# Patient Record
Sex: Female | Born: 1970
Health system: Southern US, Community
[De-identification: ages and names within clinical notes are randomized; demographics above are authoritative.]

## PROBLEM LIST (undated history)

## (undated) DIAGNOSIS — R112 Nausea with vomiting, unspecified: Secondary | ICD-10-CM

## (undated) DIAGNOSIS — N63 Unspecified lump in unspecified breast: Secondary | ICD-10-CM

## (undated) DIAGNOSIS — Z87442 Personal history of urinary calculi: Secondary | ICD-10-CM

## (undated) DIAGNOSIS — Z923 Personal history of irradiation: Secondary | ICD-10-CM

## (undated) DIAGNOSIS — Z9221 Personal history of antineoplastic chemotherapy: Secondary | ICD-10-CM

## (undated) DIAGNOSIS — C801 Malignant (primary) neoplasm, unspecified: Secondary | ICD-10-CM

## (undated) DIAGNOSIS — F32A Depression, unspecified: Secondary | ICD-10-CM

## (undated) DIAGNOSIS — M1990 Unspecified osteoarthritis, unspecified site: Secondary | ICD-10-CM

## (undated) DIAGNOSIS — Z9289 Personal history of other medical treatment: Secondary | ICD-10-CM

## (undated) DIAGNOSIS — Z9889 Other specified postprocedural states: Secondary | ICD-10-CM

## (undated) DIAGNOSIS — D649 Anemia, unspecified: Secondary | ICD-10-CM

## (undated) DIAGNOSIS — Z1379 Encounter for other screening for genetic and chromosomal anomalies: Secondary | ICD-10-CM

## (undated) HISTORY — PX: ABDOMINAL HYSTERECTOMY: SHX81

## (undated) HISTORY — PX: DILATION AND CURETTAGE OF UTERUS: SHX78

## (undated) HISTORY — DX: Depression, unspecified: F32.A

## (undated) HISTORY — DX: Encounter for other screening for genetic and chromosomal anomalies: Z13.79

## (undated) HISTORY — DX: Personal history of irradiation: Z92.3

## (undated) HISTORY — PX: PORT-A-CATH REMOVAL: SHX5289

## (undated) HISTORY — PX: SMALL INTESTINE SURGERY: SHX150

---

## 1994-02-04 HISTORY — PX: TUBAL LIGATION: SHX77

## 1997-08-17 ENCOUNTER — Encounter: Admission: RE | Admit: 1997-08-17 | Discharge: 1997-08-17 | Payer: Self-pay | Admitting: *Deleted

## 2003-12-01 ENCOUNTER — Emergency Department (HOSPITAL_COMMUNITY): Admission: EM | Admit: 2003-12-01 | Discharge: 2003-12-01 | Payer: Self-pay | Admitting: Emergency Medicine

## 2006-08-14 ENCOUNTER — Emergency Department (HOSPITAL_COMMUNITY): Admission: EM | Admit: 2006-08-14 | Discharge: 2006-08-14 | Payer: Self-pay | Admitting: Emergency Medicine

## 2006-08-16 ENCOUNTER — Emergency Department (HOSPITAL_COMMUNITY): Admission: EM | Admit: 2006-08-16 | Discharge: 2006-08-17 | Payer: Self-pay | Admitting: Emergency Medicine

## 2007-10-19 ENCOUNTER — Emergency Department (HOSPITAL_COMMUNITY): Admission: EM | Admit: 2007-10-19 | Discharge: 2007-10-19 | Payer: Self-pay | Admitting: Emergency Medicine

## 2009-04-17 ENCOUNTER — Encounter: Admission: RE | Admit: 2009-04-17 | Discharge: 2009-04-17 | Payer: Self-pay | Admitting: Family Medicine

## 2010-02-24 ENCOUNTER — Encounter: Payer: Self-pay | Admitting: Family Medicine

## 2011-03-19 ENCOUNTER — Other Ambulatory Visit (HOSPITAL_COMMUNITY)
Admission: RE | Admit: 2011-03-19 | Discharge: 2011-03-19 | Disposition: A | Discharge status: Home / Self Care | Payer: BC Managed Care – PPO | Source: Ambulatory Visit | Attending: Family Medicine | Admitting: Family Medicine

## 2011-03-19 DIAGNOSIS — Z1159 Encounter for screening for other viral diseases: Secondary | ICD-10-CM | POA: Insufficient documentation

## 2011-03-19 DIAGNOSIS — Z01419 Encounter for gynecological examination (general) (routine) without abnormal findings: Secondary | ICD-10-CM | POA: Insufficient documentation

## 2016-02-05 DIAGNOSIS — C50919 Malignant neoplasm of unspecified site of unspecified female breast: Secondary | ICD-10-CM

## 2016-02-05 HISTORY — PX: BREAST LUMPECTOMY: SHX2

## 2016-02-05 HISTORY — DX: Malignant neoplasm of unspecified site of unspecified female breast: C50.919

## 2016-03-05 ENCOUNTER — Encounter: Payer: Self-pay | Admitting: Nurse Practitioner

## 2016-03-05 ENCOUNTER — Ambulatory Visit (INDEPENDENT_AMBULATORY_CARE_PROVIDER_SITE_OTHER): Payer: PRIVATE HEALTH INSURANCE | Admitting: Nurse Practitioner

## 2016-03-05 ENCOUNTER — Other Ambulatory Visit: Payer: PRIVATE HEALTH INSURANCE

## 2016-03-05 ENCOUNTER — Other Ambulatory Visit (INDEPENDENT_AMBULATORY_CARE_PROVIDER_SITE_OTHER): Payer: PRIVATE HEALTH INSURANCE

## 2016-03-05 VITALS — BP 124/82 | HR 67 | Temp 98.3°F | Ht 66.5 in | Wt 206.0 lb

## 2016-03-05 DIAGNOSIS — D649 Anemia, unspecified: Secondary | ICD-10-CM

## 2016-03-05 DIAGNOSIS — N6320 Unspecified lump in the left breast, unspecified quadrant: Secondary | ICD-10-CM | POA: Diagnosis not present

## 2016-03-05 DIAGNOSIS — Z0001 Encounter for general adult medical examination with abnormal findings: Secondary | ICD-10-CM

## 2016-03-05 DIAGNOSIS — Z23 Encounter for immunization: Secondary | ICD-10-CM

## 2016-03-05 DIAGNOSIS — N920 Excessive and frequent menstruation with regular cycle: Secondary | ICD-10-CM

## 2016-03-05 DIAGNOSIS — R748 Abnormal levels of other serum enzymes: Secondary | ICD-10-CM | POA: Diagnosis not present

## 2016-03-05 LAB — CBC WITH DIFFERENTIAL/PLATELET
Basophils Absolute: 0 10*3/uL (ref 0.0–0.1)
Basophils Relative: 0.8 % (ref 0.0–3.0)
Eosinophils Absolute: 0.2 10*3/uL (ref 0.0–0.7)
Eosinophils Relative: 2.5 % (ref 0.0–5.0)
HCT: 24 % — ABNORMAL LOW (ref 36.0–46.0)
Hemoglobin: 6.4 g/dL — CL (ref 12.0–15.0)
Lymphocytes Relative: 26.4 % (ref 12.0–46.0)
Lymphs Abs: 1.6 10*3/uL (ref 0.7–4.0)
MCHC: 27.3 g/dL — ABNORMAL LOW (ref 30.0–36.0)
MCV: 50.3 fl — ABNORMAL LOW (ref 78.0–100.0)
Monocytes Absolute: 0.6 10*3/uL (ref 0.1–1.0)
Monocytes Relative: 9.7 % (ref 3.0–12.0)
Neutro Abs: 3.7 10*3/uL (ref 1.4–7.7)
Neutrophils Relative %: 60.6 % (ref 43.0–77.0)
Platelets: 245 10*3/uL (ref 150.0–400.0)
RBC: 4.68 Mil/uL (ref 3.87–5.11)
RDW: 24.6 % — ABNORMAL HIGH (ref 11.5–15.5)
WBC: 6.2 10*3/uL (ref 4.0–10.5)

## 2016-03-05 LAB — HEPATIC FUNCTION PANEL
ALK PHOS: 45 U/L (ref 39–117)
ALT: 95 U/L — ABNORMAL HIGH (ref 0–35)
AST: 82 U/L — ABNORMAL HIGH (ref 0–37)
Albumin: 3.7 g/dL (ref 3.5–5.2)
Bilirubin, Direct: 0.3 mg/dL (ref 0.0–0.3)
TOTAL PROTEIN: 7 g/dL (ref 6.0–8.3)
Total Bilirubin: 1.1 mg/dL (ref 0.2–1.2)

## 2016-03-05 LAB — ANEMIA PANEL
%SAT: 3 % — ABNORMAL LOW (ref 11–50)
ABS Retic: 81720 cells/uL — ABNORMAL HIGH (ref 20000–80000)
Iron: 12 ug/dL — ABNORMAL LOW (ref 40–190)
RBC.: 4.54 MIL/uL (ref 3.80–5.10)
Retic Ct Pct: 1.8 %
TIBC: 347 ug/dL (ref 250–450)
UIBC: 335 ug/dL (ref 125–400)

## 2016-03-05 LAB — BASIC METABOLIC PANEL
BUN: 11 mg/dL (ref 6–23)
CO2: 26 mEq/L (ref 19–32)
Calcium: 8.8 mg/dL (ref 8.4–10.5)
Chloride: 109 mEq/L (ref 96–112)
Creatinine, Ser: 0.62 mg/dL (ref 0.40–1.20)
GFR: 134.22 mL/min (ref >60.00)
Glucose, Bld: 96 mg/dL (ref 70–99)
Potassium: 3.8 mEq/L (ref 3.5–5.1)
Sodium: 140 mEq/L (ref 135–145)

## 2016-03-05 LAB — LIPID PANEL
Cholesterol: 101 mg/dL (ref 0–200)
HDL: 37.6 mg/dL — ABNORMAL LOW (ref >39.00)
LDL Cholesterol: 43 mg/dL (ref 0–99)
NONHDL: 63.29
Total CHOL/HDL Ratio: 3
Triglycerides: 101 mg/dL (ref 0.0–149.0)
VLDL: 20.2 mg/dL (ref 0.0–40.0)

## 2016-03-05 LAB — TSH: TSH: 4.39 u[IU]/mL (ref 0.35–4.50)

## 2016-03-05 MED ORDER — DOCUSATE SODIUM 100 MG PO CAPS
100.0000 mg | ORAL_CAPSULE | Freq: Two times a day (BID) | ORAL | 3 refills | Status: DC
Start: 2016-03-05 — End: 2016-04-03

## 2016-03-05 MED ORDER — FERROUS SULFATE 325 (65 FE) MG PO TBEC: 325.0000 mg | DELAYED_RELEASE_TABLET | Freq: Three times a day (TID) | ORAL | 3 refills | Status: DC

## 2016-03-05 MED ORDER — WOMENS MULTI VITAMIN & MINERAL PO TABS: 1.0000 | ORAL_TABLET | Freq: Every day | ORAL | 3 refills | Status: DC

## 2016-03-05 NOTE — Progress Notes (Signed)
Pre visit review using our clinic review tool, if applicable. No additional management support is needed unless otherwise documented below in the visit note. 

## 2016-03-05 NOTE — Progress Notes (Signed)
Subjective:    Patient ID: Susan Morrison, female    DOB: Jul 03, 1970, 46 y.o.   MRN: [id number]  Patient presents today for complete physical or establish care (new patient) and evaluation od left breast mass.  HPI  Left Breast Mass: She is concerned about growing left breast mass x 23months. When first noted, it was size of pea per patient, now its size of grape. FH of breast cancer (grandmother at 60).   Menorrhagia: Patient denies any symptoms related to anemia (SOB, chest pain, fatigue, palpitations, dizziness or syncope).  she states she has heavy menstrual cycles which requires use of tampon and heavy pad. She changes every 2hrs. She has had 3 D&C procedures in past. Last dosne 2years ago. Denies any hx of endometriosis or uterine fibroids.  Immunizations: (TDAP, Hep C screen, Pneumovax, Influenza, zoster)  Health Maintenance  Topic Date Due  . Pap Smear  09/29/1992  . HIV Screening  03/07/2017*  . Tetanus Vaccine  02/05/2019  . Flu Shot  Completed  *Topic was postponed. The date shown is not the original due date.   Diet:regular Weight:  Wt Readings from Last 3 Encounters:  03/05/16 206 lb (93.4 kg)    Exercise:none Fall Risk: Fall Risk  03/05/2016  Falls in the past year? No   Home Safety:home with husband and children Depression/Suicide: Depression screen [facility] 2/9 03/05/2016  Decreased Interest 0  Down, Depressed, Hopeless 0  PHQ - 2 Score 0   No flowsheet data found. Pap Smear (every 32yrs for >21-29 without HPV, every 35yrs for >30-65yrs with HPV):needed Vision:needed Dental:needed Sexual History (birth control, marital status, STD):married, sexually active, tubal [phone], 3 children  Medications and allergies reviewed with patient and updated if appropriate.  Patient Active Problem List   Diagnosis Date Noted  . Menorrhagia with regular cycle 03/05/2016  . Anemia 03/05/2016  . Left breast mass 03/05/2016    No current outpatient prescriptions on  file prior to visit.   No current facility-administered medications on file prior to visit.     History reviewed. No pertinent past medical history.  Past Surgical History:  Procedure Laterality Date  . DILATION AND CURETTAGE OF UTERUS     last done 2years ago  . TUBAL LIGATION  1996    Social History   Social History  . Marital status: Married    Spouse name: N/A  . Number of children: N/A  . Years of education: N/A   Social History Main Topics  . Smoking status: Never Smoker  . Smokeless tobacco: Never Used  . Alcohol use Yes     Comment: wine/social  . Drug use: No  . Sexual activity: Not Asked   Other Topics Concern  . None   Social History Narrative  . None    Family History  Problem Relation Age of Onset  . Hypertension Mother   . Diabetes Mother   . Cancer Paternal Grandmother 31    breast cancer        Review of Systems  Constitutional: Negative for fever, malaise/fatigue and weight loss.  HENT: Negative for congestion and sore throat.   Eyes:       Negative for visual changes  Respiratory: Negative for cough and shortness of breath.   Cardiovascular: Negative for chest pain, palpitations and leg swelling.  Gastrointestinal: Negative for blood in stool, constipation, diarrhea and heartburn.  Genitourinary: Negative for dysuria, frequency and urgency.  Musculoskeletal: Negative for falls, joint pain and myalgias.  Skin: Negative  for rash.  Neurological: Negative for dizziness, sensory change and headaches.  Endo/Heme/Allergies: Does not bruise/bleed easily.  Psychiatric/Behavioral: Negative for depression, substance abuse and suicidal ideas. The patient is not nervous/anxious.     Objective:   Vitals:   03/05/16 0922  BP: 124/82  Pulse: 67  Temp: 98.3 F (36.8 C)    Body mass index is 32.75 kg/m.   Physical Examination:  Physical Exam  Constitutional: She is oriented to person, place, and time and well-developed, well-nourished,  and in no distress. No distress.  HENT:  Right Ear: External ear normal.  Left Ear: External ear normal.  Nose: Nose normal.  Mouth/Throat: Oropharynx is clear and moist. No oropharyngeal exudate.  Eyes: Conjunctivae and EOM are normal. Pupils are equal, round, and reactive to light. No scleral icterus.  Neck: Normal range of motion. Neck supple. No thyromegaly present.  Cardiovascular: Normal rate, normal heart sounds and intact distal pulses.   Pulmonary/Chest: Effort normal and breath sounds normal. She exhibits no tenderness. Right breast exhibits mass. Right breast exhibits no inverted nipple, no nipple discharge, no skin change and no tenderness. Left breast exhibits mass. Left breast exhibits no inverted nipple, no nipple discharge, no skin change and no tenderness. Breasts are symmetrical.    Abdominal: Soft. Bowel sounds are normal. She exhibits no distension. There is no tenderness.  Musculoskeletal: Normal range of motion. She exhibits no edema or tenderness.  Lymphadenopathy:    She has no cervical adenopathy.  Neurological: She is alert and oriented to person, place, and time. Gait normal.  Skin: Skin is warm and dry.  Psychiatric: Affect and judgment normal.    ASSESSMENT and PLAN:  Susan Morrison was seen today for establish care.  Diagnoses and all orders for this visit:  Encounter for preventative adult health care exam with abnormal findings -     CBC w/Diff; Future -     Basic metabolic panel; Future -     Hepatic function panel; Future -     TSH; Future -     Lipid panel; Future  Left breast mass -     MM DIAG BREAST TOMO BILATERAL; Future -     Cancel: MM DIAG BREAST TOMO UNI LEFT; Future -     US BREAST LTD UNI LEFT INC AXILLA; Future  Encounter for immunization -     MM DIAG BREAST TOMO BILATERAL; Future -     US BREAST LTD UNI LEFT INC AXILLA; Future -     CBC w/Diff; Future -     Basic metabolic panel; Future -     Hepatic function panel; Future -      TSH; Future -     Lipid panel; Future -     Flu Vaccine QUAD 36+ mos IM -     Anemia panel; Future -     Ambulatory referral to Gynecology -     CBC w/Diff; Future -     ferrous sulfate 325 (65 FE) MG EC tablet; Take 1 tablet (325 mg total) by mouth 3 (three) times daily with meals. -     docusate sodium (COLACE) 100 MG capsule; Take 1 capsule (100 mg total) by mouth 2 (two) times daily. -     Multiple Vitamins-Minerals (WOMENS MULTI VITAMIN & MINERAL) TABS; Take 1 tablet by mouth daily after breakfast.  Anemia, unspecified type -     Anemia panel; Future -     Ambulatory referral to Gynecology -     CBC w/Diff;  Future -     ferrous sulfate 325 (65 FE) MG EC tablet; Take 1 tablet (325 mg total) by mouth 3 (three) times daily with meals. -     docusate sodium (COLACE) 100 MG capsule; Take 1 capsule (100 mg total) by mouth 2 (two) times daily. -     Multiple Vitamins-Minerals (WOMENS MULTI VITAMIN & MINERAL) TABS; Take 1 tablet by mouth daily after breakfast.  Menorrhagia with regular cycle -     Ambulatory referral to Gynecology  Elevated liver enzymes -     Hepatic function panel; Future   No problem-specific Assessment & Plan notes found for this encounter.    Recent Results (from the past 2160 hour(s))  CBC w/Diff     Status: Abnormal   Collection Time: 03/05/16  9:59 AM  Result Value Ref Range   WBC 6.2 4.0 - 10.5 K/uL   RBC 4.68 3.87 - 5.11 Mil/uL   Hemoglobin 6.4 Repeated and verified X2. (LL) 12.0 - 15.0 g/dL   HCT 24.0 (L) 36.0 - 46.0 %   MCV 50.3 (L) 78.0 - 100.0 fl   MCHC 27.3 (L) 30.0 - 36.0 g/dL   RDW 24.6 (H) 11.5 - 15.5 %   Platelets 245.0 150.0 - 400.0 K/uL   Neutrophils Relative % 60.6 43.0 - 77.0 %   Lymphocytes Relative 26.4 12.0 - 46.0 %   Monocytes Relative 9.7 3.0 - 12.0 %   Eosinophils Relative 2.5 0.0 - 5.0 %   Basophils Relative 0.8 0.0 - 3.0 %   Neutro Abs 3.7 1.4 - 7.7 K/uL   Lymphs Abs 1.6 0.7 - 4.0 K/uL   Monocytes Absolute 0.6 0.1 - 1.0 K/uL     Eosinophils Absolute 0.2 0.0 - 0.7 K/uL   Basophils Absolute 0.0 0.0 - 0.1 K/uL  Basic metabolic panel     Status: None   Collection Time: 03/05/16  9:59 AM  Result Value Ref Range   Sodium 140 135 - 145 mEq/L   Potassium 3.8 3.5 - 5.1 mEq/L   Chloride 109 96 - 112 mEq/L   CO2 26 19 - 32 mEq/L   Glucose, Bld 96 70 - 99 mg/dL   BUN 11 6 - 23 mg/dL   Creatinine, Ser 0.62 0.40 - 1.20 mg/dL   Calcium 8.8 8.4 - 10.5 mg/dL   GFR 134.22 >60.00 mL/min  Hepatic function panel     Status: Abnormal   Collection Time: 03/05/16  9:59 AM  Result Value Ref Range   Total Bilirubin 1.1 0.2 - 1.2 mg/dL   Bilirubin, Direct 0.3 0.0 - 0.3 mg/dL   Alkaline Phosphatase 45 39 - 117 U/L   AST 82 (H) 0 - 37 U/L   ALT 95 (H) 0 - 35 U/L   Total Protein 7.0 6.0 - 8.3 g/dL   Albumin 3.7 3.5 - 5.2 g/dL  TSH     Status: None   Collection Time: 03/05/16  9:59 AM  Result Value Ref Range   TSH 4.39 0.35 - 4.50 uIU/mL  Lipid panel     Status: Abnormal   Collection Time: 03/05/16  9:59 AM  Result Value Ref Range   Cholesterol 101 0 - 200 mg/dL    Comment: ATP III Classification       Desirable:  < 200 mg/dL               Borderline High:  200 - 239 mg/dL          High:  > =  240 mg/dL   Triglycerides 101.0 0.0 - 149.0 mg/dL    Comment: Normal:  <150 mg/dLBorderline High:  150 - 199 mg/dL   HDL 37.60 (L) >39.00 mg/dL   VLDL 20.2 0.0 - 40.0 mg/dL   LDL Cholesterol 43 0 - 99 mg/dL   Total CHOL/HDL Ratio 3     Comment:                Men          Women1/2 Average Risk     3.4          3.3Average Risk          5.0          4.42X Average Risk          9.6          7.13X Average Risk          15.0          11.0                       NonHDL 63.29     Comment: NOTE:  Non-HDL goal should be 30 mg/dL higher than patient's LDL goal (i.e. LDL goal of < 70 mg/dL, would have non-HDL goal of < 100 mg/dL)   Follow up: Return in about 3 months (around 06/03/2016) for left breast mass and anemia.  Wilfred Lacy, NP

## 2016-03-05 NOTE — Patient Instructions (Addendum)
You will be called with breast US and mammogram.  She also denies going for 1unit of PRBCs as this time. She will like to use ferrous sulfate supplement (prescription sent) and iron rich diet at this time. She will return to lab in 2weeks for repeat CBC. She is to return to office sooner if develops any symptoms. She verbalized understanding and agreed with above plan.  Anemia, Nonspecific Anemia is a condition in which the concentration of red blood cells or hemoglobin in the blood is below normal. Hemoglobin is a substance in red blood cells that carries oxygen to the tissues of the body. Anemia results in not enough oxygen reaching these tissues. What are the causes? Common causes of anemia include:  Excessive bleeding. Bleeding may be internal or external. This includes excessive bleeding from periods (in women) or from the intestine.  Poor nutrition.  Chronic kidney, thyroid, and liver disease.  Bone marrow disorders that decrease red blood cell production.  Cancer and treatments for cancer.  HIV, AIDS, and their treatments.  Spleen problems that increase red blood cell destruction.  Blood disorders.  Excess destruction of red blood cells due to infection, medicines, and autoimmune disorders. What are the signs or symptoms?  Minor weakness.  Dizziness.  Headache.  Palpitations.  Shortness of breath, especially with exercise.  Paleness.  Cold sensitivity.  Indigestion.  Nausea.  Difficulty sleeping.  Difficulty concentrating. Symptoms may occur suddenly or they may develop slowly. How is this diagnosed? Additional blood tests are often needed. These help your health care provider determine the best treatment. Your health care provider will check your stool for blood and look for other causes of blood loss. How is this treated? Treatment varies depending on the cause of the anemia. Treatment can include:  Supplements of iron, vitamin 123456, or folic  acid.  Hormone medicines.  A blood transfusion. This may be needed if blood loss is severe.  Hospitalization. This may be needed if there is significant continual blood loss.  Dietary changes.  Spleen removal. Follow these instructions at home: Keep all follow-up appointments. It often takes many weeks to correct anemia, and having your health care provider check on your condition and your response to treatment is very important. Get help right away if:  You develop extreme weakness, shortness of breath, or chest pain.  You become dizzy or have trouble concentrating.  You develop heavy vaginal bleeding.  You develop a rash.  You have bloody or black, tarry stools.  You faint.  You vomit up blood.  You vomit repeatedly.  You have abdominal pain.  You have a fever or persistent symptoms for more than 2-3 days.  You have a fever and your symptoms suddenly get worse.  You are dehydrated. This information is not intended to replace advice given to you by your health care provider. Make sure you discuss any questions you have with your health care provider. Document Released: 02/29/2004 Document Revised: 07/05/2015 Document Reviewed: 07/17/2012 Elsevier Interactive Patient Education  2017 [employer].

## 2016-03-08 ENCOUNTER — Other Ambulatory Visit: Payer: PRIVATE HEALTH INSURANCE

## 2016-03-11 ENCOUNTER — Other Ambulatory Visit (HOSPITAL_COMMUNITY): Payer: Self-pay | Admitting: *Deleted

## 2016-03-11 DIAGNOSIS — N6320 Unspecified lump in the left breast, unspecified quadrant: Secondary | ICD-10-CM

## 2016-03-21 ENCOUNTER — Other Ambulatory Visit (HOSPITAL_COMMUNITY): Payer: Self-pay | Admitting: Obstetrics and Gynecology

## 2016-03-21 ENCOUNTER — Ambulatory Visit
Admission: RE | Admit: 2016-03-21 | Discharge: 2016-03-21 | Disposition: A | Discharge status: Home / Self Care | Payer: No Typology Code available for payment source | Source: Ambulatory Visit | Attending: Obstetrics and Gynecology | Admitting: Obstetrics and Gynecology

## 2016-03-21 ENCOUNTER — Encounter (HOSPITAL_COMMUNITY): Payer: Self-pay | Admitting: *Deleted

## 2016-03-21 ENCOUNTER — Ambulatory Visit (HOSPITAL_COMMUNITY)
Admission: RE | Admit: 2016-03-21 | Discharge: 2016-03-21 | Disposition: A | Discharge status: Home / Self Care | Payer: Medicaid Other | Source: Ambulatory Visit | Attending: Obstetrics and Gynecology | Admitting: Obstetrics and Gynecology

## 2016-03-21 VITALS — BP 118/82 | Temp 99.1°F | Ht 66.5 in | Wt 206.6 lb

## 2016-03-21 DIAGNOSIS — N6320 Unspecified lump in the left breast, unspecified quadrant: Secondary | ICD-10-CM

## 2016-03-21 DIAGNOSIS — R599 Enlarged lymph nodes, unspecified: Secondary | ICD-10-CM

## 2016-03-21 DIAGNOSIS — N6323 Unspecified lump in the left breast, lower outer quadrant: Secondary | ICD-10-CM | POA: Insufficient documentation

## 2016-03-21 DIAGNOSIS — N63 Unspecified lump in unspecified breast: Secondary | ICD-10-CM

## 2016-03-21 DIAGNOSIS — Z1239 Encounter for other screening for malignant neoplasm of breast: Secondary | ICD-10-CM

## 2016-03-21 HISTORY — DX: Unspecified lump in unspecified breast: N63.0

## 2016-03-21 NOTE — Progress Notes (Signed)
Complaints of left breast lump since October that has increased in size.  Pap Smear:  Pap smear not completed today. Last Pap smear was in April 2016 at the [facility] and normal per patient. Per patient has no history of an abnormal Pap smear. No Pap smear results are in EPIC.  Physical exam: Breasts Breasts symmetrical. No skin abnormalities bilateral breasts. No nipple retraction bilateral breasts. No nipple discharge bilateral breasts. No lymphadenopathy. No lumps palpated bright breast. Palpated a lump within the left breast between 7 and 9 o'clock. No complaints of pain or tenderness on exam. Referred patient to the [facility] for diagnostic mammogram and possible left breast ultrasound. Appointment scheduled for Thursday, Februrary 15, 2018 at 1500.        Pelvic/Bimanual No Pap smear completed today since last Pap smear was in April 2016 per patient. Pap smear not indicated per BCCCP guidelines.   Smoking History: Patient has never smoked.  Patient Navigation: Patient education provided. Access to services provided for patient through [facility] program.

## 2016-03-21 NOTE — Patient Instructions (Signed)
Explained breast self awareness with [name]. Patient did not need a Pap smear today due to last Pap smear was in April 2016 per patient. Let her know BCCCP will cover Pap smears every 3 years unless has a history of abnormal Pap smears. Referred patient to the [facility] for diagnostic mammogram and possible left breast ultrasound. Appointment scheduled for Thursday, Februrary 15, 2018 at 1500. [name] verbalized understanding.  Roshelle Traub, Arvil Chaco, RN 3:11 PM

## 2016-03-22 ENCOUNTER — Ambulatory Visit
Admission: RE | Admit: 2016-03-22 | Discharge: 2016-03-22 | Disposition: A | Discharge status: Home / Self Care | Payer: No Typology Code available for payment source | Source: Ambulatory Visit | Attending: Obstetrics and Gynecology | Admitting: Obstetrics and Gynecology

## 2016-03-22 ENCOUNTER — Encounter (HOSPITAL_COMMUNITY): Payer: Self-pay | Admitting: *Deleted

## 2016-03-22 DIAGNOSIS — R599 Enlarged lymph nodes, unspecified: Secondary | ICD-10-CM

## 2016-03-22 DIAGNOSIS — N6320 Unspecified lump in the left breast, unspecified quadrant: Secondary | ICD-10-CM

## 2016-03-26 ENCOUNTER — Telehealth: Payer: Self-pay | Admitting: *Deleted

## 2016-03-26 DIAGNOSIS — Z171 Estrogen receptor negative status [ER-]: Secondary | ICD-10-CM | POA: Insufficient documentation

## 2016-03-26 DIAGNOSIS — C50312 Malignant neoplasm of lower-inner quadrant of left female breast: Secondary | ICD-10-CM | POA: Insufficient documentation

## 2016-03-26 NOTE — Telephone Encounter (Signed)
Confirmed [facility] for 04/03/16 at 815am .  Instructions and contact information given.

## 2016-03-27 ENCOUNTER — Telehealth: Payer: Self-pay | Admitting: *Deleted

## 2016-03-27 NOTE — Telephone Encounter (Signed)
Mailed [facility] packet to pt. 

## 2016-04-03 ENCOUNTER — Ambulatory Visit (HOSPITAL_BASED_OUTPATIENT_CLINIC_OR_DEPARTMENT_OTHER): Payer: Medicaid Other | Admitting: Hematology and Oncology

## 2016-04-03 ENCOUNTER — Encounter: Payer: Self-pay | Admitting: *Deleted

## 2016-04-03 ENCOUNTER — Ambulatory Visit
Admission: RE | Admit: 2016-04-03 | Discharge: 2016-04-03 | Disposition: A | Discharge status: Home / Self Care | Payer: Medicaid Other | Source: Ambulatory Visit | Attending: Radiation Oncology | Admitting: Radiation Oncology

## 2016-04-03 ENCOUNTER — Telehealth: Payer: Self-pay | Admitting: Hematology and Oncology

## 2016-04-03 ENCOUNTER — Other Ambulatory Visit: Payer: Self-pay | Admitting: Hematology and Oncology

## 2016-04-03 ENCOUNTER — Encounter: Payer: Self-pay | Admitting: Hematology and Oncology

## 2016-04-03 ENCOUNTER — Ambulatory Visit: Payer: Medicaid Other | Attending: Surgery | Admitting: Physical Therapy

## 2016-04-03 ENCOUNTER — Encounter: Payer: Self-pay | Admitting: Physical Therapy

## 2016-04-03 ENCOUNTER — Other Ambulatory Visit: Payer: Self-pay | Admitting: Surgery

## 2016-04-03 ENCOUNTER — Other Ambulatory Visit (HOSPITAL_BASED_OUTPATIENT_CLINIC_OR_DEPARTMENT_OTHER): Payer: Medicaid Other

## 2016-04-03 VITALS — BP 138/75 | HR 84 | Temp 98.2°F | Resp 18 | Ht 66.5 in | Wt 206.4 lb

## 2016-04-03 DIAGNOSIS — C773 Secondary and unspecified malignant neoplasm of axilla and upper limb lymph nodes: Secondary | ICD-10-CM

## 2016-04-03 DIAGNOSIS — C50312 Malignant neoplasm of lower-inner quadrant of left female breast: Secondary | ICD-10-CM

## 2016-04-03 DIAGNOSIS — Z171 Estrogen receptor negative status [ER-]: Secondary | ICD-10-CM

## 2016-04-03 DIAGNOSIS — Z803 Family history of malignant neoplasm of breast: Secondary | ICD-10-CM | POA: Diagnosis not present

## 2016-04-03 DIAGNOSIS — R293 Abnormal posture: Secondary | ICD-10-CM

## 2016-04-03 DIAGNOSIS — D500 Iron deficiency anemia secondary to blood loss (chronic): Secondary | ICD-10-CM | POA: Insufficient documentation

## 2016-04-03 LAB — COMPREHENSIVE METABOLIC PANEL
ALK PHOS: 54 U/L (ref 40–150)
ALT: 106 U/L — AB (ref 0–55)
ANION GAP: 9 meq/L (ref 3–11)
AST: 87 U/L — ABNORMAL HIGH (ref 5–34)
Albumin: 3.6 g/dL (ref 3.5–5.0)
BILIRUBIN TOTAL: 1.43 mg/dL — AB (ref 0.20–1.20)
BUN: 10.1 mg/dL (ref 7.0–26.0)
CALCIUM: 9.1 mg/dL (ref 8.4–10.4)
CO2: 24 mEq/L (ref 22–29)
CREATININE: 0.8 mg/dL (ref 0.6–1.1)
Chloride: 107 mEq/L (ref 98–109)
Glucose: 102 mg/dl (ref 70–140)
Potassium: 4.1 mEq/L (ref 3.5–5.1)
Sodium: 140 mEq/L (ref 136–145)
Total Protein: 7.3 g/dL (ref 6.4–8.3)

## 2016-04-03 LAB — CBC WITH DIFFERENTIAL/PLATELET
BASO%: 0.3 % (ref 0.0–2.0)
BASOS ABS: 0 10*3/uL (ref 0.0–0.1)
EOS ABS: 0.2 10*3/uL (ref 0.0–0.5)
EOS%: 2.9 % (ref 0.0–7.0)
HEMATOCRIT: 27 % — AB (ref 34.8–46.6)
HGB: 7.3 g/dL — ABNORMAL LOW (ref 11.6–15.9)
LYMPH%: 23.6 % (ref 14.0–49.7)
MCH: 14.9 pg — ABNORMAL LOW (ref 25.1–34.0)
MCHC: 27 g/dL — ABNORMAL LOW (ref 31.5–36.0)
MCV: 55 fL — AB (ref 79.5–101.0)
MONO#: 0.7 10*3/uL (ref 0.1–0.9)
MONO%: 11.3 % (ref 0.0–14.0)
NEUT%: 61.9 % (ref 38.4–76.8)
NEUTROS ABS: 3.6 10*3/uL (ref 1.5–6.5)
NRBC: 1 % — AB (ref 0–0)
PLATELETS: 431 10*3/uL — AB (ref 145–400)
RBC: 4.91 10*6/uL (ref 3.70–5.45)
RDW: 23.8 % — AB (ref 11.2–14.5)
WBC: 5.8 10*3/uL (ref 3.9–10.3)
lymph#: 1.4 10*3/uL (ref 0.9–3.3)

## 2016-04-03 LAB — TECHNOLOGIST REVIEW

## 2016-04-03 MED ORDER — PROCHLORPERAZINE MALEATE 10 MG PO TABS: 10.0000 mg | ORAL_TABLET | Freq: Four times a day (QID) | ORAL | 1 refills | Status: DC | PRN

## 2016-04-03 MED ORDER — LIDOCAINE-PRILOCAINE 2.5-2.5 % EX CREA: TOPICAL_CREAM | CUTANEOUS | 3 refills | Status: DC

## 2016-04-03 MED ORDER — DEXAMETHASONE 4 MG PO TABS: 4.0000 mg | ORAL_TABLET | Freq: Every day | ORAL | 1 refills | Status: AC

## 2016-04-03 MED ORDER — LORAZEPAM 0.5 MG PO TABS: 0.5000 mg | ORAL_TABLET | Freq: Four times a day (QID) | ORAL | 0 refills | Status: DC | PRN

## 2016-04-03 MED ORDER — ONDANSETRON HCL 8 MG PO TABS: 8.0000 mg | ORAL_TABLET | Freq: Two times a day (BID) | ORAL | 1 refills | Status: DC | PRN

## 2016-04-03 NOTE — Progress Notes (Signed)
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 female who self palpated a lump in the left breast since October 2017. She had a diagnostic bilateral mammogram on 03/21/16. This showed an irregular mass in the LIQ of the left breast posterior depth and a partially imaged mass in the left axilla. Ultrasound of the left breast revealed an irregular hypoechoic mass in the 7:30 position 4 cm from the nipple measuring 3.5 x 2.2 x 2.6 cm corresponding to the palpable lump and mammographic finding. Ultrasound of the left axilla showed an enlarged/abnormal lymph node measuring 2.8 x 2.4 cm corresponding to the mammographic finding. There was no malignancies noted in the right breast. Biopsies were obtained on 03/22/16. Biopsy of the left breast 8:00 position revealed high grade IDC with necrosis.(triple negative, Ki67 90%) Biopsy of a left axillary lymph node revealed IDC . The patient presents today in multidisciplinary breast clinic to discuss treatment options for the management of her disease.  Gynecologic History  Age at first menstrual period? 13  Are you still having periods? Yes Approximate date of last period? 03/28/16 - 04/02/26  If you are still having periods: Are your periods regular? No Obstetric History:  How many children have you carried to term? 3 Your age at first live birth? 85  Pregnant now or trying to get pregnant? No  Have you used birth control pills or hormone shots for contraception? No  Would you be interested in learning more about the options to preserve fertility? No Health  Maintenance:  Have you ever had a colonoscopy? No  Have you ever had a bone density? No  Date of your last PAP smear? 05/2014 Date of your FIRST mammogram? 03/21/16  PREVIOUS RADIATION THERAPY: No  PAST MEDICAL HISTORY:  has a past medical history of Breast mass (03/21/2016).    PAST SURGICAL HISTORY: Past Surgical History:  Procedure Laterality Date  . DILATION AND CURETTAGE OF UTERUS     last done 2years ago  . TUBAL LIGATION  1996    FAMILY HISTORY: family history includes Cancer (age of onset: 30) in her paternal grandmother; Diabetes in her mother; Hypertension in her mother.  SOCIAL HISTORY:  reports that she has never smoked. She has never used smokeless tobacco. She reports that she drinks alcohol. She reports that she does not use drugs.  ALLERGIES: Patient has no known allergies.  MEDICATIONS:  Current Outpatient Prescriptions  Medication Sig Dispense Refill  . Multiple Vitamins-Minerals (WOMENS MULTI VITAMIN & MINERAL) TABS Take 1 tablet by mouth daily after breakfast. 30 tablet 3   No current facility-administered medications for this encounter.     REVIEW OF SYSTEMS: On review of systems the patient reports night sweats, weight change, loss of sleep, left breast and back pain, poor circulation, sleeps on more than one pillow, poor appetite, a left breast lump, arthritis, and hot flashes. The patient denies issues with her eyes, ENT, bowels, urine, skin, nervous system, mental health, blood/lymph system, or immune system. Pertinent items noted in HPI and remainder of comprehensive ROS otherwise negative.   PHYSICAL EXAM:  Vitals with BMI 04/03/2016  Height  5' 6.5"  Weight 206 lbs 6 oz  BMI 17.4  Systolic 944  Diastolic 75  Pulse 84  Respirations 18  General: Alert and oriented, in no acute distress HEENT: Head is normocephalic. Extraocular movements are intact. Oropharynx is clear. Neck: Neck is supple, no palpable cervical or supraclavicular  lymphadenopathy. Heart: Regular in rate and rhythm with no murmurs, rubs, or gallops. Chest: Clear to auscultation bilaterally, with no rhonchi, wheezes, or rales. Extremities: No cyanosis or edema. Lymphatics: see Neck Exam. The patient has a 1.5 x 2.0 cm palpable lymph node in the left axilla. Skin: No concerning lesions. Musculoskeletal: symmetric strength and muscle tone throughout. Neurologic: Cranial nerves II through XII are grossly intact. No obvious focalities. Speech is fluent. Coordination is intact. Psychiatric: Judgment and insight are intact. Affect is appropriate. Breast: Right breast no palpable mass or nipple discharge. Left breast 3.5 x 3.5 cm mass in the approximately 8:00 position of the breast, best appreciated when the patient is recumbent.  ECOG = 1   LABORATORY DATA:  Lab Results  Component Value Date   WBC 5.8 04/03/2016   HGB 7.3 (L) 04/03/2016   HCT 27.0 (L) 04/03/2016   MCV 55.0 (L) 04/03/2016   PLT 431 (H) 04/03/2016   NEUTROABS 3.6 04/03/2016   Lab Results  Component Value Date   NA 140 04/03/2016   K 4.1 04/03/2016   CL 109 03/05/2016   CO2 24 04/03/2016   GLUCOSE 102 04/03/2016   CREATININE 0.8 04/03/2016   CALCIUM 9.1 04/03/2016      RADIOGRAPHY: US Breast Ltd Uni Left Inc Axilla  Result Date: 03/21/2016 CLINICAL DATA:  Left breast lump since October of 2017, enlarging per patient. This is patient's baseline mammogram. EXAM: 2D DIGITAL DIAGNOSTIC BILATERAL MAMMOGRAM WITH CAD AND ADJUNCT TOMO ULTRASOUND RIGHT BREAST COMPARISON:  None. ACR Breast Density Category c: The breast tissue is heterogeneously dense, which may obscure small masses. FINDINGS: Bilateral 2D CC and MLO projections were obtained today, with additional 3D tomosynthesis, and with additional spot compression view of the area of clinical concern with overlying skin marker in place. There is a partially imaged irregular mass within the lower inner quadrant of the left breast, at far  posterior depth. There are no additional masses, suspicious calcifications or secondary signs of malignancy within either breast. Also noted is a partially imaged mass within the left axilla. Mammographic images were processed with CAD. Targeted ultrasound is performed, showing an irregular hypoechoic mass within the left breast at the 7:30 o'clock axis, 4 cm from the nipple, measuring 3.5 x 2.2 x 2.6 cm, with internal vascularity, corresponding to the palpable lump and mammographic finding. Left axilla was evaluated with ultrasound showing an enlarged/morphologically abnormal lymph node measuring 2.8 x 2.4 cm, corresponding to the mammographic finding. Additional mildly prominent lymph nodes are noted within the left axilla, none of which appear pathologic by size or morphology criteria. IMPRESSION: 1. Irregular mass within the LEFT breast at the 7:30 o'clock axis, 4 cm from the nipple, at posterior depth, measuring 3.5 x 2.2 x 2.6 cm, corresponding to the palpable lump and mammographic finding. This is a highly suspicious finding for which ultrasound-guided biopsy is recommended. 2. Enlarged/morphologically abnormal lymph node in the LEFT axilla. This is also a highly suspicious finding for which ultrasound-guided biopsy is recommended. 3. No evidence of malignancy within the right breast. RECOMMENDATION: 1. Ultrasound-guided biopsy of the LEFT breast mass at the 7:30 o'clock axis. 2. Ultrasound-guided biopsy of the enlarged lymph node  in the LEFT axilla. Ultrasound-guided biopsies are scheduled for 03/22/2016 at 7:30 a.m. I have discussed the findings and recommendations with the patient. Results were also provided in writing at the conclusion of the visit. If applicable, a reminder letter will be sent to the patient regarding the next appointment. BI-RADS CATEGORY  5: Highly suggestive of malignancy. Electronically Signed   By: Franki Cabot M.D.   On: 03/21/2016 15:44   Mm Diag Breast Tomo Bilateral  Result  Date: 03/21/2016 CLINICAL DATA:  Left breast lump since October of 2017, enlarging per patient. This is patient's baseline mammogram. EXAM: 2D DIGITAL DIAGNOSTIC BILATERAL MAMMOGRAM WITH CAD AND ADJUNCT TOMO ULTRASOUND RIGHT BREAST COMPARISON:  None. ACR Breast Density Category c: The breast tissue is heterogeneously dense, which may obscure small masses. FINDINGS: Bilateral 2D CC and MLO projections were obtained today, with additional 3D tomosynthesis, and with additional spot compression view of the area of clinical concern with overlying skin marker in place. There is a partially imaged irregular mass within the lower inner quadrant of the left breast, at far posterior depth. There are no additional masses, suspicious calcifications or secondary signs of malignancy within either breast. Also noted is a partially imaged mass within the left axilla. Mammographic images were processed with CAD. Targeted ultrasound is performed, showing an irregular hypoechoic mass within the left breast at the 7:30 o'clock axis, 4 cm from the nipple, measuring 3.5 x 2.2 x 2.6 cm, with internal vascularity, corresponding to the palpable lump and mammographic finding. Left axilla was evaluated with ultrasound showing an enlarged/morphologically abnormal lymph node measuring 2.8 x 2.4 cm, corresponding to the mammographic finding. Additional mildly prominent lymph nodes are noted within the left axilla, none of which appear pathologic by size or morphology criteria. IMPRESSION: 1. Irregular mass within the LEFT breast at the 7:30 o'clock axis, 4 cm from the nipple, at posterior depth, measuring 3.5 x 2.2 x 2.6 cm, corresponding to the palpable lump and mammographic finding. This is a highly suspicious finding for which ultrasound-guided biopsy is recommended. 2. Enlarged/morphologically abnormal lymph node in the LEFT axilla. This is also a highly suspicious finding for which ultrasound-guided biopsy is recommended. 3. No evidence of  malignancy within the right breast. RECOMMENDATION: 1. Ultrasound-guided biopsy of the LEFT breast mass at the 7:30 o'clock axis. 2. Ultrasound-guided biopsy of the enlarged lymph node in the LEFT axilla. Ultrasound-guided biopsies are scheduled for 03/22/2016 at 7:30 a.m. I have discussed the findings and recommendations with the patient. Results were also provided in writing at the conclusion of the visit. If applicable, a reminder letter will be sent to the patient regarding the next appointment. BI-RADS CATEGORY  5: Highly suggestive of malignancy. Electronically Signed   By: Franki Cabot M.D.   On: 03/21/2016 15:44   Mm Clip Placement Left  Result Date: 03/22/2016 CLINICAL DATA:  Status post ultrasound-guided core biopsy of a mass in the 8 o'clock location of the left breast and enlarged left axillary lymph node. EXAM: DIAGNOSTIC LEFT MAMMOGRAM POST ULTRASOUND BIOPSY x2 COMPARISON:  Previous exam(s). FINDINGS: Mammographic images were obtained following ultrasound guided biopsy of mass in the 8 o'clock location of the left breast and placement of a ribbon shaped clip. A ribbon shaped clip is identified within the mass in the lower inner quadrant of the left breast. Following biopsy of enlarged left axillary lymph node, a spiral shaped clip was placed. Images demonstrate post biopsy changes in the left axilla but fail to demonstrate the clip given  the deep location of the enlarged node. IMPRESSION: 1. Tissue marker clip well seen within the lower inner quadrant of the left breast. 2. Axillary clip is not imaged mammographically. Final Assessment: Post Procedure Mammograms for Marker Placement Electronically Signed   By: Nolon Nations M.D.   On: 03/22/2016 08:39   [place] Lt Breast Bx W Loc Dev 1st Lesion Img Bx Spec US Guide  Addendum Date: 04/01/2016   ADDENDUM REPORT: 03/26/2016 08:38 ADDENDUM: Pathology revealed INVASIVE DUCTAL CARCINOMA, HIGH GRADE, WITH NECROSIS of the Left breast, 8:00 o'clock.  INVASIVE DUCTAL CARCINOMA METASTATIC TO LYMPH NODE of the Left axilla. This was found to be concordant by Dr. Nolon Nations. Pathology results were discussed with the patient by telephone. The patient reported doing well after the biopsies with tenderness at the sites. Post biopsy instructions and care were reviewed and questions were answered. The patient was encouraged to call [facility] for any additional concerns. The patient was referred to [facility] on April 03, 2016. Pathology results reported by Terie Purser, RN on 03/26/2016. Electronically Signed   By: Nolon Nations M.D.   On: 03/26/2016 08:38   Result Date: 04/01/2016 CLINICAL DATA:  Patient presents for ultrasound-guided core biopsy of a left breast and enlarged left axillary lymph node. EXAM: ULTRASOUND GUIDED LEFT BREAST CORE NEEDLE BIOPSY x2 COMPARISON:  Previous exam(s). FINDINGS: I met with the patient and we discussed the procedure of ultrasound-guided biopsy, including benefits and alternatives. We discussed the high likelihood of a successful procedure. We discussed the risks of the procedure, including infection, bleeding, tissue injury, clip migration, and inadequate sampling. Informed written consent was given. The usual time-out protocol was performed immediately prior to the procedure. Using sterile technique and 1% Lidocaine as local anesthetic, under direct ultrasound visualization, a 12 gauge spring-loaded device was used to perform biopsy of mass in the 8 o'clock location of the left breast using a lateral approach. At the conclusion of the procedure a ribbon shaped tissue marker clip was deployed into the biopsy cavity. Using the same technique, a 14 gauge spring-loaded device was used to perform biopsy of mass in the lower left axilla using a lateral approach. At the conclusion of procedure a HydroMARK spiral shaped  tissue marker clip was deployed into the biopsy cavity. Follow up 2 view mammogram was performed and dictated separately. IMPRESSION: Ultrasound guided biopsy of left breast mass and enlarged left axillary lymph node. No apparent complications. Electronically Signed: By: Nolon Nations M.D. On: 03/22/2016 08:33   [place] Lt Breast Bx W Loc Dev Ea Add Lesion Img Bx Spec US Guide  Addendum Date: 04/01/2016   ADDENDUM REPORT: 03/26/2016 08:38 ADDENDUM: Pathology revealed INVASIVE DUCTAL CARCINOMA, HIGH GRADE, WITH NECROSIS of the Left breast, 8:00 o'clock. INVASIVE DUCTAL CARCINOMA METASTATIC TO LYMPH NODE of the Left axilla. This was found to be concordant by Dr. Nolon Nations. Pathology results were discussed with the patient by telephone. The patient reported doing well after the biopsies with tenderness at the sites. Post biopsy instructions and care were reviewed and questions were answered. The patient was encouraged to call [facility] for any additional concerns. The patient was referred to [facility] at [facility] on April 03, 2016. Pathology results reported by Terie Purser, RN on 03/26/2016. Electronically Signed   By: Nolon Nations M.D.   On: 03/26/2016 08:38  Result Date: 04/01/2016 CLINICAL DATA:  Patient presents for ultrasound-guided core biopsy of a left breast and enlarged left axillary lymph node. EXAM: ULTRASOUND GUIDED LEFT BREAST CORE NEEDLE BIOPSY x2 COMPARISON:  Previous exam(s). FINDINGS: I met with the patient and we discussed the procedure of ultrasound-guided biopsy, including benefits and alternatives. We discussed the high likelihood of a successful procedure. We discussed the risks of the procedure, including infection, bleeding, tissue injury, clip migration, and inadequate sampling. Informed written consent was given. The usual time-out protocol was performed immediately prior to the  procedure. Using sterile technique and 1% Lidocaine as local anesthetic, under direct ultrasound visualization, a 12 gauge spring-loaded device was used to perform biopsy of mass in the 8 o'clock location of the left breast using a lateral approach. At the conclusion of the procedure a ribbon shaped tissue marker clip was deployed into the biopsy cavity. Using the same technique, a 14 gauge spring-loaded device was used to perform biopsy of mass in the lower left axilla using a lateral approach. At the conclusion of procedure a HydroMARK spiral shaped tissue marker clip was deployed into the biopsy cavity. Follow up 2 view mammogram was performed and dictated separately. IMPRESSION: Ultrasound guided biopsy of left breast mass and enlarged left axillary lymph node. No apparent complications. Electronically Signed: By: Nolon Nations M.D. 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 Radiation to the left breast and left axilla would be given post-surgically to reduce the risk of recurrence.  I spoke to the patient today regarding her diagnosis and options for treatment. We discussed the equivalence in terms of survival and local failure between mastectomy and breast conservation. We discussed the role of radiation in decreasing local failures in patients who undergo lumpectomy. We discussed the process of simulation and the placement tattoos. We discussed 4-6 weeks of treatment as an outpatient. We discussed the possibility of asymptomatic lung damage. We discussed the low likelihood of secondary malignancies. We discussed the possible side effects including but not limited to skin redness, fatigue, permanent skin darkening, and breast swelling.  We discussed the use of cardiac sparing with  deep inspiration breath hold if needed.  PLAN: She will undergo a bilateral breast MRI to observe the extent of disease and this is scheduled on 04/05/16. The patient will be referred to genetic counseling given her young age and having triple negative disease and this is scheduled on 05/15/16. She will have an echocardiogram, chemo class, and placement of a port. The patient will then undergo neoadjuvant chemotherapy to shrink the size of the tumor and lymphadenopathy. Surgery will be determined based on the results of neoadjuvant chemotherapy. The patient will see me after surgery to further discuss radiation to the left breast and left axilla.     ------------------------------------------------  Blair Promise, PhD, MD  This document serves as a record of services personally performed by Gery Pray, MD. It was created on his behalf by Darcus Austin, a trained medical scribe. The creation of this record is based on the scribe's personal observations and the provider's statements to them. This document has been checked and approved by the attending provider.

## 2016-04-03 NOTE — Patient Instructions (Signed)

## 2016-04-03 NOTE — Therapy (Signed)
[place], [place], [address] Phone: [phone]   Fax:  [phone]  Physical Therapy Evaluation  Patient Details  Name: Susan Morrison MRN: [id number] Date of Birth: April 07, 1970 Referring Provider: Dr. Alphonsa Overall  Encounter Date: 04/03/2016      PT End of Session - 04/03/16 1201    Visit Number 1   Number of Visits 1   PT Start Time 8329   PT Stop Time 1916  Also saw pt from [phone] for a total of 21 minutes   PT Time Calculation (min) 7 min   Activity Tolerance Patient tolerated treatment well   Behavior During Therapy [facility] for tasks assessed/performed      Past Medical History:  Diagnosis Date  . Breast mass 03/21/2016   left 7:00    Past Surgical History:  Procedure Laterality Date  . DILATION AND CURETTAGE OF UTERUS     last done 2years ago  . TUBAL LIGATION  1996    There were no vitals filed for this visit.       Subjective Assessment - 04/03/16 1126    Subjective Patient reports she is here today to be seen by her medical team for her newly diagnosed left breast cancer.   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 It measures 3.5 cm and is located in the lower inner quadrant. The Ki67 is 90% and she has a positive axillary lymph node.   Patient Stated Goals Reduce lymphedema risk and learn post op shoulder ROM HEP   Currently in Pain? No/denies            [facility] PT Assessment - 04/03/16 0001      Assessment   Medical Diagnosis Left breast cancer   Referring Provider Dr. Alphonsa Overall   Onset Date/Surgical Date 03/21/16   Hand Dominance Right   Prior Therapy none     Precautions   Precautions Other (comment)   Precaution Comments active cancer     Restrictions   Weight Bearing Restrictions No     Balance Screen   Has the patient fallen in the past 6 months No   Has  the patient had a decrease in activity level because of a fear of falling?  No   Is the patient reluctant to leave their home because of a fear of falling?  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   Leisure She does not exercise     Cognition   Overall Cognitive Status Within Functional Limits for tasks assessed     Posture/Postural Control   Posture/Postural Control Postural limitations   Postural Limitations Rounded Shoulders;Forward head     ROM / Strength   AROM / PROM / Strength AROM;Strength     AROM   AROM Assessment Site Shoulder;Cervical   Right/Left Shoulder Left;Right   Right Shoulder Extension 51 Degrees   Right Shoulder Flexion 148 Degrees   Right Shoulder ABduction 156 Degrees   Right Shoulder Internal Rotation 67 Degrees   Right Shoulder External Rotation 76 Degrees   Left Shoulder Extension 60 Degrees   Left Shoulder Flexion 150 Degrees   Left  Shoulder ABduction 149 Degrees   Left Shoulder Internal Rotation 72 Degrees   Left Shoulder External Rotation 77 Degrees   Cervical Flexion WNL   Cervical Extension WNL   Cervical - Right Side Bend WNL   Cervical - Left Side Bend WNL   Cervical - Right Rotation WNL   Cervical - Left Rotation WNL     Strength   Overall Strength Within functional limits for tasks performed           LYMPHEDEMA/ONCOLOGY QUESTIONNAIRE - 04/03/16 1155      Type   Cancer Type Left breast cancer     Lymphedema Assessments   Lymphedema Assessments Upper extremities     Right Upper Extremity Lymphedema   10 cm Proximal to Olecranon Process 34.9 cm   Olecranon Process 27.1 cm   10 cm Proximal to Ulnar Styloid Process 26 cm   Just Proximal to Ulnar Styloid Process 18.1 cm    Across Hand at Thumb Web Space 19.2 cm   At Base of 2nd Digit 6.4 cm     Left Upper Extremity Lymphedema   10 cm Proximal to Olecranon Process 35.5 cm   Olecranon Process 27.6 cm   10 cm Proximal to Ulnar Styloid Process 25 cm   Just Proximal to Ulnar Styloid Process 17.9 cm   Across Hand at Thumb Web Space 19.4 cm   At Base of 2nd Digit 6.2 cm      Patient was instructed today in a home exercise program today for post op shoulder range of motion. These included active assist shoulder flexion in sitting, scapular retraction, wall walking with shoulder abduction, and hands behind head external rotation.  She was encouraged to do these twice a day, holding 3 seconds and repeating 5 times when permitted by her physician.         PT Education - 04/03/16 1200    Education provided Yes   Education Details Lymphedema risk reduction and post op shoulder ROM HEP   Person(s) Educated Patient;Caregiver(s)  Mother, daughter, and boyfriend   Methods Explanation;Demonstration;Handout   Comprehension Returned demonstration;Verbalized understanding              Breast Clinic Goals - 04/03/16 1208      Patient will be able to verbalize understanding of pertinent lymphedema risk reduction practices relevant to her diagnosis specifically related to skin care.   Time 1   Period Days   Status Achieved     Patient will be able to return demonstrate and/or verbalize understanding of the post-op home exercise program related to regaining shoulder range of motion.   Time 1   Period Days   Status Achieved     Patient will be able to verbalize understanding of the importance of attending the postoperative After Breast Cancer Class for further lymphedema risk reduction education and therapeutic exercise.   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  It measures 3.5 cm and is located in the lower inner quadrant. The Ki67 is 90% and she has a positive axillary lymph node. Her multidisciplinary medical team met prior to her assessments to determine a recommended treatment plan. She is planning to have neoadjuvant chemotherapy followed by either a left lumpectomy or mastectomy and either a targeted axillary node dissection or a complete   axillary node dissection. She will then undergo radiation to her left breast and axillary region. She will benefit from post op PT to regain shoulder ROM and reduce lymphedema risk as she will be at significant risk. Due to her lack of comorbidities, her eval is of low complexity.   Rehab Potential Excellent   Clinical Impairments Affecting Rehab Potential none   PT Frequency One time visit   PT Treatment/Interventions Patient/family education;Therapeutic exercise   PT Next Visit Plan Will f/u after surgery to determine PT needs   PT Home Exercise Plan Post op shoulder ROM HEP   Consulted and Agree with Plan of Care Patient;Family member/caregiver   Family Member Consulted Mother, daughter, boyfriend      Patient will benefit from skilled therapeutic intervention in order to improve the following deficits and impairments:  Postural dysfunction, Decreased knowledge of precautions, Impaired UE functional use, Decreased range of motion  Visit Diagnosis: Carcinoma of lower-inner quadrant of left breast in female, estrogen receptor negative (HCC) - Plan: PT plan of care cert/re-cert  Abnormal posture - Plan: PT plan of care cert/re-cert   Patient will follow up at outpatient cancer rehab if needed following surgery.  If the patient requires physical therapy at that time, a specific plan will be dictated and sent to the referring physician for approval. The patient was educated today on appropriate basic range of motion exercises to begin post operatively and the importance of attending the After Breast Cancer class  following surgery.  Patient was educated today on lymphedema risk reduction practices as it pertains to recommendations that will benefit the patient immediately following surgery.  She verbalized good understanding.  No additional physical therapy is indicated at this time.     Problem List Patient Active Problem List   Diagnosis Date Noted  . Malignant neoplasm of lower-inner quadrant of left breast in female, estrogen receptor negative (HCC) 03/26/2016  . Menorrhagia with regular cycle 03/05/2016  . Anemia 03/05/2016  . Left breast mass 03/05/2016  . Elevated liver enzymes 03/05/2016    Marti Cooper Smith, PT 04/03/16 12:10 PM  [facility] [address] [place], [place], [address] Phone: [phone]   Fax:  [phone]  Name: Susan Morrison MRN: [id number] Date of Birth: 04/06/1970  

## 2016-04-03 NOTE — Progress Notes (Signed)
START ON PATHWAY REGIMEN - Breast   Doxorubicin + Cyclophosphamide (AC):   A cycle is every 21 days:     Doxorubicin        Dose Mod: None     Cyclophosphamide        Dose Mod: None  **Always confirm dose/schedule in your pharmacy ordering system**    Paclitaxel 80 mg/m2 Weekly:   Administer weekly:     Paclitaxel        Dose Mod: None  **Always confirm dose/schedule in your pharmacy ordering system**    Patient Characteristics: Neoadjuvant Chemotherapy, HER2/neu Negative/Unknown/Equivocal, ER Negative, Platinum Therapy Not Indicated AJCC Stage Grouping: IIB Current Disease Status: No Distant Mets or Local Recurrence AJCC M Stage: 0 ER Status: Negative (-) AJCC N Stage: 1 AJCC T Stage: 2 HER2/neu: Negative (-) PR Status: Negative (-) Type of Therapy: Platinum Therapy Not Indicated  Intent of Therapy: Curative Intent, Discussed with Patient

## 2016-04-03 NOTE — Progress Notes (Signed)
[facility] NOTE  Patient Care Team: Flossie Buffy, NP as PCP - General (Internal Medicine) Alphonsa Overall, MD as Consulting Physician (General Surgery) Nicholas Lose, MD as Consulting Physician (Hematology and Oncology) Eppie Gibson, MD as Attending Physician (Radiation Oncology)  CHIEF COMPLAINTS/PURPOSE OF CONSULTATION:  Newly diagnosed breast cancer  HISTORY OF PRESENTING ILLNESS:  Susan Morrison 46 y.o. female is here because of recent diagnosis of left breast cancer. Patient felt a palpable lump in the left breast late last year. She did not think it was much better and was growing rapidly and so she brought to the attention of her physician. She then underwent a mammogram and ultrasound and a biopsy. 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 It was triple negative disease. She was presented this morning at the multidisciplinary tumor board and she is here today to discuss a treatment plan accompanied by her family. 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  I reviewed her records extensively and collaborated the history with the patient.  SUMMARY OF ONCOLOGIC HISTORY:   Malignant neoplasm of lower-inner quadrant of left breast in female, estrogen receptor negative ([place])   03/22/2016 Initial Diagnosis    Left breast biopsy 8:00: IDC high-grade with necrosis, left axillary lymph node biopsy IDC, ER 0%, PR 0%, HER-2 negative ratio 1.42, Ki-67 90%; palpable lump 3.5 x 2.6 x 2.2 cm and 2.8 Center left axillary lymph node, T2 N1 stage IIB       MEDICAL HISTORY:  Past Medical History:  Diagnosis Date  . Breast mass 03/21/2016   left 7:00    SURGICAL HISTORY: Past Surgical History:  Procedure Laterality Date  . DILATION AND CURETTAGE OF UTERUS     last done 2years ago  . TUBAL LIGATION  1996    SOCIAL HISTORY: Social History   Social History  .  Marital status: Married    Spouse name: N/A  . Number of children: N/A  . Years of education: N/A   Occupational History  . Not on file.   Social History Main Topics  . Smoking status: Never Smoker  . Smokeless tobacco: Never Used  . Alcohol use Yes     Comment: wine/social  . Drug use: No  . Sexual activity: Yes    Birth control/ protection: Surgical   Other Topics Concern  . Not on file   Social History Narrative  . No narrative on file    FAMILY HISTORY: Family History  Problem Relation Age of Onset  . Hypertension Mother   . Diabetes Mother   . Cancer Paternal Grandmother 30    breast cancer    ALLERGIES:  has No Known Allergies.  MEDICATIONS:  Current Outpatient Prescriptions  Medication Sig Dispense Refill  . Multiple Vitamins-Minerals (WOMENS MULTI VITAMIN & MINERAL) TABS Take 1 tablet by mouth daily after breakfast. 30 tablet 3  . dexamethasone (DECADRON) 4 MG tablet Take 1 tablet (4 mg total) by mouth daily. Take 1 tablets by mouth once a day on the day after chemotherapy for 2 days. Take with food. 30 tablet 1  . lidocaine-prilocaine (EMLA) cream Apply to affected area once 30 g 3  . LORazepam (ATIVAN) 0.5 MG tablet Take 1 tablet (0.5 mg total) by mouth every 6 (six) hours as needed (Nausea or vomiting). 30 tablet 0  . ondansetron (ZOFRAN) 8 MG tablet Take 1 tablet (8 mg total) by mouth 2 (two)  times daily as needed. Start on the third day after chemotherapy. 30 tablet 1  . prochlorperazine (COMPAZINE) 10 MG tablet Take 1 tablet (10 mg total) by mouth every 6 (six) hours as needed (Nausea or vomiting). 30 tablet 1   No current facility-administered medications for this visit.     REVIEW OF SYSTEMS:   Constitutional: Denies fevers, chills or abnormal night sweats Eyes: Denies blurriness of vision, double vision or watery eyes Ears, nose, mouth, throat, and face: Denies mucositis or sore throat Respiratory: Denies cough, dyspnea or wheezes Cardiovascular:  Denies palpitation, chest discomfort or lower extremity swelling Gastrointestinal:  Denies nausea, heartburn or change in bowel habits Skin: Denies abnormal skin rashes Lymphatics: Denies new lymphadenopathy or easy bruising Neurological:Denies numbness, tingling or new weaknesses Behavioral/Psych: Mood is stable, no new changes  Breast: Palpable lump in the left breast All other systems were reviewed with the patient and are negative.  PHYSICAL EXAMINATION: ECOG PERFORMANCE STATUS: 1 - Symptomatic but completely ambulatory  Vitals:   04/03/16 0844  BP: 138/75  Pulse: 84  Resp: 18  Temp: 98.2 F (36.8 C)   Filed Weights   04/03/16 0844  Weight: 206 lb 6.4 oz (93.6 kg)    GENERAL:alert, no distress and comfortable SKIN: skin color, texture, turgor are normal, no rashes or significant lesions EYES: normal, conjunctiva are pink and non-injected, sclera clear OROPHARYNX:no exudate, no erythema and lips, buccal mucosa, and tongue normal  NECK: supple, thyroid normal size, non-tender, without nodularity LYMPH:  no palpable lymphadenopathy in the cervical, axillary or inguinal LUNGS: clear to auscultation and percussion with normal breathing effort HEART: regular rate & rhythm and no murmurs and no lower extremity edema ABDOMEN:abdomen soft, non-tender and normal bowel sounds Musculoskeletal:no cyanosis of digits and no clubbing  PSYCH: alert & oriented x 3 with fluent speech NEURO: no focal motor/sensory deficits BREAST:Left breast lump is palpable. No palpable axillary or supraclavicular lymphadenopathy (exam performed in the presence of a chaperone)   LABORATORY DATA:  I have reviewed the data as listed Lab Results  Component Value Date   WBC 5.8 04/03/2016   HGB 7.3 (L) 04/03/2016   HCT 27.0 (L) 04/03/2016   MCV 55.0 (L) 04/03/2016   PLT 431 (H) 04/03/2016   Lab Results  Component Value Date   NA 140 04/03/2016   K 4.1 04/03/2016   CL 109 03/05/2016   CO2 24  04/03/2016    RADIOGRAPHIC STUDIES: I have personally reviewed the radiological reports and agreed with the findings in the report.  ASSESSMENT AND PLAN:  Malignant neoplasm of lower-inner quadrant of left breast in female, estrogen receptor negative ([place]) 03/22/2016: Left breast biopsy 8:00: IDC high-grade with necrosis, left axillary lymph node biopsy IDC, ER 0%, PR 0%, HER-2 negative ratio 1.42, Ki-67 90%; palpable lump 3.5 x 2.6 x 2.2 cm and 2.8 Center left axillary lymph node, T2 N1 stage IIB  Pathology and radiology counseling: Discussed with the patient, the details of pathology including the type of breast cancer,the clinical staging, the significance of ER, PR and HER-2/neu receptors and the implications for treatment. After reviewing the pathology in detail, we proceeded to discuss the different treatment options between surgery, radiation, chemotherapy, antiestrogen therapies.  Recommendation based on multidisciplinary tumor board: 1. Neoadjuvant chemotherapy with Adriamycin and Cytoxan dose dense 4 followed by Taxol weekly 12 2. Followed by breast conserving surgery with sentinel lymph node study vs targeted axillary dissection 3. Followed by adjuvant radiation therapy  Chemotherapy Counseling: I discussed the  risks and benefits of chemotherapy including the risks of nausea/ vomiting, risk of infection from low WBC count, fatigue due to chemo or anemia, bruising or bleeding due to low platelets, mouth sores, loss/ change in taste and decreased appetite. Liver and kidney function will be monitored through out chemotherapy as abnormalities in liver and kidney function may be a side effect of treatment. Cardiac dysfunction due to Adriamycin was discussed in detail. Risk of permanent bone marrow dysfunction due to chemo were also discussed.  Plan: 1. Port placement 2. Echocardiogram 3. Chemotherapy class 4. Breast MRI 5. Genetic counseling will also be arranged  PREVENT trial:  [id number] [phone]  Newly diagnosed breast cancer Stages 1-3 scheduled to receive anthracycline randomized to atorvastatin 40 mg or placebo once daily for 24 months along with 3 cardiac MRIs or 2 years paid for by the trial. I discussed the risks and benefits of atorvastatin including the risk of myopathy and elevation of liver function tests. I explained the randomization process and patient is interested in the trial. I provided her with literature to read and provided her with the contact with the clinical trials nurse to ask any questions regarding the trial.  Return to clinic in 2 weeks to start chemotherapy.   All questions were answered. The patient knows to call the clinic with any problems, questions or concerns.    Rulon Eisenmenger, MD 04/03/16

## 2016-04-03 NOTE — Progress Notes (Signed)
Clinical Social Work [place] Psychosocial Distress Screening [place]  Patient completed distress screening protocol and scored a 2 on the Psychosocial Distress Thermometer which indicates mild distress. Clinical Social Worker met with patient and patients family in [facility] to assess for distress and other psychosocial needs. Patient stated she was feeling overwhelmed but felt "better" after meeting with the treatment team and getting more information on her treatment plan. CSW and patient discussed common feeling and emotions when being diagnosed with cancer, and the importance of support during treatment. CSW informed patient of the support team and support services at [facility], and patient was agreeable to an [employer] referral. CSW provided contact information and encouraged patient to call with any questions or concerns.  ONCBCN DISTRESS SCREENING 04/03/2016  Screening Type Initial Screening  Distress experienced in past week (1-10) 2  Information Concerns Type Lack of info about diagnosis     Johnnye Lana, MSW, LCSW, OSW-C Clinical Social Worker [place] [phone]

## 2016-04-03 NOTE — Telephone Encounter (Signed)
I called the patient to inform her that she is very severely iron deficient with a hemoglobin of 7.6 and MCV of 55. She will need intravenous iron therapy. We will administer IV iron treatment along with chemotherapy on cycle one cycle 2. Elevated LFTs: I would like to obtain a CT chest abdomen pelvis with contrast for further assessment.

## 2016-04-03 NOTE — Assessment & Plan Note (Signed)
03/22/2016: Left breast biopsy 8:00: IDC high-grade with necrosis, left axillary lymph node biopsy IDC, ER 0%, PR 0%, HER-2 negative ratio 1.42, Ki-67 90%; palpable lump 3.5 x 2.6 x 2.2 cm and 2.8 Center left axillary lymph node, T2 N1 stage IIB  Pathology and radiology counseling: Discussed with the patient, the details of pathology including the type of breast cancer,the clinical staging, the significance of ER, PR and HER-2/neu receptors and the implications for treatment. After reviewing the pathology in detail, we proceeded to discuss the different treatment options between surgery, radiation, chemotherapy, antiestrogen therapies.  Recommendation based on multidisciplinary tumor board: 1. Neoadjuvant chemotherapy with Adriamycin and Cytoxan dose dense 4 followed by Taxol weekly 12 2. Followed by breast conserving surgery with sentinel lymph node study vs targeted axillary dissection 3. Followed by adjuvant radiation therapy  Chemotherapy Counseling: I discussed the risks and benefits of chemotherapy including the risks of nausea/ vomiting, risk of infection from low WBC count, fatigue due to chemo or anemia, bruising or bleeding due to low platelets, mouth sores, loss/ change in taste and decreased appetite. Liver and kidney function will be monitored through out chemotherapy as abnormalities in liver and kidney function may be a side effect of treatment. Cardiac dysfunction due to Adriamycin was discussed in detail. Risk of permanent bone marrow dysfunction due to chemo were also discussed.  Plan: 1. Port placement 2. Echocardiogram 3. Chemotherapy class 4. Breast MRI 5. Genetic counseling will also be arranged  PREVENT trial: [id number] [phone]  Newly diagnosed breast cancer Stages 1-3 scheduled to receive anthracycline randomized to atorvastatin 40 mg or placebo once daily for 24 months along with 3 cardiac MRIs or 2 years paid for by the trial. I discussed the risks and benefits of  atorvastatin including the risk of myopathy and elevation of liver function tests. I explained the randomization process and patient is interested in the trial. I provided her with literature to read and provided her with the contact with the clinical trials nurse to ask any questions regarding the trial.  Return to clinic in 2 weeks to start chemotherapy.

## 2016-04-03 NOTE — Progress Notes (Signed)
Nutrition Assessment  Reason for Assessment:  Pt seen in Breast Clinic  ASSESSMENT:   46 year old female with left breast cancer.  Past medical history reviewed.  Patient reports normal appetite.  Medications:  MVI  Labs: reviewed  Anthropometrics:   Height: 65.5 inches Weight: 206 lb BMI: 32.9   NUTRITION DIAGNOSIS: Food and nutrition related knowledge deficit related to new diagnosis of breast cancer as evidenced by no prior need for nutrition related information.  INTERVENTION:   Discussed and provided packet of information regarding nutritional tips for breast cancer patients.  Questions answered.  Teachback method used.  Contact information provided and patient knows to contact me with questions/concerns.    MONITORING, EVALUATION, and GOAL: Pt will consume a healthy plant based diet to maintain lean body mass throughout treatment.   Amy Belloso B. Zenia Resides, [place], [place] Registered Dietitian [phone] (pager)

## 2016-04-04 ENCOUNTER — Telehealth: Payer: Self-pay | Admitting: Hematology and Oncology

## 2016-04-04 NOTE — Telephone Encounter (Signed)
sw pt to confirm infusion appt 3/14 at 0830 per LOS

## 2016-04-05 ENCOUNTER — Telehealth: Payer: Self-pay | Admitting: *Deleted

## 2016-04-05 ENCOUNTER — Encounter: Payer: Self-pay | Admitting: *Deleted

## 2016-04-05 ENCOUNTER — Ambulatory Visit (HOSPITAL_COMMUNITY): Admission: RE | Admit: 2016-04-05 | Payer: Self-pay | Source: Ambulatory Visit

## 2016-04-05 ENCOUNTER — Ambulatory Visit (HOSPITAL_COMMUNITY)
Admission: RE | Admit: 2016-04-05 | Discharge: 2016-04-05 | Disposition: A | Discharge status: Home / Self Care | Payer: Medicaid Other | Source: Ambulatory Visit | Attending: Hematology and Oncology | Admitting: Hematology and Oncology

## 2016-04-05 DIAGNOSIS — C50312 Malignant neoplasm of lower-inner quadrant of left female breast: Secondary | ICD-10-CM | POA: Diagnosis not present

## 2016-04-05 DIAGNOSIS — Z171 Estrogen receptor negative status [ER-]: Secondary | ICD-10-CM | POA: Insufficient documentation

## 2016-04-05 MED ORDER — GADOBENATE DIMEGLUMINE 529 MG/ML IV SOLN
20.0000 mL | Freq: Once | INTRAVENOUS | Status: AC | PRN
  Administered 2016-04-05: 19 mL via INTRAVENOUS

## 2016-04-05 NOTE — Telephone Encounter (Signed)
Spoke with patient and confirmed appointment for echo and chemo class for 3/6 at 11am and 12pm.

## 2016-04-08 ENCOUNTER — Other Ambulatory Visit: Payer: Self-pay | Admitting: *Deleted

## 2016-04-08 ENCOUNTER — Telehealth: Payer: Self-pay | Admitting: *Deleted

## 2016-04-08 DIAGNOSIS — C50312 Malignant neoplasm of lower-inner quadrant of left female breast: Secondary | ICD-10-CM

## 2016-04-08 DIAGNOSIS — Z171 Estrogen receptor negative status [ER-]: Principal | ICD-10-CM

## 2016-04-08 NOTE — Telephone Encounter (Signed)
Spoke with patient concerning her MRI.  She needs additional biopsies and per Dr. Lindi Adie I have scheduled her for these for 04/10/16 at 715am. She is aware.

## 2016-04-09 ENCOUNTER — Ambulatory Visit (HOSPITAL_COMMUNITY)
Admission: RE | Admit: 2016-04-09 | Discharge: 2016-04-09 | Disposition: A | Discharge status: Home / Self Care | Payer: Medicaid Other | Source: Ambulatory Visit | Attending: Hematology and Oncology | Admitting: Hematology and Oncology

## 2016-04-09 ENCOUNTER — Other Ambulatory Visit: Payer: Self-pay | Admitting: Hematology and Oncology

## 2016-04-09 ENCOUNTER — Encounter: Payer: Self-pay | Admitting: *Deleted

## 2016-04-09 ENCOUNTER — Other Ambulatory Visit: Payer: Self-pay | Admitting: Emergency Medicine

## 2016-04-09 ENCOUNTER — Other Ambulatory Visit: Payer: Self-pay

## 2016-04-09 DIAGNOSIS — C50312 Malignant neoplasm of lower-inner quadrant of left female breast: Secondary | ICD-10-CM | POA: Diagnosis present

## 2016-04-09 DIAGNOSIS — Z171 Estrogen receptor negative status [ER-]: Principal | ICD-10-CM

## 2016-04-09 MED ORDER — LORAZEPAM 0.5 MG PO TABS: 0.5000 mg | ORAL_TABLET | Freq: Four times a day (QID) | ORAL | 0 refills | Status: DC | PRN

## 2016-04-09 NOTE — Progress Notes (Signed)
  Echocardiogram 2D Echocardiogram has been performed.  Susan Morrison 04/09/2016, 11:46 AM

## 2016-04-10 ENCOUNTER — Other Ambulatory Visit: Payer: Self-pay | Admitting: Hematology and Oncology

## 2016-04-10 ENCOUNTER — Ambulatory Visit
Admission: RE | Admit: 2016-04-10 | Discharge: 2016-04-10 | Disposition: A | Discharge status: Home / Self Care | Payer: No Typology Code available for payment source | Source: Ambulatory Visit | Attending: Hematology and Oncology | Admitting: Hematology and Oncology

## 2016-04-10 ENCOUNTER — Encounter: Payer: Self-pay | Admitting: Hematology and Oncology

## 2016-04-10 ENCOUNTER — Encounter (HOSPITAL_COMMUNITY): Payer: Self-pay | Admitting: *Deleted

## 2016-04-10 ENCOUNTER — Ambulatory Visit: Admission: RE | Admit: 2016-04-10 | Payer: No Typology Code available for payment source | Source: Ambulatory Visit

## 2016-04-10 DIAGNOSIS — C50312 Malignant neoplasm of lower-inner quadrant of left female breast: Secondary | ICD-10-CM

## 2016-04-10 DIAGNOSIS — Z171 Estrogen receptor negative status [ER-]: Principal | ICD-10-CM

## 2016-04-10 NOTE — Progress Notes (Signed)
Met w/ pt regarding drug replacement but was told she has BCCCP and applied for Medicaid through [place].  Per Rebecka Apley her Medicaid will retro back to 03/07/16 so I will not be completing applications for drug replacement at this time.  I also informed pt of the [employer], went over what it covers and gave her an expense sheet.  She's not working so she will provide me with a letter of support from her mother.  Once received I will activate her account.

## 2016-04-11 ENCOUNTER — Other Ambulatory Visit: Payer: No Typology Code available for payment source

## 2016-04-12 ENCOUNTER — Ambulatory Visit (HOSPITAL_COMMUNITY)
Admission: RE | Admit: 2016-04-12 | Discharge: 2016-04-12 | Disposition: A | Discharge status: Home / Self Care | Payer: Medicaid Other | Source: Ambulatory Visit | Attending: Hematology and Oncology | Admitting: Hematology and Oncology

## 2016-04-12 DIAGNOSIS — K8020 Calculus of gallbladder without cholecystitis without obstruction: Secondary | ICD-10-CM | POA: Diagnosis not present

## 2016-04-12 DIAGNOSIS — C50312 Malignant neoplasm of lower-inner quadrant of left female breast: Secondary | ICD-10-CM | POA: Diagnosis not present

## 2016-04-12 DIAGNOSIS — D259 Leiomyoma of uterus, unspecified: Secondary | ICD-10-CM | POA: Insufficient documentation

## 2016-04-12 DIAGNOSIS — D1809 Hemangioma of other sites: Secondary | ICD-10-CM | POA: Insufficient documentation

## 2016-04-12 DIAGNOSIS — Z171 Estrogen receptor negative status [ER-]: Secondary | ICD-10-CM | POA: Diagnosis not present

## 2016-04-12 MED ORDER — IOPAMIDOL (ISOVUE-300) INJECTION 61%
INTRAVENOUS | Status: AC
  Administered 2016-04-12: 30 mL
  Filled 2016-04-12: qty 30

## 2016-04-12 MED ORDER — IOPAMIDOL (ISOVUE-300) INJECTION 61%: 15.0000 mL | Freq: Two times a day (BID) | INTRAVENOUS | Status: DC | PRN

## 2016-04-12 MED ORDER — IOPAMIDOL (ISOVUE-300) INJECTION 61%
100.0000 mL | Freq: Once | INTRAVENOUS | Status: AC | PRN
  Administered 2016-04-12: 100 mL via INTRAVENOUS

## 2016-04-12 MED ORDER — IOPAMIDOL (ISOVUE-300) INJECTION 61%
INTRAVENOUS | Status: AC
  Filled 2016-04-12: qty 100

## 2016-04-12 NOTE — Progress Notes (Signed)
CBC and CMP done 04/03/16 faxed via epic to Dr Alphonsa Overall.

## 2016-04-14 NOTE — H&P (Signed)
Susan Morrison  Location: [facility] Patient #: [id number] DOB: May 31, 1970 Undefined / Language: Cleophus Molt / Race: Black or African American Female  History of Present Illness   The patient is a 46 year old female who presents with a complaint of Left breast cancer.   The PCP is Susan Lacy, NP  She is at the Breast [facility] - Drs. Susan Morrison and Susan Morrison  Significant other - Susan Morrison, aunt - Susan Morrison (she used to work with Dr. Sondra Morrison), Susan Morrison, daughter Susan Morrison, in room.  She felt a small knot in her left breast. This prompted her first mammogram. A grandmother on ther father's side had breast cancer. Her LMP was 2/22.  She underwent mammograms at [facility] on 02/19/2016 which showed in the 7:30 o'clock location a 2.5 x 2.6 cm mass. She also had enlarged lymph node. 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 She is at the Breast multidisciplinary clinic, which includes medical oncology and radiation oncology. I discussed the surgical options of lumpectomy vs. mastectomy. If mastectomy, there is the possibility of reconstruction. I discussed the options of lymph node biopsy. The treatment plan depends on the pathologic staging of the tumor and the patient's personal wishes. The risks of surgery include, but are not limited to, bleeding, infection, the need for further surgery, and nerve injury. The patient has been given literature on the treatment of breast cancer.  Plan: 1) MRI, 2) power port placement, 3) Genetics, 4) Neoadjuvant chemothx, 5) She is a candidate for left breast lumpectomy, but we can see how she does.  I discussed the indications and potential complications of the power port placement. The primary complications of the power port, include, but  are not limited to, bleeding, infection, nerve injury, thrombosis, and pneumothorax.  Past Medical History: 1. Otherwise healthy  Social History: Has significant other. ?married? Significant other - Susan Morrison, aunt - Susan Morrison (she used to work with Dr. Sondra Morrison), [place], daughter Susan Morrison, in room. She has 3 children: [place] - 28 yo, Shanece - 66, Bryson - 21. 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 Tonsillectomy   Diagnostic Studies History Susan Pummel, RN; 04/03/2016 7:42 AM) Colonoscopy  never Pap Smear  1-5 years ago  Medication History Susan Pummel, RN; 04/03/2016 7:42 AM) Medications Reconciled  Social History Susan Pummel, RN; 04/03/2016 7:42 AM) Alcohol use  Occasional alcohol use. Caffeine use  Carbonated beverages, Coffee, Tea. No drug use   Family History Susan Pummel, RN; 04/03/2016 7:42 AM) Arthritis  Family Members In General. Breast Cancer  Family Members In General. Depression  Family Members In General, Mother, Sister. Diabetes Mellitus  Family Members In General, Mother. Hypertension  Family Members In General, Mother. Thyroid problems  Family Members In General.  Pregnancy / Birth History Susan Pummel, RN; 04/03/2016 7:42 AM) Age at menarche  32 years. Contraceptive History  Oral contraceptives. Gravida  3 Maternal age  69-20 Para  3 Regular periods   Other Problems Susan Pummel, RN; 04/03/2016 7:42 AM) Arthritis  Breast Cancer     Review of Systems Susan Pummel RN; 04/03/2016 7:42 AM) General Present- Appetite Loss, Night Sweats and Weight Loss. Not Present- Chills, Fatigue,  Fever and Weight Gain. Skin Not Present- Change in Wart/Mole, Dryness, Hives, Jaundice, New Lesions, Non-Healing Wounds, Rash and Ulcer. HEENT Not Present- Earache, Hearing Loss,  Hoarseness, Nose Bleed, Oral Ulcers, Ringing in the Ears, Seasonal Allergies, Sinus Pain, Sore Throat, Visual Disturbances, Wears glasses/contact lenses and Yellow Eyes. Respiratory Present- Snoring. Not Present- Bloody sputum, Chronic Cough, Difficulty Breathing and Wheezing. Breast Present- Breast Mass. Not Present- Breast Pain, Nipple Discharge and Skin Changes. Cardiovascular Present- Leg Cramps and Rapid Heart Rate. Not Present- Chest Pain, Difficulty Breathing Lying Down, Palpitations, Shortness of Breath and Swelling of Extremities. Female Genitourinary Present- Frequency and Nocturia. Not Present- Painful Urination, Pelvic Pain and Urgency. Neurological Not Present- Decreased Memory, Fainting, Headaches, Numbness, Seizures, Tingling, Tremor, Trouble walking and Weakness. Psychiatric Not Present- Anxiety, Bipolar, Change in Sleep Pattern, Depression, Fearful and Frequent crying. Endocrine Not Present- Cold Intolerance, Excessive Hunger, Hair Changes, Heat Intolerance, Hot flashes and New Diabetes. Hematology Present- Easy Bruising and Excessive bleeding. Not Present- Blood Thinners, Gland problems, HIV and Persistent Infections.   Physical Exam  General: WN AAalert and generally healthy appearing. Skin: Inspection and palpation of the skin unremarkable.  Eyes: Conjunctivae white, pupils equal. Face, ears, nose, mouth, and throat: Face - normal. Normal ears and nose. Lips and teeth normal.  Neck: Supple. No mass. Trachea midline. No thyroid mass.  Lymph Nodes: No supraclavicular or cervical adenopathy. Axilla - she has a 2+ cm node in her left axilla. The palpable node is hgh.  Lungs: Normal respiratory effort. Clear to auscultation and symmetric breath sounds. Cardiovascular: Regular rate and rythm. Normal auscultation of the heart. No murmur or rub. Normal carotid pulse.  Breasts: right - No mass or nodule Left - She has a distinct 3+ cm mass at the 7  o'clock position of the left breast.  Abdomen: Soft. No mass. Liver and spleen not palpable. No tenderness. No hernia. Normal bowel sounds. No abdominal scars. Rectal: Not done.  Musculoskeletal/extremities: Normal gait. Good strength and ROM in upper and lower extremities.  Digits and nails are unremarkable.  Neurologic: Grossly intact to motor and sensory function.   Psychiatric: Has normal mood and affect. Judgement and insight appear normal.   Assessment & Plan  1.  BREAST CANCER, STAGE 2, LEFT (C50.912)  Story: She underwent mammograms at [facility] on 02/19/2016 which showed in the 7:30 o'clock location a 2.5 x 2.6 cm mass. She also had enlarged lymph node.  Biopsy of the left breast and left axillay lymph node ([id number]) on 03/22/2016 showed IDC, High grade, triple negative, Ki67 - 90%   Treating oncologist - Gudena/Susan Morrison   Plan:   1) MRI (see below),   2) power port placement,   3) Genetics,   4) Neoadjuvant chemothx  2.  Other breast lesions  MRI of breast - 04/05/2016 - IMPRESSION:  1. The known left breast carcinoma lies in the inferior posterior left breast measuring 3.9 x 2.7 x 3.1 cm.  2. There are 3 adjacent small masses which shows similar enhancement characteristics to the index lesion. The largest of these measures 6 x 5 mm.  3. Also in the left breast, there is a focal area of non mass enhancement measuring 8 x 7 mm, which lies in the medial aspect of the breast, central depth.  4. In the right breast, there are 3 discrete areas of focal non mass enhancement, which show similar enhancement characteristics with moderate wash-in and plateau washout kinetics. Lesions are as follows: 6 mm lesion, image  144, 5 mm lesion, image 84 and 5 mm lesion, image 75.  5. Biopsy-proven enlarged abnormal left axillary metastatic lymph odes. Additional prominent axillary lymph nodes are also noted.  6. Abnormal  appearing right axillary lymph node with a cortical hickness of 8 mm.  RECOMMENDATION:  1. Targeted ultrasound of the right breast focused on the focal reas of non mass enhancement in the upper inner quadrant, posterior depth, and lateral breast as detailed above. Ultrasound of the right xilla also recommended to assess the abnormal appearing lymph node.  2. Consider targeted ultrasound of the focal area of enhancement in he medial left breast if this would alter management.  Additional biopsies - 04/10/2016 - ([id number]) - right 1:00 o'clock - complex sclerosing lesion, right 8:00 - complex sclerosing lesion, left 9:30 - sclerosed intraductal papilloma  3.  Chronic anemia  Hgb - 7.3 - 04/03/2016  She is on oral Fe    Alphonsa Overall, MD, [facility] Pager: [phone] Office phone:  [phone]

## 2016-04-15 ENCOUNTER — Ambulatory Visit (HOSPITAL_COMMUNITY): Payer: Medicaid Other | Admitting: Anesthesiology

## 2016-04-15 ENCOUNTER — Encounter (HOSPITAL_COMMUNITY): Payer: Self-pay

## 2016-04-15 ENCOUNTER — Ambulatory Visit (HOSPITAL_COMMUNITY): Payer: Medicaid Other

## 2016-04-15 ENCOUNTER — Encounter (HOSPITAL_COMMUNITY)
Admission: RE | Disposition: A | Discharge status: Home / Self Care | Payer: Self-pay | Source: Ambulatory Visit | Attending: Surgery

## 2016-04-15 ENCOUNTER — Ambulatory Visit (HOSPITAL_COMMUNITY)
Admission: RE | Admit: 2016-04-15 | Discharge: 2016-04-15 | Disposition: A | Discharge status: Home / Self Care | Payer: Medicaid Other | Source: Ambulatory Visit | Attending: Surgery | Admitting: Surgery

## 2016-04-15 DIAGNOSIS — E669 Obesity, unspecified: Secondary | ICD-10-CM | POA: Insufficient documentation

## 2016-04-15 DIAGNOSIS — Z803 Family history of malignant neoplasm of breast: Secondary | ICD-10-CM | POA: Insufficient documentation

## 2016-04-15 DIAGNOSIS — C50912 Malignant neoplasm of unspecified site of left female breast: Secondary | ICD-10-CM | POA: Insufficient documentation

## 2016-04-15 DIAGNOSIS — C50312 Malignant neoplasm of lower-inner quadrant of left female breast: Secondary | ICD-10-CM

## 2016-04-15 DIAGNOSIS — M1990 Unspecified osteoarthritis, unspecified site: Secondary | ICD-10-CM | POA: Diagnosis not present

## 2016-04-15 DIAGNOSIS — D649 Anemia, unspecified: Secondary | ICD-10-CM | POA: Diagnosis not present

## 2016-04-15 DIAGNOSIS — Z6832 Body mass index (BMI) 32.0-32.9, adult: Secondary | ICD-10-CM | POA: Diagnosis not present

## 2016-04-15 DIAGNOSIS — K219 Gastro-esophageal reflux disease without esophagitis: Secondary | ICD-10-CM | POA: Insufficient documentation

## 2016-04-15 DIAGNOSIS — N6489 Other specified disorders of breast: Secondary | ICD-10-CM | POA: Insufficient documentation

## 2016-04-15 DIAGNOSIS — Z171 Estrogen receptor negative status [ER-]: Secondary | ICD-10-CM

## 2016-04-15 HISTORY — DX: Anemia, unspecified: D64.9

## 2016-04-15 HISTORY — DX: Malignant (primary) neoplasm, unspecified: C80.1

## 2016-04-15 HISTORY — PX: PORTACATH PLACEMENT: SHX2246

## 2016-04-15 LAB — CBC
HCT: 28.8 % — ABNORMAL LOW (ref 36.0–46.0)
Hemoglobin: 8.2 g/dL — ABNORMAL LOW (ref 12.0–15.0)
MCH: 16.7 pg — ABNORMAL LOW (ref 26.0–34.0)
MCHC: 28.5 g/dL — ABNORMAL LOW (ref 30.0–36.0)
MCV: 58.5 fL — ABNORMAL LOW (ref 78.0–100.0)
Platelets: 257 10*3/uL (ref 150–400)
RBC: 4.92 MIL/uL (ref 3.87–5.11)
RDW: 26.4 % — ABNORMAL HIGH (ref 11.5–15.5)
WBC: 6.4 10*3/uL (ref 4.0–10.5)

## 2016-04-15 LAB — PREGNANCY, URINE: Preg Test, Ur: NEGATIVE

## 2016-04-15 SURGERY — INSERTION, TUNNELED CENTRAL VENOUS DEVICE, WITH PORT
Anesthesia: General

## 2016-04-15 MED ORDER — LIDOCAINE-EPINEPHRINE (PF) 1 %-1:200000 IJ SOLN
INTRAMUSCULAR | Status: AC
  Filled 2016-04-15: qty 30

## 2016-04-15 MED ORDER — ONDANSETRON HCL 4 MG/2ML IJ SOLN
INTRAMUSCULAR | Status: AC
  Filled 2016-04-15: qty 2

## 2016-04-15 MED ORDER — MIDAZOLAM HCL 5 MG/5ML IJ SOLN
INTRAMUSCULAR | Status: DC | PRN
  Administered 2016-04-15: 2 mg via INTRAVENOUS

## 2016-04-15 MED ORDER — DEXAMETHASONE SODIUM PHOSPHATE 10 MG/ML IJ SOLN
INTRAMUSCULAR | Status: DC | PRN
  Administered 2016-04-15: 10 mg via INTRAVENOUS

## 2016-04-15 MED ORDER — PROMETHAZINE HCL 25 MG/ML IJ SOLN
6.2500 mg | INTRAMUSCULAR | Status: DC | PRN
  Administered 2016-04-15: 6.25 mg via INTRAVENOUS

## 2016-04-15 MED ORDER — SODIUM CHLORIDE 0.9 % IV SOLN
Freq: Once | INTRAVENOUS | Status: AC
  Administered 2016-04-15: 10:00:00 500 mL
  Filled 2016-04-15: qty 1.2

## 2016-04-15 MED ORDER — SCOPOLAMINE 1 MG/3DAYS TD PT72
MEDICATED_PATCH | TRANSDERMAL | Status: AC
  Filled 2016-04-15: qty 1

## 2016-04-15 MED ORDER — PROMETHAZINE HCL 25 MG/ML IJ SOLN
INTRAMUSCULAR | Status: AC
  Filled 2016-04-15: qty 1

## 2016-04-15 MED ORDER — LACTATED RINGERS IV SOLN
INTRAVENOUS | Status: DC
Start: 2016-04-15 — End: 2016-04-15
  Administered 2016-04-15: 08:00:00 via INTRAVENOUS
  Administered 2016-04-15: 1000 mL via INTRAVENOUS

## 2016-04-15 MED ORDER — GABAPENTIN 300 MG PO CAPS
300.0000 mg | ORAL_CAPSULE | ORAL | Status: AC
  Administered 2016-04-15: 300 mg via ORAL
  Filled 2016-04-15: qty 1

## 2016-04-15 MED ORDER — MIDAZOLAM HCL 2 MG/2ML IJ SOLN
INTRAMUSCULAR | Status: AC
  Filled 2016-04-15: qty 2

## 2016-04-15 MED ORDER — FENTANYL CITRATE (PF) 100 MCG/2ML IJ SOLN
INTRAMUSCULAR | Status: AC
  Filled 2016-04-15: qty 2

## 2016-04-15 MED ORDER — ONDANSETRON HCL 4 MG/2ML IJ SOLN
INTRAMUSCULAR | Status: DC | PRN
  Administered 2016-04-15: 4 mg via INTRAVENOUS

## 2016-04-15 MED ORDER — ACETAMINOPHEN 500 MG PO TABS
1000.0000 mg | ORAL_TABLET | ORAL | Status: AC
  Administered 2016-04-15: 1000 mg via ORAL
  Filled 2016-04-15: qty 2

## 2016-04-15 MED ORDER — FENTANYL CITRATE (PF) 100 MCG/2ML IJ SOLN: 25.0000 ug | INTRAMUSCULAR | Status: DC | PRN

## 2016-04-15 MED ORDER — PROPOFOL 10 MG/ML IV BOLUS
INTRAVENOUS | Status: AC
  Filled 2016-04-15: qty 20

## 2016-04-15 MED ORDER — HEPARIN SOD (PORK) LOCK FLUSH 100 UNIT/ML IV SOLN
INTRAVENOUS | Status: AC
  Filled 2016-04-15: qty 5

## 2016-04-15 MED ORDER — 0.9 % SODIUM CHLORIDE (POUR BTL) OPTIME
TOPICAL | Status: DC | PRN
  Administered 2016-04-15: 1000 mL

## 2016-04-15 MED ORDER — HYDROCODONE-ACETAMINOPHEN 5-325 MG PO TABS: 1.0000 | ORAL_TABLET | Freq: Four times a day (QID) | ORAL | Status: DC | PRN

## 2016-04-15 MED ORDER — LIDOCAINE 2% (20 MG/ML) 5 ML SYRINGE
INTRAMUSCULAR | Status: AC
  Filled 2016-04-15: qty 5

## 2016-04-15 MED ORDER — CHLORHEXIDINE GLUCONATE CLOTH 2 % EX PADS: 6.0000 | MEDICATED_PAD | Freq: Once | CUTANEOUS | Status: DC

## 2016-04-15 MED ORDER — DEXAMETHASONE SODIUM PHOSPHATE 10 MG/ML IJ SOLN
INTRAMUSCULAR | Status: AC
  Filled 2016-04-15: qty 1

## 2016-04-15 MED ORDER — LIDOCAINE 2% (20 MG/ML) 5 ML SYRINGE
INTRAMUSCULAR | Status: DC | PRN
  Administered 2016-04-15: 100 mg via INTRAVENOUS

## 2016-04-15 MED ORDER — CEFAZOLIN SODIUM-DEXTROSE 2-4 GM/100ML-% IV SOLN
2.0000 g | INTRAVENOUS | Status: AC
  Administered 2016-04-15: 2 g via INTRAVENOUS
  Filled 2016-04-15: qty 100

## 2016-04-15 MED ORDER — LIDOCAINE HCL (PF) 1 % IJ SOLN
INTRAMUSCULAR | Status: DC | PRN
  Administered 2016-04-15: 15 mL

## 2016-04-15 MED ORDER — FENTANYL CITRATE (PF) 100 MCG/2ML IJ SOLN
INTRAMUSCULAR | Status: DC | PRN
  Administered 2016-04-15: 100 ug via INTRAVENOUS
  Administered 2016-04-15 (×2): 50 ug via INTRAVENOUS

## 2016-04-15 MED ORDER — PROPOFOL 10 MG/ML IV BOLUS
INTRAVENOUS | Status: DC | PRN
  Administered 2016-04-15: 200 mg via INTRAVENOUS

## 2016-04-15 MED ORDER — HYDROCODONE-ACETAMINOPHEN 5-325 MG PO TABS: 1.0000 | ORAL_TABLET | Freq: Four times a day (QID) | ORAL | 0 refills | Status: DC | PRN

## 2016-04-15 SURGICAL SUPPLY — 33 items
ADH SKN CLS APL DERMABOND .7 (GAUZE/BANDAGES/DRESSINGS) ×1
APL SKNCLS STERI-STRIP NONHPOA (GAUZE/BANDAGES/DRESSINGS)
BAG DECANTER FOR FLEXI CONT (MISCELLANEOUS) ×3 IMPLANT
BENZOIN TINCTURE PRP APPL 2/3 (GAUZE/BANDAGES/DRESSINGS) IMPLANT
BLADE SURG 15 STRL LF DISP TIS (BLADE) ×1 IMPLANT
BLADE SURG 15 STRL SS (BLADE) ×3
CHLORAPREP W/TINT 26ML (MISCELLANEOUS) ×3 IMPLANT
COVER PROBE U/S 5X48 (MISCELLANEOUS) IMPLANT
COVER SURGICAL LIGHT HANDLE (MISCELLANEOUS) ×3 IMPLANT
DECANTER SPIKE VIAL GLASS SM (MISCELLANEOUS) ×3 IMPLANT
DERMABOND ADVANCED (GAUZE/BANDAGES/DRESSINGS) ×2
DERMABOND ADVANCED .7 DNX12 (GAUZE/BANDAGES/DRESSINGS) IMPLANT
DRAPE C-ARM 42X120 X-RAY (DRAPES) ×3 IMPLANT
DRAPE LAPAROSCOPIC ABDOMINAL (DRAPES) ×3 IMPLANT
ELECT PENCIL ROCKER SW 15FT (MISCELLANEOUS) ×3 IMPLANT
ELECT REM PT RETURN 9FT ADLT (ELECTROSURGICAL) ×3
ELECTRODE REM PT RTRN 9FT ADLT (ELECTROSURGICAL) ×1 IMPLANT
GAUZE SPONGE 2X2 8PLY STRL LF (GAUZE/BANDAGES/DRESSINGS) IMPLANT
GAUZE SPONGE 4X4 16PLY XRAY LF (GAUZE/BANDAGES/DRESSINGS) ×3 IMPLANT
GLOVE SURG SIGNA 7.5 PF LTX (GLOVE) ×3 IMPLANT
GOWN STRL REUS W/TWL XL LVL3 (GOWN DISPOSABLE) ×6 IMPLANT
KIT BASIN OR (CUSTOM PROCEDURE TRAY) ×3 IMPLANT
KIT PORT POWER 8FR ISP CVUE (Catheter) ×2 IMPLANT
NDL HYPO 25X1 1.5 SAFETY (NEEDLE) ×1 IMPLANT
NEEDLE HYPO 25X1 1.5 SAFETY (NEEDLE) ×3 IMPLANT
PACK BASIC VI WITH GOWN DISP (CUSTOM PROCEDURE TRAY) ×3 IMPLANT
SPONGE GAUZE 2X2 STER 10/PKG (GAUZE/BANDAGES/DRESSINGS)
SUT MNCRL AB 4-0 PS2 18 (SUTURE) ×3 IMPLANT
SUT VIC AB 3-0 SH 18 (SUTURE) ×3 IMPLANT
SYR 10ML ECCENTRIC (SYRINGE) ×3 IMPLANT
SYR CONTROL 10ML LL (SYRINGE) ×3 IMPLANT
TOWEL OR 17X26 10 PK STRL BLUE (TOWEL DISPOSABLE) ×3 IMPLANT
TOWEL OR NON WOVEN STRL DISP B (DISPOSABLE) ×3 IMPLANT

## 2016-04-15 NOTE — Anesthesia Procedure Notes (Signed)
Procedure Name: LMA Insertion Date/Time: 04/15/2016 9:30 AM Performed by: Lind Covert Pre-anesthesia Checklist: Patient identified, Emergency Drugs available, Suction available, Patient being monitored and Timeout performed Patient Re-evaluated:Patient Re-evaluated prior to inductionOxygen Delivery Method: Circle system utilized Preoxygenation: Pre-oxygenation with 100% oxygen Intubation Type: IV induction LMA: LMA flexible inserted LMA Size: 4.0 Number of attempts: 1 Placement Confirmation: positive ETCO2 and breath sounds checked- equal and bilateral Tube secured with: Tape Dental Injury: Teeth and Oropharynx as per pre-operative assessment

## 2016-04-15 NOTE — Interval H&P Note (Signed)
History and Physical Interval Note:  04/15/2016 9:05 AM  Susan Morrison  has presented today for surgery, with the diagnosis of LEFT BREAST CANCER  The various methods of treatment have been discussed with the patient and family.  Boyfriend and mother here with patient.  After consideration of risks, benefits and other options for treatment, the patient has consented to  Procedure(s): INSERTION PORT-A-CATH WITH [place] (N/A) as a surgical intervention .  The patient's history has been reviewed, patient examined, no change in status, stable for surgery.  I have reviewed the patient's chart and labs.  Questions were answered to the patient's satisfaction.     Johnye Kist H

## 2016-04-15 NOTE — Anesthesia Preprocedure Evaluation (Addendum)
Anesthesia Evaluation  Patient identified by MRN, date of birth, ID band Patient awake    Reviewed: Allergy & Precautions, NPO status , Patient's Chart, lab work & pertinent test results  Airway Mallampati: III  TM Distance: >3 FB Neck ROM: Full    Dental  (+) Teeth Intact, Dental Advisory Given   Pulmonary neg pulmonary ROS,    Pulmonary exam normal breath sounds clear to auscultation       Cardiovascular negative cardio ROS Normal cardiovascular exam Rhythm:Regular Rate:Normal     Neuro/Psych negative neurological ROS  negative psych ROS   GI/Hepatic negative GI ROS, Neg liver ROS, GERD  Medicated,  Endo/Other  Obesity   Renal/GU negative Renal ROS     Musculoskeletal negative musculoskeletal ROS (+)   Abdominal   Peds  Hematology  (+) Blood dyscrasia, anemia ,   Anesthesia Other Findings Day of surgery medications reviewed with the patient.  Left breast cancer  Reproductive/Obstetrics                            Anesthesia Physical Anesthesia Plan  ASA: II  Anesthesia Plan: General   Post-op Pain Management:    Induction: Intravenous  Airway Management Planned: LMA  Additional Equipment:   Intra-op Plan:   Post-operative Plan: Extubation in OR  Informed Consent: I have reviewed the patients History and Physical, chart, labs and discussed the procedure including the risks, benefits and alternatives for the proposed anesthesia with the patient or authorized representative who has indicated his/her understanding and acceptance.   Dental advisory given  Plan Discussed with: CRNA  Anesthesia Plan Comments: (Risks/benefits of general anesthesia discussed with patient including risk of damage to teeth, lips, gum, and tongue, nausea/vomiting, allergic reactions to medications, and the possibility of heart attack, stroke and death.  All patient questions answered.  Patient wishes  to proceed.)        Anesthesia Quick Evaluation

## 2016-04-15 NOTE — Discharge Instructions (Signed)
General Anesthesia, Adult, Care After These instructions provide you with information about caring for yourself after your procedure. Your health care provider may also give you more specific instructions. Your treatment has been planned according to current medical practices, but problems sometimes occur. Call your health care provider if you have any problems or questions after your procedure. What can I expect after the procedure? After the procedure, it is common to have:  Vomiting.  A sore throat.  Mental slowness. It is common to feel:  Nauseous.  Cold or shivery.  Sleepy.  Tired.  Sore or achy, even in parts of your body where you did not have surgery. Follow these instructions at home: For at least 24 hours after the procedure:   Do not:  Participate in activities where you could fall or become injured.  Drive.  Use heavy machinery.  Drink alcohol.  Take sleeping pills or medicines that cause drowsiness.  Make important decisions or sign legal documents.  Take care of children on your own.  Rest. Eating and drinking   If you vomit, drink water, juice, or soup when you can drink without vomiting.  Drink enough fluid to keep your urine clear or pale yellow.  Make sure you have little or no nausea before eating solid foods.  Follow the diet recommended by your health care provider. General instructions   Have a responsible adult stay with you until you are awake and alert.  Return to your normal activities as told by your health care provider. Ask your health care provider what activities are safe for you.  Take over-the-counter and prescription medicines only as told by your health care provider.  If you smoke, do not smoke without supervision.  Keep all follow-up visits as told by your health care provider. This is important. Contact a health care provider if:  You continue to have nausea or vomiting at home, and medicines are not helpful.  You  cannot drink fluids or start eating again.  You cannot urinate after 8-12 hours.  You develop a skin rash.  You have fever.  You have increasing redness at the site of your procedure. Get help right away if:  You have difficulty breathing.  You have chest pain.  You have unexpected bleeding.  You feel that you are having a life-threatening or urgent problem. This information is not intended to replace advice given to you by your health care provider. Make sure you discuss any questions you have with your health care provider. Document Released: 04/29/2000 Document Revised: 06/26/2015 Document Reviewed: 01/05/2015 Elsevier Interactive Patient Education  2017 [place] TO PATIENT  Activity:  Driving - May drive tomorrow   Lifting - No lifting more than 15 pounds for 5 days, then no limit  Wound Care:   Leave incision dry for 2 days, then you may shower  Diet:  As tolerated  Follow up appointment:  Call Dr. Pollie Friar office [facility]) at [phone] for an appointment in 2 months.  Medications and dosages:  Resume your home medications.  You have a prescription for:  Vicodin  Call Dr. Lucia Gaskins or his office  [phone]) if you have:  Temperature greater than 100.4,  Persistent nausea and vomiting,  Redness, tenderness, or signs of infection (pain, swelling, redness, odor or green/yellow discharge around the site),  Difficulty breathing, headache or visual disturbances,  Any other questions or concerns you may have after discharge.  In an emergency, call 911 or  go to an Emergency Department at a nearby hospital.

## 2016-04-15 NOTE — Op Note (Signed)
04/15/2016  10:43 AM  PATIENT:  Susan Morrison, 46 y.o., female MRN: [id number] DOB: 01-11-1971  PREOP DIAGNOSIS:  LEFT BREAST CANCER, anticipate chemotherapy  POSTOP DIAGNOSIS:   Left breast cancer (7 o'clock), anticipate chemotherapy  PROCEDURE:   Procedure(s):  INSERTION PORT-A-CATH, right subclavian vein.  SURGEON:   Alphonsa Overall, M.D.  ANESTHESIA:   general  Anesthesiologist: Catalina Gravel, MD CRNA: Lind Covert, CRNA  General  EBL:  minimal  ml  COUNTS CORRECT:  YES  INDICATIONS FOR PROCEDURE:  Susan Morrison is a 46 y.o. (DOB: 1970-09-13) AA female whose primary care physician is Wilfred Lacy, NP and comes for power port placement for the treatment of left breast cancer.  Dr. Lindi Adie and Sondra Come are her treating oncologist.   The indications and risks of the surgery were explained to the patient.  The risks include, but are not limited to, infection, bleeding, pneumothorax, nerve injury, and thrombosis of the vein.  OPERATIVE NOTE:  The patient was taken to OR Room #4 at [facility].  Anesthesia was provided by Anesthesiologist: Catalina Gravel, MD CRNA: Lind Covert, CRNA.  At the beginning of the operation, the patient was given 2 gm Ancef, had a roll placed under her back, and had the upper chest/neck prepped with Chloroprep and draped.   A time out was held and the surgery checklist reviewed.   The patient was placed in Trendelenburg position.  The right subclavian vein was accessed with a 16 gauge needle and a guide wire threaded through the needle into the vein.  The position of the wire was checked with fluoroscopy.   I then developed a pocket in the upper inner aspect of the right chest for the port reservoir.  I used the [employer] for venous access.  The reservoir was sewn in place with a 3-0 Vicryl suture.  The reservoir had been flushed with dilute (10 units/cc) heparin.   I then passed the silastic tubing from the reservoir  incision to the subclavian stick site and used the 8 French introducer to pass it into the vein.  The tip of the silastic catheter was position at the junction of the SVC and the right atrium under fluoroscopy.  The silastic catheter was then attached to the port with the bayonet device.     The entire port and tubing were checked with fluoroscopy and then the port was flushed with 4 cc of concentrated heparin (100 units/cc).   The wounds were then closed with 3-0 vicryl subcutaneous sutures and the skin closed with a 4-0 Monocryl suture.  The skin was painted with DermaBond.   The patient was transferred to the recovery room in good condition.  The sponge and needle count were correct at the end of the case.  A CXR is ordered for port placement and pending at the time of this note.  Alphonsa Overall, MD, [facility] Pager: [phone] Office phone:  [phone]

## 2016-04-15 NOTE — Transfer of Care (Signed)
Immediate Anesthesia Transfer of Care Note  Patient: Susan Morrison  Procedure(s) Performed: Procedure(s): INSERTION PORT-A-CATH WITH [place] (N/A)  Patient Location: PACU  Anesthesia Type:General  Level of Consciousness: sedated  Airway & Oxygen Therapy: Patient Spontanous Breathing and Patient connected to face mask oxygen  Post-op Assessment: Report given to RN and Post -op Vital signs reviewed and stable  Post vital signs: Reviewed and stable  Last Vitals:  Vitals:   04/15/16 0720  BP: 124/78  Pulse: 77  Resp: 18  Temp: 37.3 C    Last Pain:  Vitals:   04/15/16 0720  TempSrc: Oral      Patients Stated Pain Goal: 4 (09/81/19 1478)  Complications: No apparent anesthesia complications

## 2016-04-15 NOTE — Anesthesia Postprocedure Evaluation (Signed)
Anesthesia Post Note  Patient: Susan Morrison  Procedure(s) Performed: Procedure(s) (LRB): INSERTION PORT-A-CATH WITH [place] (N/A)  Patient location during evaluation: PACU Anesthesia Type: General Level of consciousness: awake and alert Pain management: pain level controlled Vital Signs Assessment: post-procedure vital signs reviewed and stable Respiratory status: spontaneous breathing, nonlabored ventilation, respiratory function stable and patient connected to nasal cannula oxygen Cardiovascular status: blood pressure returned to baseline and stable Postop Assessment: no signs of nausea or vomiting Anesthetic complications: no       Last Vitals:  Vitals:   04/15/16 1200 04/15/16 1223  BP: 113/63 (P) 111/62  Pulse: 62 (!) (P) 59  Resp: 12 (P) 16  Temp: 36.8 C     Last Pain:  Vitals:   04/15/16 1223  TempSrc:   PainSc: (P) 0-No pain                 Catalina Gravel

## 2016-04-16 ENCOUNTER — Encounter (HOSPITAL_COMMUNITY): Payer: Self-pay | Admitting: Surgery

## 2016-04-17 ENCOUNTER — Other Ambulatory Visit (HOSPITAL_BASED_OUTPATIENT_CLINIC_OR_DEPARTMENT_OTHER): Payer: Medicaid Other

## 2016-04-17 ENCOUNTER — Encounter: Payer: Self-pay | Admitting: Hematology and Oncology

## 2016-04-17 ENCOUNTER — Encounter: Payer: Self-pay | Admitting: *Deleted

## 2016-04-17 ENCOUNTER — Ambulatory Visit (HOSPITAL_BASED_OUTPATIENT_CLINIC_OR_DEPARTMENT_OTHER): Payer: Medicaid Other | Admitting: Hematology and Oncology

## 2016-04-17 ENCOUNTER — Ambulatory Visit (HOSPITAL_BASED_OUTPATIENT_CLINIC_OR_DEPARTMENT_OTHER): Payer: Medicaid Other

## 2016-04-17 VITALS — BP 139/65 | HR 65 | Temp 97.9°F | Resp 18 | Ht 66.5 in | Wt 207.7 lb

## 2016-04-17 DIAGNOSIS — Z171 Estrogen receptor negative status [ER-]: Secondary | ICD-10-CM | POA: Diagnosis not present

## 2016-04-17 DIAGNOSIS — C50312 Malignant neoplasm of lower-inner quadrant of left female breast: Secondary | ICD-10-CM | POA: Diagnosis present

## 2016-04-17 DIAGNOSIS — Z803 Family history of malignant neoplasm of breast: Secondary | ICD-10-CM | POA: Diagnosis not present

## 2016-04-17 DIAGNOSIS — Z5111 Encounter for antineoplastic chemotherapy: Secondary | ICD-10-CM

## 2016-04-17 DIAGNOSIS — C773 Secondary and unspecified malignant neoplasm of axilla and upper limb lymph nodes: Secondary | ICD-10-CM | POA: Diagnosis not present

## 2016-04-17 DIAGNOSIS — N1330 Unspecified hydronephrosis: Secondary | ICD-10-CM | POA: Diagnosis not present

## 2016-04-17 DIAGNOSIS — D500 Iron deficiency anemia secondary to blood loss (chronic): Secondary | ICD-10-CM

## 2016-04-17 DIAGNOSIS — D509 Iron deficiency anemia, unspecified: Secondary | ICD-10-CM

## 2016-04-17 LAB — COMPREHENSIVE METABOLIC PANEL
ALT: 71 U/L — ABNORMAL HIGH (ref 0–55)
AST: 51 U/L — ABNORMAL HIGH (ref 5–34)
Albumin: 3.3 g/dL — ABNORMAL LOW (ref 3.5–5.0)
Alkaline Phosphatase: 50 U/L (ref 40–150)
Anion Gap: 10 mEq/L (ref 3–11)
BUN: 10.9 mg/dL (ref 7.0–26.0)
CO2: 20 meq/L — AB (ref 22–29)
Calcium: 8.9 mg/dL (ref 8.4–10.4)
Chloride: 110 mEq/L — ABNORMAL HIGH (ref 98–109)
Creatinine: 0.7 mg/dL (ref 0.6–1.1)
GLUCOSE: 110 mg/dL (ref 70–140)
POTASSIUM: 3.5 meq/L (ref 3.5–5.1)
SODIUM: 140 meq/L (ref 136–145)
Total Bilirubin: 0.99 mg/dL (ref 0.20–1.20)
Total Protein: 6.8 g/dL (ref 6.4–8.3)

## 2016-04-17 LAB — CBC WITH DIFFERENTIAL/PLATELET
BASO%: 0.3 % (ref 0.0–2.0)
BASOS ABS: 0 10*3/uL (ref 0.0–0.1)
EOS%: 1.3 % (ref 0.0–7.0)
Eosinophils Absolute: 0.1 10*3/uL (ref 0.0–0.5)
HCT: 31.3 % — ABNORMAL LOW (ref 34.8–46.6)
HGB: 8.9 g/dL — ABNORMAL LOW (ref 11.6–15.9)
LYMPH%: 29.7 % (ref 14.0–49.7)
MCH: 16.9 pg — AB (ref 25.1–34.0)
MCHC: 28.4 g/dL — AB (ref 31.5–36.0)
MCV: 59.5 fL — AB (ref 79.5–101.0)
MONO#: 0.7 10*3/uL (ref 0.1–0.9)
MONO%: 8.5 % (ref 0.0–14.0)
NEUT#: 4.6 10*3/uL (ref 1.5–6.5)
NEUT%: 60.2 % (ref 38.4–76.8)
Platelets: 230 10*3/uL (ref 145–400)
RBC: 5.26 10*6/uL (ref 3.70–5.45)
RDW: 26.9 % — AB (ref 11.2–14.5)
WBC: 7.6 10*3/uL (ref 3.9–10.3)
lymph#: 2.3 10*3/uL (ref 0.9–3.3)
nRBC: 0 % (ref 0–0)

## 2016-04-17 MED ORDER — SODIUM CHLORIDE 0.9 % IV SOLN
600.0000 mg/m2 | Freq: Once | INTRAVENOUS | Status: AC
  Administered 2016-04-17: 1260 mg via INTRAVENOUS
  Filled 2016-04-17: qty 63

## 2016-04-17 MED ORDER — PALONOSETRON HCL INJECTION 0.25 MG/5ML
INTRAVENOUS | Status: AC
  Filled 2016-04-17: qty 5

## 2016-04-17 MED ORDER — SODIUM CHLORIDE 0.9 % IV SOLN
Freq: Once | INTRAVENOUS | Status: AC
  Administered 2016-04-17: 10:00:00 via INTRAVENOUS

## 2016-04-17 MED ORDER — SODIUM CHLORIDE 0.9% FLUSH
10.0000 mL | INTRAVENOUS | Status: DC | PRN
  Administered 2016-04-17: 10 mL
  Filled 2016-04-17: qty 10

## 2016-04-17 MED ORDER — HEPARIN SOD (PORK) LOCK FLUSH 100 UNIT/ML IV SOLN
500.0000 [IU] | Freq: Once | INTRAVENOUS | Status: AC | PRN
  Administered 2016-04-17: 500 [IU]
  Filled 2016-04-17: qty 5

## 2016-04-17 MED ORDER — PALONOSETRON HCL INJECTION 0.25 MG/5ML
0.2500 mg | Freq: Once | INTRAVENOUS | Status: AC
  Administered 2016-04-17: 0.25 mg via INTRAVENOUS

## 2016-04-17 MED ORDER — PEGFILGRASTIM 6 MG/0.6ML ~~LOC~~ PSKT
6.0000 mg | PREFILLED_SYRINGE | Freq: Once | SUBCUTANEOUS | Status: AC
  Administered 2016-04-17: 6 mg via SUBCUTANEOUS
  Filled 2016-04-17: qty 0.6

## 2016-04-17 MED ORDER — FERUMOXYTOL INJECTION 510 MG/17 ML
510.0000 mg | Freq: Once | INTRAVENOUS | Status: AC
  Administered 2016-04-17: 510 mg via INTRAVENOUS
  Filled 2016-04-17: qty 17

## 2016-04-17 MED ORDER — DOXORUBICIN HCL CHEMO IV INJECTION 2 MG/ML
60.0000 mg/m2 | Freq: Once | INTRAVENOUS | Status: AC
  Administered 2016-04-17: 126 mg via INTRAVENOUS
  Filled 2016-04-17: qty 63

## 2016-04-17 MED ORDER — SODIUM CHLORIDE 0.9 % IV SOLN
Freq: Once | INTRAVENOUS | Status: AC
  Administered 2016-04-17: 09:00:00 via INTRAVENOUS

## 2016-04-17 MED ORDER — SODIUM CHLORIDE 0.9 % IV SOLN
Freq: Once | INTRAVENOUS | Status: AC
  Administered 2016-04-17: 10:00:00 via INTRAVENOUS
  Filled 2016-04-17: qty 5

## 2016-04-17 MED FILL — ONDANSETRON HCL 8 MG TABLET: 8 | 15 days supply | Qty: 30 | Fill #0

## 2016-04-17 MED FILL — LORazepam 0.5 MG TABS: 0.5 | 7 days supply | Qty: 30 | Fill #0

## 2016-04-17 MED FILL — LIDOCAINE-PRILOCAINE CREAM: 2.5-2.5 | 10 days supply | Qty: 30 | Fill #0

## 2016-04-17 MED FILL — PROCHLORPERAZINE 10 MG TAB: 10 | 7 days supply | Qty: 30 | Fill #0

## 2016-04-17 NOTE — Assessment & Plan Note (Addendum)
03/22/2016: Left breast biopsy 8:00: IDC high-grade with necrosis, left axillary lymph node biopsy IDC, ER 0%, PR 0%, HER-2 negative ratio 1.42, Ki-67 90%; palpable lump 3.5 x 2.6 x 2.2 cm and 2.8 Center left axillary lymph node, T2 N1 stage IIB  Treatment plan  based on multidisciplinary tumor board: 1. Neoadjuvant chemotherapy with Adriamycin and Cytoxan dose dense 4 followed by Taxol weekly 12 2. Followed by breast conserving surgery with sentinel lymph node study vs targeted axillary dissection 3. Followed by adjuvant radiation therapy --------------------------------------------------------------------------------------------------------------------------------------------- Current treatment: Cycle 1 day 1 dose dense Adriamycin Cytoxan Antiemetics were reviewed Chemotherapy consent obtained Chemotherapy education completed Echocardiogram 04/09/2016: EF 55-60% Closely monitoring for chemotherapy toxicities. Return to clinic in one week for toxicity check

## 2016-04-17 NOTE — Progress Notes (Signed)
Pt is approved for the $1000 [employer] grant.  

## 2016-04-17 NOTE — Progress Notes (Signed)
Patient Care Team: Flossie Buffy, NP as PCP - General (Internal Medicine) Alphonsa Overall, MD as Consulting Physician (General Surgery) Nicholas Lose, MD as Consulting Physician (Hematology and Oncology) Eppie Gibson, MD as Attending Physician (Radiation Oncology)  DIAGNOSIS:  Encounter Diagnosis  Name Primary?  . 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 Today is cycle 1 day 1. 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 She also had additional biopsies which came back as intraductal papilloma and complex sclerosing lesions.  REVIEW OF SYSTEMS:   Constitutional: Denies fevers, chills or abnormal weight loss Eyes: Denies blurriness of vision Ears, nose, mouth, throat, and face: Denies mucositis or sore throat Respiratory: Denies cough, dyspnea or  wheezes Cardiovascular: Denies palpitation, chest discomfort Gastrointestinal:  Denies nausea, heartburn or change in bowel habits Skin: Denies abnormal skin rashes Lymphatics: Denies new lymphadenopathy or easy bruising Neurological:Denies numbness, tingling or new weaknesses Behavioral/Psych: Mood is stable, no new changes  Extremities: No lower extremity edema Breast:  denies any pain or lumps or nodules in either breasts All other systems were reviewed with the patient and are negative.  I have reviewed the past medical history, past surgical history, social history and family history with the patient and they are unchanged from previous note.  ALLERGIES:  has No Known Allergies.  MEDICATIONS:  Current Outpatient Prescriptions  Medication Sig Dispense Refill  . HYDROcodone-acetaminophen (NORCO/VICODIN) 5-325 MG tablet Take 1-2 tablets by mouth every 6 (six) hours as needed. 20 tablet 0  . lidocaine-prilocaine (EMLA) cream Apply to affected area once 30 g 3  . LORazepam (ATIVAN) 0.5 MG tablet Take 1 tablet (0.5 mg total) by mouth every 6 (six) hours as needed (Nausea or vomiting). 30 tablet 0  . Multiple Vitamins-Minerals (WOMENS MULTI VITAMIN & MINERAL) TABS Take 1 tablet by mouth daily after breakfast. 30 tablet 3  . ondansetron (ZOFRAN) 8 MG tablet Take 1 tablet (8 mg total) by mouth 2 (two) times daily as needed. Start on the third day after chemotherapy. 30 tablet 1  . prochlorperazine (COMPAZINE) 10 MG tablet Take 1 tablet (10 mg total) by mouth every 6 (six) hours as needed (Nausea or vomiting). 30 tablet 1   No current facility-administered medications for this visit.    Facility-Administered Medications Ordered in Other Visits  Medication Dose Route Frequency Provider Last Rate Last Dose  .  cyclophosphamide (CYTOXAN) 1,260 mg in sodium chloride 0.9 % 250 mL chemo infusion  600 mg/m2 (Treatment Plan Recorded) Intravenous Once Nicholas Lose, MD      . DOXOrubicin (ADRIAMYCIN)  chemo injection 126 mg  60 mg/m2 (Treatment Plan Recorded) Intravenous Once Nicholas Lose, MD      . fosaprepitant (EMEND) 150 mg, dexamethasone (DECADRON) 12 mg in sodium chloride 0.9 % 145 mL IVPB   Intravenous Once Nicholas Lose, MD 454 mL/hr at 04/17/16 1029    . heparin lock flush 100 unit/mL  500 Units Intracatheter Once PRN Nicholas Lose, MD      . pegfilgrastim (NEULASTA ONPRO KIT) injection 6 mg  6 mg Subcutaneous Once Nicholas Lose, MD      . sodium chloride flush (NS) 0.9 % injection 10 mL  10 mL Intracatheter PRN Nicholas Lose, MD        PHYSICAL EXAMINATION: ECOG PERFORMANCE STATUS: 1 - Symptomatic but completely ambulatory  Vitals:   04/17/16 0827  BP: 139/65  Pulse: 65  Resp: 18  Temp: 97.9 F (36.6 C)   Filed Weights   04/17/16 0827  Weight: 207 lb 11.2 oz (94.2 kg)    GENERAL:alert, no distress and comfortable SKIN: skin color, texture, turgor are normal, no rashes or significant lesions EYES: normal, Conjunctiva are pink and non-injected, sclera clear OROPHARYNX:no exudate, no erythema and lips, buccal mucosa, and tongue normal  NECK: supple, thyroid normal size, non-tender, without nodularity LYMPH:  no palpable lymphadenopathy in the cervical, axillary or inguinal LUNGS: clear to auscultation and percussion with normal breathing effort HEART: regular rate & rhythm and no murmurs and no lower extremity edema ABDOMEN:abdomen soft, non-tender and normal bowel sounds MUSCULOSKELETAL:no cyanosis of digits and no clubbing  NEURO: alert & oriented x 3 with fluent speech, no focal motor/sensory deficits EXTREMITIES: No lower extremity edema  LABORATORY DATA:  I have reviewed the data as listed   Chemistry      Component Value Date/Time   NA 140 04/17/2016 0813   K 3.5 04/17/2016 0813   CL 109 03/05/2016 0959   CO2 20 (L) 04/17/2016 0813   BUN 10.9 04/17/2016 0813   CREATININE 0.7 04/17/2016 0813      Component Value Date/Time   CALCIUM 8.9 04/17/2016 0813    ALKPHOS 50 04/17/2016 0813   AST 51 (H) 04/17/2016 0813   ALT 71 (H) 04/17/2016 0813   BILITOT 0.99 04/17/2016 0813       Lab Results  Component Value Date   WBC 7.6 04/17/2016   HGB 8.9 (L) 04/17/2016   HCT 31.3 (L) 04/17/2016   MCV 59.5 (L) 04/17/2016   PLT 230 04/17/2016   NEUTROABS 4.6 04/17/2016    ASSESSMENT & PLAN:  Malignant neoplasm of lower-inner quadrant of left breast in female, estrogen receptor negative ([place]) 03/22/2016: Left breast biopsy 8:00: IDC high-grade with necrosis, left axillary lymph node biopsy IDC, ER 0%, PR 0%, HER-2 negative ratio 1.42, Ki-67 90%; palpable lump 3.5 x 2.6 x 2.2 cm and 2.8 Center left axillary lymph node, T2 N1 stage IIB  Treatment plan  based on multidisciplinary tumor board: 1. Neoadjuvant chemotherapy with Adriamycin and Cytoxan dose dense 4 followed by Taxol weekly 12 2. Followed by breast conserving surgery with sentinel lymph node study vs targeted axillary dissection 3. Followed by adjuvant radiation therapy --------------------------------------------------------------------------------------------------------------------------------------------- Current treatment: Cycle 1 day 1 dose dense Adriamycin Cytoxan Antiemetics were reviewed Chemotherapy consent obtained Chemotherapy education completed Echocardiogram 04/09/2016: EF 55-60% Closely monitoring for chemotherapy toxicities.  Renal issues with the left hydronephrosis and chronic ureteropelvic obstruction: I will refer her to urology.  Return to clinic in one week for toxicity check    I spent 25 minutes talking to the patient of which more than half was spent in counseling and coordination of care.  No orders of the defined types were placed in this encounter.  The patient has a good understanding of the overall plan. she agrees with it. she will call with any problems that may develop before the next visit here.   Rulon Eisenmenger, MD 04/17/16

## 2016-04-17 NOTE — Patient Instructions (Addendum)
[facility] Discharge Instructions for Patients Receiving Chemotherapy  Today you received the following chemotherapy agents: Cytoxan & Adriamycin.  To help prevent nausea and vomiting after your treatment, we encourage you to take your nausea medication (Compazine) as directed. May take Zofran medication for nausea in 72 hours.   If you develop nausea and vomiting that is not controlled by your nausea medication, call the clinic.   BELOW ARE SYMPTOMS THAT SHOULD BE REPORTED IMMEDIATELY:  *FEVER GREATER THAN 100.5 F  *CHILLS WITH OR WITHOUT FEVER  NAUSEA AND VOMITING THAT IS NOT CONTROLLED WITH YOUR NAUSEA MEDICATION  *UNUSUAL SHORTNESS OF BREATH  *UNUSUAL BRUISING OR BLEEDING  TENDERNESS IN MOUTH AND THROAT WITH OR WITHOUT PRESENCE OF ULCERS  *URINARY PROBLEMS  *BOWEL PROBLEMS  UNUSUAL RASH Items with * indicate a potential emergency and should be followed up as soon as possible.  Feel free to call the clinic you have any questions or concerns. The clinic phone number is [phone].  Please show the [place] at check-in to the Emergency Department and triage nurse.  Doxorubicin injection What is this medicine? DOXORUBICIN (dox oh ROO bi sin) is a chemotherapy drug. It is used to treat many kinds of cancer like leukemia, lymphoma, neuroblastoma, sarcoma, and Wilms' tumor. It is also used to treat bladder cancer, breast cancer, lung cancer, ovarian cancer, stomach cancer, and thyroid cancer. This medicine may be used for other purposes; ask your health care provider or pharmacist if you have questions. COMMON BRAND NAME(S): Adriamycin, Adriamycin PFS, Adriamycin RDF, Rubex What should I tell my health care provider before I take this medicine? They need to know if you have any of these conditions: -heart disease -history of low blood counts caused by a medicine -liver disease -recent or ongoing radiation therapy -an unusual or allergic reaction to  doxorubicin, other chemotherapy agents, other medicines, foods, dyes, or preservatives -pregnant or trying to get pregnant -breast-feeding How should I use this medicine? This drug is given as an infusion into a vein. It is administered in a hospital or clinic by a specially trained health care professional. If you have pain, swelling, burning or any unusual feeling around the site of your injection, tell your health care professional right away. Talk to your pediatrician regarding the use of this medicine in children. Special care may be needed. Overdosage: If you think you have taken too much of this medicine contact a poison control center or emergency room at once. NOTE: This medicine is only for you. Do not share this medicine with others. What if I miss a dose? It is important not to miss your dose. Call your doctor or health care professional if you are unable to keep an appointment. What may interact with this medicine? This medicine may interact with the following medications: -6-mercaptopurine -paclitaxel -phenytoin -St. John's Wort -trastuzumab -verapamil This list may not describe all possible interactions. Give your health care provider a list of all the medicines, herbs, non-prescription drugs, or dietary supplements you use. Also tell them if you smoke, drink alcohol, or use illegal drugs. Some items may interact with your medicine. What should I watch for while using this medicine? This drug may make you feel generally unwell. This is not uncommon, as chemotherapy can affect healthy cells as well as cancer cells. Report any side effects. Continue your course of treatment even though you feel ill unless your doctor tells you to stop. There is a maximum amount of this medicine you  should receive throughout your life. The amount depends on the medical condition being treated and your overall health. Your doctor will watch how much of this medicine you receive in your lifetime. Tell  your doctor if you have taken this medicine before. You may need blood work done while you are taking this medicine. Your urine may turn red for a few days after your dose. This is not blood. If your urine is dark or brown, call your doctor. In some cases, you may be given additional medicines to help with side effects. Follow all directions for their use. Call your doctor or health care professional for advice if you get a fever, chills or sore throat, or other symptoms of a cold or flu. Do not treat yourself. This drug decreases your body's ability to fight infections. Try to avoid being around people who are sick. This medicine may increase your risk to bruise or bleed. Call your doctor or health care professional if you notice any unusual bleeding. Talk to your doctor about your risk of cancer. You may be more at risk for certain types of cancers if you take this medicine. Do not become pregnant while taking this medicine or for 6 months after stopping it. Women should inform their doctor if they wish to become pregnant or think they might be pregnant. Men should not father a child while taking this medicine and for 6 months after stopping it. There is a potential for serious side effects to an unborn child. Talk to your health care professional or pharmacist for more information. Do not breast-feed an infant while taking this medicine. This medicine has caused ovarian failure in some women and reduced sperm counts in some men This medicine may interfere with the ability to have a child. Talk with your doctor or health care professional if you are concerned about your fertility. What side effects may I notice from receiving this medicine? Side effects that you should report to your doctor or health care professional as soon as possible: -allergic reactions like skin rash, itching or hives, swelling of the face, lips, or tongue -breathing problems -chest pain -fast or irregular heartbeat -low blood  counts - this medicine may decrease the number of white blood cells, red blood cells and platelets. You may be at increased risk for infections and bleeding. -pain, redness, or irritation at site where injected -signs of infection - fever or chills, cough, sore throat, pain or difficulty passing urine -signs of decreased platelets or bleeding - bruising, pinpoint red spots on the skin, black, tarry stools, blood in the urine -swelling of the ankles, feet, hands -tiredness -weakness Side effects that usually do not require medical attention (report to your doctor or health care professional if they continue or are bothersome): -diarrhea -hair loss -mouth sores -nail discoloration or damage -nausea -red colored urine -vomiting This list may not describe all possible side effects. Call your doctor for medical advice about side effects. You may report side effects to FDA at 1-800-FDA-1088. Where should I keep my medicine? This drug is given in a hospital or clinic and will not be stored at home. NOTE: This sheet is a summary. It may not cover all possible information. If you have questions about this medicine, talk to your doctor, pharmacist, or health care provider.  2018 Elsevier/Gold Standard (2015-03-20 11:28:51) Cyclophosphamide injection What is this medicine? CYCLOPHOSPHAMIDE (sye kloe FOSS fa mide) is a chemotherapy drug. It slows the growth of cancer cells. This medicine is used  to treat many types of cancer like lymphoma, myeloma, leukemia, breast cancer, and ovarian cancer, to name a few. This medicine may be used for other purposes; ask your health care provider or pharmacist if you have questions. COMMON BRAND NAME(S): Cytoxan, Neosar What should I tell my health care provider before I take this medicine? They need to know if you have any of these conditions: -blood disorders -history of other chemotherapy -infection -kidney disease -liver disease -recent or ongoing  radiation therapy -tumors in the bone marrow -an unusual or allergic reaction to cyclophosphamide, other chemotherapy, other medicines, foods, dyes, or preservatives -pregnant or trying to get pregnant -breast-feeding How should I use this medicine? This drug is usually given as an injection into a vein or muscle or by infusion into a vein. It is administered in a hospital or clinic by a specially trained health care professional. Talk to your pediatrician regarding the use of this medicine in children. Special care may be needed. Overdosage: If you think you have taken too much of this medicine contact a poison control center or emergency room at once. NOTE: This medicine is only for you. Do not share this medicine with others. What if I miss a dose? It is important not to miss your dose. Call your doctor or health care professional if you are unable to keep an appointment. What may interact with this medicine? This medicine may interact with the following medications: -amiodarone -amphotericin B -azathioprine -certain antiviral medicines for HIV or AIDS such as protease inhibitors (e.g., indinavir, ritonavir) and zidovudine -certain blood pressure medications such as benazepril, captopril, enalapril, fosinopril, lisinopril, moexipril, monopril, perindopril, quinapril, ramipril, trandolapril -certain cancer medications such as anthracyclines (e.g., daunorubicin, doxorubicin), busulfan, cytarabine, paclitaxel, pentostatin, tamoxifen, trastuzumab -certain diuretics such as chlorothiazide, chlorthalidone, hydrochlorothiazide, indapamide, metolazone -certain medicines that treat or prevent blood clots like warfarin -certain muscle relaxants such as succinylcholine -cyclosporine -etanercept -indomethacin -medicines to increase blood counts like filgrastim, pegfilgrastim, sargramostim -medicines used as general anesthesia -metronidazole -natalizumab This list may not describe all possible  interactions. Give your health care provider a list of all the medicines, herbs, non-prescription drugs, or dietary supplements you use. Also tell them if you smoke, drink alcohol, or use illegal drugs. Some items may interact with your medicine. What should I watch for while using this medicine? Visit your doctor for checks on your progress. This drug may make you feel generally unwell. This is not uncommon, as chemotherapy can affect healthy cells as well as cancer cells. Report any side effects. Continue your course of treatment even though you feel ill unless your doctor tells you to stop. Drink water or other fluids as directed. Urinate often, even at night. In some cases, you may be given additional medicines to help with side effects. Follow all directions for their use. Call your doctor or health care professional for advice if you get a fever, chills or sore throat, or other symptoms of a cold or flu. Do not treat yourself. This drug decreases your body's ability to fight infections. Try to avoid being around people who are sick. This medicine may increase your risk to bruise or bleed. Call your doctor or health care professional if you notice any unusual bleeding. Be careful brushing and flossing your teeth or using a toothpick because you may get an infection or bleed more easily. If you have any dental work done, tell your dentist you are receiving this medicine. You may get drowsy or dizzy. Do not drive, use  machinery, or do anything that needs mental alertness until you know how this medicine affects you. Do not become pregnant while taking this medicine or for 1 year after stopping it. Women should inform their doctor if they wish to become pregnant or think they might be pregnant. Men should not father a child while taking this medicine and for 4 months after stopping it. There is a potential for serious side effects to an unborn child. Talk to your health care professional or pharmacist for  more information. Do not breast-feed an infant while taking this medicine. This medicine may interfere with the ability to have a child. This medicine has caused ovarian failure in some women. This medicine has caused reduced sperm counts in some men. You should talk with your doctor or health care professional if you are concerned about your fertility. If you are going to have surgery, tell your doctor or health care professional that you have taken this medicine. What side effects may I notice from receiving this medicine? Side effects that you should report to your doctor or health care professional as soon as possible: -allergic reactions like skin rash, itching or hives, swelling of the face, lips, or tongue -low blood counts - this medicine may decrease the number of white blood cells, red blood cells and platelets. You may be at increased risk for infections and bleeding. -signs of infection - fever or chills, cough, sore throat, pain or difficulty passing urine -signs of decreased platelets or bleeding - bruising, pinpoint red spots on the skin, black, tarry stools, blood in the urine -signs of decreased red blood cells - unusually weak or tired, fainting spells, lightheadedness -breathing problems -dark urine -dizziness -palpitations -swelling of the ankles, feet, hands -trouble passing urine or change in the amount of urine -weight gain -yellowing of the eyes or skin Side effects that usually do not require medical attention (report to your doctor or health care professional if they continue or are bothersome): -changes in nail or skin color -hair loss -missed menstrual periods -mouth sores -nausea, vomiting This list may not describe all possible side effects. Call your doctor for medical advice about side effects. You may report side effects to FDA at 1-800-FDA-1088. Where should I keep my medicine? This drug is given in a hospital or clinic and will not be stored at  home. NOTE: This sheet is a summary. It may not cover all possible information. If you have questions about this medicine, talk to your doctor, pharmacist, or health care provider.  2018 Elsevier/Gold Standard (2011-12-06 16:22:58)  Ferumoxytol injection What is this medicine? FERUMOXYTOL is an iron complex. Iron is used to make healthy red blood cells, which carry oxygen and nutrients throughout the body. This medicine is used to treat iron deficiency anemia in people with chronic kidney disease. This medicine may be used for other purposes; ask your health care provider or pharmacist if you have questions. COMMON BRAND NAME(S): Feraheme What should I tell my health care provider before I take this medicine? They need to know if you have any of these conditions: -anemia not caused by low iron levels -high levels of iron in the blood -magnetic resonance imaging (MRI) test scheduled -an unusual or allergic reaction to iron, other medicines, foods, dyes, or preservatives -pregnant or trying to get pregnant -breast-feeding How should I use this medicine? This medicine is for injection into a vein. It is given by a health care professional in a hospital or clinic setting. Talk to  your pediatrician regarding the use of this medicine in children. Special care may be needed. Overdosage: If you think you have taken too much of this medicine contact a poison control center or emergency room at once. NOTE: This medicine is only for you. Do not share this medicine with others. What if I miss a dose? It is important not to miss your dose. Call your doctor or health care professional if you are unable to keep an appointment. What may interact with this medicine? This medicine may interact with the following medications: -other iron products This list may not describe all possible interactions. Give your health care provider a list of all the medicines, herbs, non-prescription drugs, or dietary  supplements you use. Also tell them if you smoke, drink alcohol, or use illegal drugs. Some items may interact with your medicine. What should I watch for while using this medicine? Visit your doctor or healthcare professional regularly. Tell your doctor or healthcare professional if your symptoms do not start to get better or if they get worse. You may need blood work done while you are taking this medicine. You may need to follow a special diet. Talk to your doctor. Foods that contain iron include: whole grains/cereals, dried fruits, beans, or peas, leafy green vegetables, and organ meats (liver, kidney). What side effects may I notice from receiving this medicine? Side effects that you should report to your doctor or health care professional as soon as possible: -allergic reactions like skin rash, itching or hives, swelling of the face, lips, or tongue -breathing problems -changes in blood pressure -feeling faint or lightheaded, falls -fever or chills -flushing, sweating, or hot feelings -swelling of the ankles or feet Side effects that usually do not require medical attention (report to your doctor or health care professional if they continue or are bothersome): -diarrhea -headache -nausea, vomiting -stomach pain This list may not describe all possible side effects. Call your doctor for medical advice about side effects. You may report side effects to FDA at 1-800-FDA-1088. Where should I keep my medicine? This drug is given in a hospital or clinic and will not be stored at home. NOTE: This sheet is a summary. It may not cover all possible information. If you have questions about this medicine, talk to your doctor, pharmacist, or health care provider.  2018 Elsevier/Gold Standard (2015-02-23 12:41:49) Pegfilgrastim injection What is this medicine? PEGFILGRASTIM (PEG fil gra stim) is a long-acting granulocyte colony-stimulating factor that stimulates the growth of neutrophils, a type of  white blood cell important in the body's fight against infection. It is used to reduce the incidence of fever and infection in patients with certain types of cancer who are receiving chemotherapy that affects the bone marrow, and to increase survival after being exposed to high doses of radiation. This medicine may be used for other purposes; ask your health care provider or pharmacist if you have questions. COMMON BRAND NAME(S): Neulasta What should I tell my health care provider before I take this medicine? They need to know if you have any of these conditions: -kidney disease -latex allergy -ongoing radiation therapy -sickle cell disease -skin reactions to acrylic adhesives (On-Body Injector only) -an unusual or allergic reaction to pegfilgrastim, filgrastim, other medicines, foods, dyes, or preservatives -pregnant or trying to get pregnant -breast-feeding How should I use this medicine? This medicine is for injection under the skin. If you get this medicine at home, you will be taught how to prepare and give the pre-filled syringe or  how to use the On-body Injector. Refer to the patient Instructions for Use for detailed instructions. Use exactly as directed. Tell your healthcare provider immediately if you suspect that the On-body Injector may not have performed as intended or if you suspect the use of the On-body Injector resulted in a missed or partial dose. It is important that you put your used needles and syringes in a special sharps container. Do not put them in a trash can. If you do not have a sharps container, call your pharmacist or healthcare provider to get one. Talk to your pediatrician regarding the use of this medicine in children. While this drug may be prescribed for selected conditions, precautions do apply. Overdosage: If you think you have taken too much of this medicine contact a poison control center or emergency room at once. NOTE: This medicine is only for you. Do not  share this medicine with others. What if I miss a dose? It is important not to miss your dose. Call your doctor or health care professional if you miss your dose. If you miss a dose due to an On-body Injector failure or leakage, a new dose should be administered as soon as possible using a single prefilled syringe for manual use. What may interact with this medicine? Interactions have not been studied. Give your health care provider a list of all the medicines, herbs, non-prescription drugs, or dietary supplements you use. Also tell them if you smoke, drink alcohol, or use illegal drugs. Some items may interact with your medicine. This list may not describe all possible interactions. Give your health care provider a list of all the medicines, herbs, non-prescription drugs, or dietary supplements you use. Also tell them if you smoke, drink alcohol, or use illegal drugs. Some items may interact with your medicine. What should I watch for while using this medicine? You may need blood work done while you are taking this medicine. If you are going to need a MRI, CT scan, or other procedure, tell your doctor that you are using this medicine (On-Body Injector only). What side effects may I notice from receiving this medicine? Side effects that you should report to your doctor or health care professional as soon as possible: -allergic reactions like skin rash, itching or hives, swelling of the face, lips, or tongue -dizziness -fever -pain, redness, or irritation at site where injected -pinpoint red spots on the skin -red or dark-brown urine -shortness of breath or breathing problems -stomach or side pain, or pain at the shoulder -swelling -tiredness -trouble passing urine or change in the amount of urine Side effects that usually do not require medical attention (report to your doctor or health care professional if they continue or are bothersome): -bone pain -muscle pain This list may not describe  all possible side effects. Call your doctor for medical advice about side effects. You may report side effects to FDA at 1-800-FDA-1088. Where should I keep my medicine? Keep out of the reach of children. Store pre-filled syringes in a refrigerator between 2 and 8 degrees C (36 and 46 degrees F). Do not freeze. Keep in carton to protect from light. Throw away this medicine if it is left out of the refrigerator for more than 48 hours. Throw away any unused medicine after the expiration date. NOTE: This sheet is a summary. It may not cover all possible information. If you have questions about this medicine, talk to your doctor, pharmacist, or health care provider.  2018 [employer]/Gold Standard (2016-01-18 12:58:03)

## 2016-04-18 ENCOUNTER — Telehealth: Payer: Self-pay | Admitting: Emergency Medicine

## 2016-04-18 NOTE — Telephone Encounter (Signed)
Called patient for chemo follow up call. Patient denies any problems at this time. Denies and NVD. States she is trying to drink water; did state she had a dry mouth this morning but drank water and this resolved. Patient states she picked up her prescriptions yesterday. Advised patient to call if she has any problems and to return as scheduled. Patient verbalized understanding.

## 2016-04-23 NOTE — Progress Notes (Signed)
Patient Care Team: Flossie Buffy, NP as PCP - General (Internal Medicine) Alphonsa Overall, MD as Consulting Physician (General Surgery) Nicholas Lose, MD as Consulting Physician (Hematology and Oncology) Eppie Gibson, MD as Attending Physician (Radiation Oncology)  DIAGNOSIS:  Encounter Diagnosis  Name Primary?  . Malignant neoplasm of lower-inner quadrant of left breast in female, estrogen receptor negative ([place]) Yes    SUMMARY OF ONCOLOGIC HISTORY:   Malignant neoplasm of lower-inner quadrant of left breast in female, estrogen receptor negative ([place])   03/22/2016 Initial Diagnosis    Left breast biopsy 8:00: IDC high-grade with necrosis, left axillary lymph node biopsy IDC, ER 0%, PR 0%, HER-2 negative ratio 1.42, Ki-67 90%; palpable lump 3.5 x 2.6 x 2.2 cm and 2.8 Center left axillary lymph node, T2 N1 stage IIB      04/10/2016 Initial Biopsy    Additional biopsies left breast 9:30: 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  She also received feraheme last week and is due to receive more today for iron deficiency anemia.  She tolerated chemotherapy relatively well.  She had one day of fatigue on Friday.  She denies nausea, vomiting, or mucositis.  She denies peripheral neuropathy.    REVIEW OF SYSTEMS:   Review of Systems  Constitutional: Negative for chills, fever and weight loss.  HENT: Negative for hearing loss and tinnitus.   Eyes: Negative for blurred vision and double vision.  Respiratory: Negative  for cough and shortness of breath.   Cardiovascular: Negative for chest pain, palpitations and leg swelling.  Gastrointestinal: Negative for abdominal pain, constipation, diarrhea, heartburn, nausea and vomiting.  Genitourinary: Negative for dysuria.  Neurological: Positive for headaches (with anti-emetics, taking tylenol if needed). Negative for dizziness and weakness.  Endo/Heme/Allergies: Negative for environmental allergies. Does not bruise/bleed easily.  Psychiatric/Behavioral: Negative for depression. The patient is not nervous/anxious.      I have reviewed the past medical history, past surgical history, social history and family history with the patient and they are unchanged from previous note.  ALLERGIES:  has No Known Allergies.  MEDICATIONS:  Current Outpatient Prescriptions  Medication Sig Dispense Refill  . lidocaine-prilocaine (EMLA) cream Apply to affected area once 30 g 3  . LORazepam (ATIVAN) 0.5 MG tablet Take 1 tablet (0.5 mg total) by mouth every 6 (six) hours as needed (Nausea or vomiting). 30 tablet 0  . Multiple Vitamins-Minerals (WOMENS MULTI VITAMIN & MINERAL) TABS Take 1 tablet by mouth daily after breakfast. 30 tablet 3  . ondansetron (ZOFRAN) 8 MG tablet Take 1 tablet (8 mg total) by mouth 2 (two) times daily as needed. Start on the third day after chemotherapy. 30 tablet 1  . prochlorperazine (COMPAZINE) 10 MG tablet Take 1 tablet (10 mg total) by mouth every 6 (six) hours as needed (Nausea or vomiting). 30 tablet 1  . HYDROcodone-acetaminophen (NORCO/VICODIN) 5-325 MG tablet Take 1-2 tablets by mouth every 6 (six) hours as needed. (Patient not taking: Reported on 04/24/2016) 20 tablet 0  No current facility-administered medications for this visit.     PHYSICAL EXAMINATION: ECOG PERFORMANCE STATUS: 1 - Symptomatic but completely ambulatory  Vitals:   04/24/16 0827  BP: 106/70  Pulse: 81  Resp: 18  Temp: 98.2 F (36.8 C)   Filed Weights   04/24/16  0827  Weight: 208 lb 3.2 oz (94.4 kg)    GENERAL: Patient is a well appearing female in no acute distress HEENT:  Sclerae anicteric.  PERRL  Oropharynx clear and moist. No ulcerations or evidence of oropharyngeal candidiasis. Neck is supple.  NODES:  No cervical, supraclavicular, or axillary lymphadenopathy palpated.  BREAST EXAM:  Deferred. LUNGS:  Clear to auscultation bilaterally.  No wheezes or rhonchi. HEART:  Regular rate and rhythm. No murmur appreciated. ABDOMEN:  Soft, nontender.  Positive, normoactive bowel sounds. No organomegaly palpated. MSK:  No focal spinal tenderness to palpation.  EXTREMITIES:  No peripheral edema.   SKIN:  Clear with no obvious rashes or skin changes. No nail dyscrasia. NEURO:  Nonfocal. Well oriented.  Appropriate affect.    LABORATORY DATA:  I have reviewed the data as listed   Chemistry      Component Value Date/Time   NA 137 04/24/2016 0759   K 4.1 04/24/2016 0759   CL 109 03/05/2016 0959   CO2 23 04/24/2016 0759   BUN 11.1 04/24/2016 0759   CREATININE 0.7 04/24/2016 0759      Component Value Date/Time   CALCIUM 9.0 04/24/2016 0759   ALKPHOS 92 04/24/2016 0759   AST 65 (H) 04/24/2016 0759   ALT 82 (H) 04/24/2016 0759   BILITOT 0.71 04/24/2016 0759       Lab Results  Component Value Date   WBC 1.5 (L) 04/24/2016   HGB 8.1 (L) 04/24/2016   HCT 27.6 (L) 04/24/2016   MCV 59.6 (L) 04/24/2016   PLT 103 (L) 04/24/2016   NEUTROABS 0.9 (L) 04/24/2016    ASSESSMENT & PLAN:  Malignant neoplasm of lower-inner quadrant of left breast in female, estrogen receptor negative ([place]) 03/22/2016: Left breast biopsy 8:00: IDC high-grade with necrosis, left axillary lymph node biopsy IDC, ER 0%, PR 0%, HER-2 negative ratio 1.42, Ki-67 90%; palpable lump 3.5 x 2.6 x 2.2 cm and 2.8 Center left axillary lymph node, T2 N1 stage IIB  Treatment plan  based on multidisciplinary tumor board: 1. Neoadjuvant chemotherapy with Adriamycin and Cytoxan dose  dense 4 followed by Taxol weekly 12 2. Followed by breast conserving surgery with sentinel lymph node study vs targeted axillary dissection 3. Followed by adjuvant radiation therapy --------------------------------------------------------------------------------------------------------------------------------------------- Current treatment: Cycle 1 day 8 dose dense Adriamycin Cytoxan Kelita tolerated her first cycle of treatment very well.  She did have some minor issues with fatigue and headaches, but seems to be managing that without difficulty.  Her WBC is slightly low, along with her ANC (900).  I reviewed this with her, and we reviewed neutropenic precautions.  I reviewed the rest of her CBC with her today as well (CMET is pending)  She continues to be anemic, and will proceed with her second week of feraheme today.  She tolerated last weeks dose without difficulty.    Renal issues with the left hydronephrosis and chronic ureteropelvic obstruction: I will follow up on the status of her urology referral.  Return to clinic in one week for labs, evaluation by Dr. Lindi Adie, and cycle 2 of [facility].      I spent 25 minutes talking to the patient of which more than half was  spent in counseling and coordination of care.   The patient has a good understanding of the overall plan. she agrees with it. she will call with any problems that may develop before the next visit here.   Scot Dock, NP 04/24/16

## 2016-04-24 ENCOUNTER — Telehealth: Payer: Self-pay

## 2016-04-24 ENCOUNTER — Other Ambulatory Visit (HOSPITAL_BASED_OUTPATIENT_CLINIC_OR_DEPARTMENT_OTHER): Payer: Medicaid Other

## 2016-04-24 ENCOUNTER — Ambulatory Visit (HOSPITAL_BASED_OUTPATIENT_CLINIC_OR_DEPARTMENT_OTHER): Payer: Medicaid Other | Admitting: Adult Health

## 2016-04-24 ENCOUNTER — Ambulatory Visit (HOSPITAL_BASED_OUTPATIENT_CLINIC_OR_DEPARTMENT_OTHER): Payer: Medicaid Other

## 2016-04-24 ENCOUNTER — Encounter: Payer: Self-pay | Admitting: Adult Health

## 2016-04-24 ENCOUNTER — Ambulatory Visit: Payer: Self-pay

## 2016-04-24 VITALS — BP 116/62 | HR 66 | Temp 98.5°F | Resp 18

## 2016-04-24 VITALS — BP 106/70 | HR 81 | Temp 98.2°F | Resp 18 | Ht 66.5 in | Wt 208.2 lb

## 2016-04-24 DIAGNOSIS — N920 Excessive and frequent menstruation with regular cycle: Secondary | ICD-10-CM

## 2016-04-24 DIAGNOSIS — Z171 Estrogen receptor negative status [ER-]: Principal | ICD-10-CM

## 2016-04-24 DIAGNOSIS — D500 Iron deficiency anemia secondary to blood loss (chronic): Secondary | ICD-10-CM

## 2016-04-24 DIAGNOSIS — C50312 Malignant neoplasm of lower-inner quadrant of left female breast: Secondary | ICD-10-CM

## 2016-04-24 DIAGNOSIS — N131 Hydronephrosis with ureteral stricture, not elsewhere classified: Secondary | ICD-10-CM

## 2016-04-24 LAB — CBC WITH DIFFERENTIAL/PLATELET
BASO%: 0.7 % (ref 0.0–2.0)
Basophils Absolute: 0 10*3/uL (ref 0.0–0.1)
EOS ABS: 0.1 10*3/uL (ref 0.0–0.5)
EOS%: 5.5 % (ref 0.0–7.0)
HEMATOCRIT: 27.6 % — AB (ref 34.8–46.6)
HEMOGLOBIN: 8.1 g/dL — AB (ref 11.6–15.9)
LYMPH#: 0.5 10*3/uL — AB (ref 0.9–3.3)
LYMPH%: 32.9 % (ref 14.0–49.7)
MCH: 17.5 pg — AB (ref 25.1–34.0)
MCHC: 29.3 g/dL — ABNORMAL LOW (ref 31.5–36.0)
MCV: 59.6 fL — ABNORMAL LOW (ref 79.5–101.0)
MONO#: 0 10*3/uL — AB (ref 0.1–0.9)
MONO%: 2.7 % (ref 0.0–14.0)
NEUT%: 58.2 % (ref 38.4–76.8)
NEUTROS ABS: 0.9 10*3/uL — AB (ref 1.5–6.5)
Platelets: 103 10*3/uL — ABNORMAL LOW (ref 145–400)
RBC: 4.63 10*6/uL (ref 3.70–5.45)
RDW: 25 % — ABNORMAL HIGH (ref 11.2–14.5)
WBC: 1.5 10*3/uL — ABNORMAL LOW (ref 3.9–10.3)
nRBC: 0 % (ref 0–0)

## 2016-04-24 LAB — COMPREHENSIVE METABOLIC PANEL
ALK PHOS: 92 U/L (ref 40–150)
ALT: 82 U/L — ABNORMAL HIGH (ref 0–55)
ANION GAP: 9 meq/L (ref 3–11)
AST: 65 U/L — ABNORMAL HIGH (ref 5–34)
Albumin: 3.3 g/dL — ABNORMAL LOW (ref 3.5–5.0)
BUN: 11.1 mg/dL (ref 7.0–26.0)
CO2: 23 mEq/L (ref 22–29)
Calcium: 9 mg/dL (ref 8.4–10.4)
Chloride: 105 mEq/L (ref 98–109)
Creatinine: 0.7 mg/dL (ref 0.6–1.1)
EGFR: >90 mL/min/{1.73_m2} (ref >90)
Glucose: 98 mg/dl (ref 70–140)
Potassium: 4.1 mEq/L (ref 3.5–5.1)
Sodium: 137 mEq/L (ref 136–145)
Total Bilirubin: 0.71 mg/dL (ref 0.20–1.20)
Total Protein: 6.5 g/dL (ref 6.4–8.3)

## 2016-04-24 MED ORDER — SODIUM CHLORIDE 0.9 % IV SOLN
Freq: Once | INTRAVENOUS | Status: AC
  Administered 2016-04-24: 10:00:00 via INTRAVENOUS

## 2016-04-24 MED ORDER — SODIUM CHLORIDE 0.9% FLUSH
10.0000 mL | INTRAVENOUS | Status: DC | PRN
  Administered 2016-04-24: 10 mL
  Filled 2016-04-24: qty 10

## 2016-04-24 MED ORDER — HEPARIN SOD (PORK) LOCK FLUSH 100 UNIT/ML IV SOLN
500.0000 [IU] | Freq: Once | INTRAVENOUS | Status: AC | PRN
  Administered 2016-04-24: 500 [IU]
  Filled 2016-04-24: qty 5

## 2016-04-24 MED ORDER — SODIUM CHLORIDE 0.9 % IV SOLN
510.0000 mg | Freq: Once | INTRAVENOUS | Status: AC
  Administered 2016-04-24: 510 mg via INTRAVENOUS
  Filled 2016-04-24: qty 17

## 2016-04-24 NOTE — Telephone Encounter (Signed)
s/w office and they do not see any referral papers for the pt. Faxed referral and OV note, labs, CT to [facility]

## 2016-04-24 NOTE — Patient Instructions (Signed)

## 2016-04-24 NOTE — Patient Instructions (Signed)
Implanted Port Home Guide An implanted port is a type of central line that is placed under the skin. Central lines are used to provide IV access when treatment or nutrition needs to be given through a person's veins. Implanted ports are used for long-term IV access. An implanted port may be placed because:  You need IV medicine that would be irritating to the small veins in your hands or arms.  You need long-term IV medicines, such as antibiotics.  You need IV nutrition for a long period.  You need frequent blood draws for lab tests.  You need dialysis.  Implanted ports are usually placed in the chest area, but they can also be placed in the upper arm, the abdomen, or the leg. An implanted port has two main parts:  Reservoir. The reservoir is round and will appear as a small, raised area under your skin. The reservoir is the part where a needle is inserted to give medicines or draw blood.  Catheter. The catheter is a thin, flexible tube that extends from the reservoir. The catheter is placed into a large vein. Medicine that is inserted into the reservoir goes into the catheter and then into the vein.  How will I care for my incision site? Do not get the incision site wet. Bathe or shower as directed by your health care provider. How is my port accessed? Special steps must be taken to access the port:  Before the port is accessed, a numbing cream can be placed on the skin. This helps numb the skin over the port site.  Your health care provider uses a sterile technique to access the port. ? Your health care provider must put on a mask and sterile gloves. ? The skin over your port is cleaned carefully with an antiseptic and allowed to dry. ? The port is gently pinched between sterile gloves, and a needle is inserted into the port.  Only "non-coring" port needles should be used to access the port. Once the port is accessed, a blood return should be checked. This helps ensure that the port  is in the vein and is not clogged.  If your port needs to remain accessed for a constant infusion, a clear (transparent) bandage will be placed over the needle site. The bandage and needle will need to be changed every week, or as directed by your health care provider.  Keep the bandage covering the needle clean and dry. Do not get it wet. Follow your health care provider's instructions on how to take a shower or bath while the port is accessed.  If your port does not need to stay accessed, no bandage is needed over the port.  What is flushing? Flushing helps keep the port from getting clogged. Follow your health care provider's instructions on how and when to flush the port. Ports are usually flushed with saline solution or a medicine called heparin. The need for flushing will depend on how the port is used.  If the port is used for intermittent medicines or blood draws, the port will need to be flushed: ? After medicines have been given. ? After blood has been drawn. ? As part of routine maintenance.  If a constant infusion is running, the port may not need to be flushed.  How long will my port stay implanted? The port can stay in for as long as your health care provider thinks it is needed. When it is time for the port to come out, surgery will be   done to remove it. The procedure is similar to the one performed when the port was put in. When should I seek immediate medical care? When you have an implanted port, you should seek immediate medical care if:  You notice a bad smell coming from the incision site.  You have swelling, redness, or drainage at the incision site.  You have more swelling or pain at the port site or the surrounding area.  You have a fever that is not controlled with medicine.  This information is not intended to replace advice given to you by your health care provider. Make sure you discuss any questions you have with your health care provider. Document  Released: 01/21/2005 Document Revised: 06/29/2015 Document Reviewed: 09/28/2012 Elsevier Interactive Patient Education  2017 [employer]vier Inc.  

## 2016-04-30 ENCOUNTER — Telehealth: Payer: Self-pay

## 2016-04-30 NOTE — Telephone Encounter (Signed)
Tara with [facility] called with pt appt 05/02/16 at 745 am. With Dr Irine Seal. Asked Baxter Flattery to call pt so she could confirm appt directly.

## 2016-05-01 ENCOUNTER — Encounter: Payer: Self-pay | Admitting: *Deleted

## 2016-05-01 ENCOUNTER — Other Ambulatory Visit: Payer: No Typology Code available for payment source

## 2016-05-01 ENCOUNTER — Encounter: Payer: Self-pay | Admitting: Hematology and Oncology

## 2016-05-01 ENCOUNTER — Ambulatory Visit: Payer: Medicaid Other

## 2016-05-01 ENCOUNTER — Other Ambulatory Visit (HOSPITAL_BASED_OUTPATIENT_CLINIC_OR_DEPARTMENT_OTHER): Payer: Medicaid Other

## 2016-05-01 ENCOUNTER — Ambulatory Visit (HOSPITAL_BASED_OUTPATIENT_CLINIC_OR_DEPARTMENT_OTHER): Payer: Medicaid Other

## 2016-05-01 ENCOUNTER — Ambulatory Visit (HOSPITAL_BASED_OUTPATIENT_CLINIC_OR_DEPARTMENT_OTHER): Payer: Medicaid Other | Admitting: Hematology and Oncology

## 2016-05-01 DIAGNOSIS — C50312 Malignant neoplasm of lower-inner quadrant of left female breast: Secondary | ICD-10-CM

## 2016-05-01 DIAGNOSIS — D509 Iron deficiency anemia, unspecified: Secondary | ICD-10-CM | POA: Diagnosis not present

## 2016-05-01 DIAGNOSIS — Z171 Estrogen receptor negative status [ER-]: Secondary | ICD-10-CM

## 2016-05-01 DIAGNOSIS — C773 Secondary and unspecified malignant neoplasm of axilla and upper limb lymph nodes: Secondary | ICD-10-CM

## 2016-05-01 DIAGNOSIS — Z5111 Encounter for antineoplastic chemotherapy: Secondary | ICD-10-CM | POA: Diagnosis not present

## 2016-05-01 DIAGNOSIS — Z95828 Presence of other vascular implants and grafts: Secondary | ICD-10-CM

## 2016-05-01 LAB — COMPREHENSIVE METABOLIC PANEL
ALK PHOS: 82 U/L (ref 40–150)
ALT: 54 U/L (ref 0–55)
ANION GAP: 9 meq/L (ref 3–11)
AST: 46 U/L — ABNORMAL HIGH (ref 5–34)
Albumin: 3.3 g/dL — ABNORMAL LOW (ref 3.5–5.0)
BILIRUBIN TOTAL: 0.64 mg/dL (ref 0.20–1.20)
BUN: 9.1 mg/dL (ref 7.0–26.0)
CO2: 24 meq/L (ref 22–29)
Calcium: 8.7 mg/dL (ref 8.4–10.4)
Chloride: 107 mEq/L (ref 98–109)
Creatinine: 0.7 mg/dL (ref 0.6–1.1)
Glucose: 89 mg/dl (ref 70–140)
Potassium: 3.8 mEq/L (ref 3.5–5.1)
Sodium: 140 mEq/L (ref 136–145)
TOTAL PROTEIN: 6.4 g/dL (ref 6.4–8.3)

## 2016-05-01 LAB — CBC WITH DIFFERENTIAL/PLATELET
BASO%: 0.5 % (ref 0.0–2.0)
Basophils Absolute: 0 10*3/uL (ref 0.0–0.1)
EOS%: 0.8 % (ref 0.0–7.0)
Eosinophils Absolute: 0.1 10*3/uL (ref 0.0–0.5)
HEMATOCRIT: 28.8 % — AB (ref 34.8–46.6)
HEMOGLOBIN: 8.6 g/dL — AB (ref 11.6–15.9)
LYMPH#: 1.2 10*3/uL (ref 0.9–3.3)
LYMPH%: 16.2 % (ref 14.0–49.7)
MCH: 19.5 pg — ABNORMAL LOW (ref 25.1–34.0)
MCHC: 29.9 g/dL — AB (ref 31.5–36.0)
MCV: 65.2 fL — ABNORMAL LOW (ref 79.5–101.0)
MONO#: 1 10*3/uL — AB (ref 0.1–0.9)
MONO%: 13 % (ref 0.0–14.0)
NEUT%: 69.5 % (ref 38.4–76.8)
NEUTROS ABS: 5.1 10*3/uL (ref 1.5–6.5)
NRBC: 1 % — AB (ref 0–0)
Platelets: 238 10*3/uL (ref 145–400)
RBC: 4.42 10*6/uL (ref 3.70–5.45)
RDW: 29.9 % — ABNORMAL HIGH (ref 11.2–14.5)
WBC: 7.4 10*3/uL (ref 3.9–10.3)

## 2016-05-01 MED ORDER — PALONOSETRON HCL INJECTION 0.25 MG/5ML
INTRAVENOUS | Status: AC
  Filled 2016-05-01: qty 5

## 2016-05-01 MED ORDER — SODIUM CHLORIDE 0.9 % IV SOLN
600.0000 mg/m2 | Freq: Once | INTRAVENOUS | Status: AC
  Administered 2016-05-01: 1260 mg via INTRAVENOUS
  Filled 2016-05-01: qty 63

## 2016-05-01 MED ORDER — SODIUM CHLORIDE 0.9 % IV SOLN
Freq: Once | INTRAVENOUS | Status: AC
  Administered 2016-05-01: 12:00:00 via INTRAVENOUS
  Filled 2016-05-01: qty 5

## 2016-05-01 MED ORDER — SODIUM CHLORIDE 0.9% FLUSH
10.0000 mL | INTRAVENOUS | Status: DC | PRN
  Administered 2016-05-01: 10 mL via INTRAVENOUS
  Filled 2016-05-01: qty 10

## 2016-05-01 MED ORDER — PEGFILGRASTIM 6 MG/0.6ML ~~LOC~~ PSKT
6.0000 mg | PREFILLED_SYRINGE | Freq: Once | SUBCUTANEOUS | Status: AC
  Administered 2016-05-01: 6 mg via SUBCUTANEOUS
  Filled 2016-05-01: qty 0.6

## 2016-05-01 MED ORDER — SODIUM CHLORIDE 0.9 % IV SOLN
Freq: Once | INTRAVENOUS | Status: AC
  Administered 2016-05-01: 12:00:00 via INTRAVENOUS

## 2016-05-01 MED ORDER — DOXORUBICIN HCL CHEMO IV INJECTION 2 MG/ML
60.0000 mg/m2 | Freq: Once | INTRAVENOUS | Status: AC
  Administered 2016-05-01: 126 mg via INTRAVENOUS
  Filled 2016-05-01: qty 63

## 2016-05-01 MED ORDER — SODIUM CHLORIDE 0.9% FLUSH
10.0000 mL | INTRAVENOUS | Status: DC | PRN
  Filled 2016-05-01: qty 10

## 2016-05-01 MED ORDER — PALONOSETRON HCL INJECTION 0.25 MG/5ML
0.2500 mg | Freq: Once | INTRAVENOUS | Status: AC
  Administered 2016-05-01: 0.25 mg via INTRAVENOUS

## 2016-05-01 MED ORDER — HEPARIN SOD (PORK) LOCK FLUSH 100 UNIT/ML IV SOLN
500.0000 [IU] | Freq: Once | INTRAVENOUS | Status: DC | PRN
  Filled 2016-05-01: qty 5

## 2016-05-01 NOTE — Patient Instructions (Signed)
Implanted Port Home Guide An implanted port is a type of central line that is placed under the skin. Central lines are used to provide IV access when treatment or nutrition needs to be given through a person's veins. Implanted ports are used for long-term IV access. An implanted port may be placed because:  You need IV medicine that would be irritating to the small veins in your hands or arms.  You need long-term IV medicines, such as antibiotics.  You need IV nutrition for a long period.  You need frequent blood draws for lab tests.  You need dialysis.  Implanted ports are usually placed in the chest area, but they can also be placed in the upper arm, the abdomen, or the leg. An implanted port has two main parts:  Reservoir. The reservoir is round and will appear as a small, raised area under your skin. The reservoir is the part where a needle is inserted to give medicines or draw blood.  Catheter. The catheter is a thin, flexible tube that extends from the reservoir. The catheter is placed into a large vein. Medicine that is inserted into the reservoir goes into the catheter and then into the vein.  How will I care for my incision site? Do not get the incision site wet. Bathe or shower as directed by your health care provider. How is my port accessed? Special steps must be taken to access the port:  Before the port is accessed, a numbing cream can be placed on the skin. This helps numb the skin over the port site.  Your health care provider uses a sterile technique to access the port. ? Your health care provider must put on a mask and sterile gloves. ? The skin over your port is cleaned carefully with an antiseptic and allowed to dry. ? The port is gently pinched between sterile gloves, and a needle is inserted into the port.  Only "non-coring" port needles should be used to access the port. Once the port is accessed, a blood return should be checked. This helps ensure that the port  is in the vein and is not clogged.  If your port needs to remain accessed for a constant infusion, a clear (transparent) bandage will be placed over the needle site. The bandage and needle will need to be changed every week, or as directed by your health care provider.  Keep the bandage covering the needle clean and dry. Do not get it wet. Follow your health care provider's instructions on how to take a shower or bath while the port is accessed.  If your port does not need to stay accessed, no bandage is needed over the port.  What is flushing? Flushing helps keep the port from getting clogged. Follow your health care provider's instructions on how and when to flush the port. Ports are usually flushed with saline solution or a medicine called heparin. The need for flushing will depend on how the port is used.  If the port is used for intermittent medicines or blood draws, the port will need to be flushed: ? After medicines have been given. ? After blood has been drawn. ? As part of routine maintenance.  If a constant infusion is running, the port may not need to be flushed.  How long will my port stay implanted? The port can stay in for as long as your health care provider thinks it is needed. When it is time for the port to come out, surgery will be   done to remove it. The procedure is similar to the one performed when the port was put in. When should I seek immediate medical care? When you have an implanted port, you should seek immediate medical care if:  You notice a bad smell coming from the incision site.  You have swelling, redness, or drainage at the incision site.  You have more swelling or pain at the port site or the surrounding area.  You have a fever that is not controlled with medicine.  This information is not intended to replace advice given to you by your health care provider. Make sure you discuss any questions you have with your health care provider. Document  Released: 01/21/2005 Document Revised: 06/29/2015 Document Reviewed: 09/28/2012 Elsevier Interactive Patient Education  2017 [employer]vier Inc.  

## 2016-05-01 NOTE — Assessment & Plan Note (Signed)
03/22/2016: Left breast biopsy 8:00: IDC high-grade with necrosis, left axillary lymph node biopsy IDC, ER 0%, PR 0%, HER-2 negative ratio 1.42, Ki-67 90%; palpable lump 3.5 x 2.6 x 2.2 cm and 2.8 Center left axillary lymph node, T2 N1 stage IIB  Treatment plan based on multidisciplinary tumor board: 1. Neoadjuvant chemotherapy with Adriamycin and Cytoxan dose dense 4 followed by Taxolweekly 12 2. Followed by breast conserving surgery with sentinel lymph node study vs targeted axillary dissection 3. Followed by adjuvant radiation therapy --------------------------------------------------------------------------------------------------------------------------------------------- Current treatment: Cycle 2 day 1 dose dense Adriamycin Cytoxan Echocardiogram 04/09/2016: EF 55-60% Closely monitoring for chemotherapy toxicities.  Chemotherapy toxicities:  Renal issues with the left hydronephrosis and chronic ureteropelvic obstruction: Referred to urology.  Return to clinic in 2 weeks for cycle 3

## 2016-05-01 NOTE — Patient Instructions (Signed)
[facility] Discharge Instructions for Patients Receiving Chemotherapy  Today you received the following chemotherapy agents: Cytoxan, Adriamycin  To help prevent nausea and vomiting after your treatment, we encourage you to take your nausea medication as prescribed.   If you develop nausea and vomiting that is not controlled by your nausea medication, call the clinic.   BELOW ARE SYMPTOMS THAT SHOULD BE REPORTED IMMEDIATELY:  *FEVER GREATER THAN 100.5 F  *CHILLS WITH OR WITHOUT FEVER  NAUSEA AND VOMITING THAT IS NOT CONTROLLED WITH YOUR NAUSEA MEDICATION  *UNUSUAL SHORTNESS OF BREATH  *UNUSUAL BRUISING OR BLEEDING  TENDERNESS IN MOUTH AND THROAT WITH OR WITHOUT PRESENCE OF ULCERS  *URINARY PROBLEMS  *BOWEL PROBLEMS  UNUSUAL RASH Items with * indicate a potential emergency and should be followed up as soon as possible.  Feel free to call the clinic you have any questions or concerns. The clinic phone number is [phone].  Please show the [place] at check-in to the Emergency Department and triage nurse.

## 2016-05-01 NOTE — Progress Notes (Signed)
Patient Care Team: Flossie Buffy, NP as PCP - General (Internal Medicine) Alphonsa Overall, MD as Consulting Physician (General Surgery) Nicholas Lose, MD as Consulting Physician (Hematology and Oncology) Eppie Gibson, MD as Attending Physician (Radiation Oncology)  DIAGNOSIS:  Encounter Diagnosis  Name Primary?  . 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 She is tolerating the chemotherapy well. After the chemotherapy she felt tired for day or 2 but otherwise had been doing quite well. She had menstrual cycle in the midst of all this and had some cramping and discomfort related to that.  REVIEW OF SYSTEMS:   Constitutional: Denies fevers, chills or abnormal weight loss Eyes: Denies blurriness of vision Ears, nose, mouth, throat, and face: Denies mucositis or sore throat Respiratory: Denies cough, dyspnea or  wheezes Cardiovascular: Denies palpitation, chest discomfort Gastrointestinal:  Denies nausea, heartburn or change in bowel habits Skin: Denies abnormal skin rashes Lymphatics: Denies new lymphadenopathy or easy bruising Neurological:Denies numbness, tingling or new weaknesses Behavioral/Psych: Mood is stable, no new changes  Extremities: No lower extremity edema Breast:  denies any pain or lumps or nodules in either breasts All other systems were reviewed with the patient and are negative.  I have reviewed the past medical history, past surgical history, social history and family history with the patient and they are unchanged from previous note.  ALLERGIES:  has No Known Allergies.  MEDICATIONS:  Current Outpatient Prescriptions  Medication Sig Dispense Refill  . HYDROcodone-acetaminophen (NORCO/VICODIN) 5-325 MG tablet Take 1-2 tablets by mouth every 6 (six) hours as needed. (Patient not taking: Reported on 04/24/2016) 20 tablet 0  . lidocaine-prilocaine (EMLA) cream Apply to affected area once 30 g 3  . LORazepam (ATIVAN) 0.5 MG tablet Take 1 tablet (0.5 mg total) by mouth every 6 (six) hours as needed (Nausea or vomiting). 30 tablet 0  . Multiple Vitamins-Minerals (WOMENS MULTI VITAMIN & MINERAL) TABS Take 1 tablet by mouth daily after breakfast. 30 tablet 3  . ondansetron (ZOFRAN) 8 MG tablet Take 1 tablet (8 mg total) by mouth 2 (two) times daily as needed. Start on the third day after chemotherapy. 30 tablet 1  . prochlorperazine (COMPAZINE) 10 MG tablet Take 1 tablet (10 mg total) by mouth every 6 (six) hours as needed (Nausea or vomiting). 30 tablet 1   No current facility-administered medications for this visit.     PHYSICAL EXAMINATION: ECOG PERFORMANCE  STATUS: 1 - Symptomatic but completely ambulatory  Vitals:   05/01/16 1029  BP: 131/61  Pulse: 74  Resp: 18  Temp: 97.9 F (36.6 C)   Filed Weights   05/01/16 1029  Weight: 205 lb 8 oz (93.2 kg)    GENERAL:alert,  no distress and comfortable SKIN: skin color, texture, turgor are normal, no rashes or significant lesions EYES: normal, Conjunctiva are pink and non-injected, sclera clear OROPHARYNX:no exudate, no erythema and lips, buccal mucosa, and tongue normal  NECK: supple, thyroid normal size, non-tender, without nodularity LYMPH:  no palpable lymphadenopathy in the cervical, axillary or inguinal LUNGS: clear to auscultation and percussion with normal breathing effort HEART: regular rate & rhythm and no murmurs and no lower extremity edema ABDOMEN:abdomen soft, non-tender and normal bowel sounds MUSCULOSKELETAL:no cyanosis of digits and no clubbing  NEURO: alert & oriented x 3 with fluent speech, no focal motor/sensory deficits EXTREMITIES: No lower extremity edema  LABORATORY DATA:  I have reviewed the data as listed   Chemistry      Component Value Date/Time   NA 140 05/01/2016 0953   K 3.8 05/01/2016 0953   CL 109 03/05/2016 0959   CO2 24 05/01/2016 0953   BUN 9.1 05/01/2016 0953   CREATININE 0.7 05/01/2016 0953      Component Value Date/Time   CALCIUM 8.7 05/01/2016 0953   ALKPHOS 82 05/01/2016 0953   AST 46 (H) 05/01/2016 0953   ALT 54 05/01/2016 0953   BILITOT 0.64 05/01/2016 0953       Lab Results  Component Value Date   WBC 7.4 05/01/2016   HGB 8.6 (L) 05/01/2016   HCT 28.8 (L) 05/01/2016   MCV 65.2 (L) 05/01/2016   PLT 238 05/01/2016   NEUTROABS 5.1 05/01/2016    ASSESSMENT & PLAN:  Malignant neoplasm of lower-inner quadrant of left breast in female, estrogen receptor negative ([place]) 03/22/2016: Left breast biopsy 8:00: IDC high-grade with necrosis, left axillary lymph node biopsy IDC, ER 0%, PR 0%, HER-2 negative ratio 1.42, Ki-67 90%; palpable lump 3.5 x 2.6 x 2.2 cm and 2.8 Center left axillary lymph node, T2 N1 stage IIB  Treatment plan based on multidisciplinary tumor board: 1. Neoadjuvant chemotherapy with Adriamycin and Cytoxan dose dense 4 followed by  Taxolweekly 12 2. Followed by breast conserving surgery with sentinel lymph node study vs targeted axillary dissection 3. Followed by adjuvant radiation therapy --------------------------------------------------------------------------------------------------------------------------------------------- Current treatment: Cycle 2 day 1 dose dense Adriamycin Cytoxan Echocardiogram 04/09/2016: EF 55-60% Closely monitoring for chemotherapy toxicities.  Chemotherapy toxicities: Denies any major side effects other than fatigue  Microcytic anemia: Status post 2 units of iron infusion We will recheck her eye and studies in 2 weeks and if she is low she will get 2 more doses of Feraheme.  Renal issues with the left hydronephrosis and chronic ureteropelvic obstruction: Referred to urology.  Return to clinic in 2 weeks for cycle 3 and will see Mendel Ryder our nurse practitioner.    I spent 25 minutes talking to the patient of which more than half was spent in counseling and coordination of care.  Orders Placed This Encounter  Procedures  . Iron and TIBC    Standing Status:   Future    Standing Expiration Date:   05/01/2017  . Ferritin    Standing Status:   Future    Standing Expiration Date:   05/01/2017   The patient has a good understanding of the overall plan. she agrees with it. she will call with  any problems that may develop before the next visit here.   Rulon Eisenmenger, MD 05/01/16

## 2016-05-02 ENCOUNTER — Encounter: Payer: Self-pay | Admitting: *Deleted

## 2016-05-02 ENCOUNTER — Other Ambulatory Visit: Payer: Self-pay | Admitting: Urology

## 2016-05-02 DIAGNOSIS — Q6211 Congenital occlusion of ureteropelvic junction: Principal | ICD-10-CM

## 2016-05-02 DIAGNOSIS — Q6239 Other obstructive defects of renal pelvis and ureter: Secondary | ICD-10-CM

## 2016-05-02 NOTE — Progress Notes (Signed)
Faxed work note to [employer] at [phone]

## 2016-05-15 ENCOUNTER — Other Ambulatory Visit (HOSPITAL_BASED_OUTPATIENT_CLINIC_OR_DEPARTMENT_OTHER): Payer: Medicaid Other

## 2016-05-15 ENCOUNTER — Other Ambulatory Visit (HOSPITAL_COMMUNITY)
Admission: RE | Admit: 2016-05-15 | Discharge: 2016-05-15 | Disposition: A | Discharge status: Home / Self Care | Payer: Medicaid Other | Source: Ambulatory Visit | Attending: Hematology and Oncology | Admitting: Hematology and Oncology

## 2016-05-15 ENCOUNTER — Ambulatory Visit (HOSPITAL_BASED_OUTPATIENT_CLINIC_OR_DEPARTMENT_OTHER): Payer: Medicaid Other

## 2016-05-15 ENCOUNTER — Ambulatory Visit (HOSPITAL_BASED_OUTPATIENT_CLINIC_OR_DEPARTMENT_OTHER): Payer: Medicaid Other | Admitting: Genetics

## 2016-05-15 ENCOUNTER — Encounter: Payer: Self-pay | Admitting: Genetics

## 2016-05-15 ENCOUNTER — Ambulatory Visit: Payer: Medicaid Other

## 2016-05-15 ENCOUNTER — Ambulatory Visit (HOSPITAL_BASED_OUTPATIENT_CLINIC_OR_DEPARTMENT_OTHER): Payer: Medicaid Other | Admitting: Adult Health

## 2016-05-15 ENCOUNTER — Encounter: Payer: Self-pay | Admitting: Adult Health

## 2016-05-15 VITALS — BP 126/72 | HR 65 | Temp 97.9°F | Resp 18 | Ht 66.5 in | Wt 205.8 lb

## 2016-05-15 DIAGNOSIS — Z171 Estrogen receptor negative status [ER-]: Secondary | ICD-10-CM

## 2016-05-15 DIAGNOSIS — Z803 Family history of malignant neoplasm of breast: Secondary | ICD-10-CM

## 2016-05-15 DIAGNOSIS — Z5111 Encounter for antineoplastic chemotherapy: Secondary | ICD-10-CM | POA: Diagnosis not present

## 2016-05-15 DIAGNOSIS — C50312 Malignant neoplasm of lower-inner quadrant of left female breast: Secondary | ICD-10-CM

## 2016-05-15 DIAGNOSIS — N130 Hydronephrosis with ureteropelvic junction obstruction: Secondary | ICD-10-CM

## 2016-05-15 DIAGNOSIS — D649 Anemia, unspecified: Secondary | ICD-10-CM

## 2016-05-15 DIAGNOSIS — C773 Secondary and unspecified malignant neoplasm of axilla and upper limb lymph nodes: Secondary | ICD-10-CM

## 2016-05-15 DIAGNOSIS — C50912 Malignant neoplasm of unspecified site of left female breast: Secondary | ICD-10-CM

## 2016-05-15 LAB — CBC WITH DIFFERENTIAL/PLATELET
BASO%: 0.4 % (ref 0.0–2.0)
Basophils Absolute: 0 10*3/uL (ref 0.0–0.1)
EOS ABS: 0 10*3/uL (ref 0.0–0.5)
EOS%: 0.3 % (ref 0.0–7.0)
HCT: 32.9 % — ABNORMAL LOW (ref 34.8–46.6)
HGB: 10.2 g/dL — ABNORMAL LOW (ref 11.6–15.9)
LYMPH%: 15.3 % (ref 14.0–49.7)
MCH: 21.5 pg — ABNORMAL LOW (ref 25.1–34.0)
MCHC: 31 g/dL — AB (ref 31.5–36.0)
MCV: 69.4 fL — AB (ref 79.5–101.0)
MONO#: 1 10*3/uL — ABNORMAL HIGH (ref 0.1–0.9)
MONO%: 10.6 % (ref 0.0–14.0)
NEUT%: 73.4 % (ref 38.4–76.8)
NEUTROS ABS: 6.6 10*3/uL — AB (ref 1.5–6.5)
NRBC: 0 % (ref 0–0)
PLATELETS: 151 10*3/uL (ref 145–400)
RBC: 4.74 10*6/uL (ref 3.70–5.45)
RDW: 31.9 % — ABNORMAL HIGH (ref 11.2–14.5)
WBC: 8.9 10*3/uL (ref 3.9–10.3)
lymph#: 1.4 10*3/uL (ref 0.9–3.3)

## 2016-05-15 LAB — FERRITIN: Ferritin: 289 ng/ml — ABNORMAL HIGH (ref 9–269)

## 2016-05-15 LAB — COMPREHENSIVE METABOLIC PANEL
ALK PHOS: 75 U/L (ref 38–126)
ALT: 29 U/L (ref 14–54)
ANION GAP: 6 (ref 5–15)
AST: 30 U/L (ref 15–41)
Albumin: 3.5 g/dL (ref 3.5–5.0)
BILIRUBIN TOTAL: 0.4 mg/dL (ref 0.3–1.2)
BUN: 11 mg/dL (ref 6–20)
CALCIUM: 8.8 mg/dL — AB (ref 8.9–10.3)
CO2: 23 mmol/L (ref 22–32)
Chloride: 108 mmol/L (ref 101–111)
Creatinine, Ser: 0.65 mg/dL (ref 0.44–1.00)
GFR calc Af Amer: >60 mL/min (ref >60)
GLUCOSE: 82 mg/dL (ref 65–99)
POTASSIUM: 3.9 mmol/L (ref 3.5–5.1)
Sodium: 137 mmol/L (ref 135–145)
TOTAL PROTEIN: 6.8 g/dL (ref 6.5–8.1)

## 2016-05-15 LAB — IRON AND TIBC
%SAT: 43 % (ref 21–57)
IRON: 85 ug/dL (ref 41–142)
TIBC: 196 ug/dL — ABNORMAL LOW (ref 236–444)
UIBC: 112 ug/dL — AB (ref 120–384)

## 2016-05-15 MED ORDER — PALONOSETRON HCL INJECTION 0.25 MG/5ML
INTRAVENOUS | Status: AC
  Filled 2016-05-15: qty 5

## 2016-05-15 MED ORDER — HEPARIN SOD (PORK) LOCK FLUSH 100 UNIT/ML IV SOLN
500.0000 [IU] | Freq: Once | INTRAVENOUS | Status: AC | PRN
  Administered 2016-05-15: 500 [IU]
  Filled 2016-05-15: qty 5

## 2016-05-15 MED ORDER — DOXORUBICIN HCL CHEMO IV INJECTION 2 MG/ML
60.0000 mg/m2 | Freq: Once | INTRAVENOUS | Status: AC
  Administered 2016-05-15: 126 mg via INTRAVENOUS
  Filled 2016-05-15: qty 63

## 2016-05-15 MED ORDER — PEGFILGRASTIM 6 MG/0.6ML ~~LOC~~ PSKT
6.0000 mg | PREFILLED_SYRINGE | Freq: Once | SUBCUTANEOUS | Status: AC
  Administered 2016-05-15: 6 mg via SUBCUTANEOUS
  Filled 2016-05-15: qty 0.6

## 2016-05-15 MED ORDER — PALONOSETRON HCL INJECTION 0.25 MG/5ML
0.2500 mg | Freq: Once | INTRAVENOUS | Status: AC
  Administered 2016-05-15: 0.25 mg via INTRAVENOUS

## 2016-05-15 MED ORDER — SODIUM CHLORIDE 0.9 % IV SOLN
600.0000 mg/m2 | Freq: Once | INTRAVENOUS | Status: AC
  Administered 2016-05-15: 1260 mg via INTRAVENOUS
  Filled 2016-05-15: qty 63

## 2016-05-15 MED ORDER — SODIUM CHLORIDE 0.9% FLUSH
10.0000 mL | INTRAVENOUS | Status: DC | PRN
  Administered 2016-05-15: 10 mL
  Filled 2016-05-15: qty 10

## 2016-05-15 MED ORDER — SODIUM CHLORIDE 0.9 % IV SOLN
Freq: Once | INTRAVENOUS | Status: AC
  Administered 2016-05-15: 13:00:00 via INTRAVENOUS

## 2016-05-15 MED ORDER — SODIUM CHLORIDE 0.9 % IV SOLN
Freq: Once | INTRAVENOUS | Status: AC
  Administered 2016-05-15: 13:00:00 via INTRAVENOUS
  Filled 2016-05-15: qty 5

## 2016-05-15 NOTE — Patient Instructions (Signed)
[facility] Discharge Instructions for Patients Receiving Chemotherapy  Today you received the following chemotherapy agents: Cytoxan, Adriamycin  To help prevent nausea and vomiting after your treatment, we encourage you to take your nausea medication as prescribed.   If you develop nausea and vomiting that is not controlled by your nausea medication, call the clinic.   BELOW ARE SYMPTOMS THAT SHOULD BE REPORTED IMMEDIATELY:  *FEVER GREATER THAN 100.5 F  *CHILLS WITH OR WITHOUT FEVER  NAUSEA AND VOMITING THAT IS NOT CONTROLLED WITH YOUR NAUSEA MEDICATION  *UNUSUAL SHORTNESS OF BREATH  *UNUSUAL BRUISING OR BLEEDING  TENDERNESS IN MOUTH AND THROAT WITH OR WITHOUT PRESENCE OF ULCERS  *URINARY PROBLEMS  *BOWEL PROBLEMS  UNUSUAL RASH Items with * indicate a potential emergency and should be followed up as soon as possible.  Feel free to call the clinic you have any questions or concerns. The clinic phone number is [phone].  Please show the [place] at check-in to the Emergency Department and triage nurse.

## 2016-05-15 NOTE — Patient Instructions (Signed)
Implanted Port Home Guide An implanted port is a type of central line that is placed under the skin. Central lines are used to provide IV access when treatment or nutrition needs to be given through a person's veins. Implanted ports are used for long-term IV access. An implanted port may be placed because:  You need IV medicine that would be irritating to the small veins in your hands or arms.  You need long-term IV medicines, such as antibiotics.  You need IV nutrition for a long period.  You need frequent blood draws for lab tests.  You need dialysis.  Implanted ports are usually placed in the chest area, but they can also be placed in the upper arm, the abdomen, or the leg. An implanted port has two main parts:  Reservoir. The reservoir is round and will appear as a small, raised area under your skin. The reservoir is the part where a needle is inserted to give medicines or draw blood.  Catheter. The catheter is a thin, flexible tube that extends from the reservoir. The catheter is placed into a large vein. Medicine that is inserted into the reservoir goes into the catheter and then into the vein.  How will I care for my incision site? Do not get the incision site wet. Bathe or shower as directed by your health care provider. How is my port accessed? Special steps must be taken to access the port:  Before the port is accessed, a numbing cream can be placed on the skin. This helps numb the skin over the port site.  Your health care provider uses a sterile technique to access the port. ? Your health care provider must put on a mask and sterile gloves. ? The skin over your port is cleaned carefully with an antiseptic and allowed to dry. ? The port is gently pinched between sterile gloves, and a needle is inserted into the port.  Only "non-coring" port needles should be used to access the port. Once the port is accessed, a blood return should be checked. This helps ensure that the port  is in the vein and is not clogged.  If your port needs to remain accessed for a constant infusion, a clear (transparent) bandage will be placed over the needle site. The bandage and needle will need to be changed every week, or as directed by your health care provider.  Keep the bandage covering the needle clean and dry. Do not get it wet. Follow your health care provider's instructions on how to take a shower or bath while the port is accessed.  If your port does not need to stay accessed, no bandage is needed over the port.  What is flushing? Flushing helps keep the port from getting clogged. Follow your health care provider's instructions on how and when to flush the port. Ports are usually flushed with saline solution or a medicine called heparin. The need for flushing will depend on how the port is used.  If the port is used for intermittent medicines or blood draws, the port will need to be flushed: ? After medicines have been given. ? After blood has been drawn. ? As part of routine maintenance.  If a constant infusion is running, the port may not need to be flushed.  How long will my port stay implanted? The port can stay in for as long as your health care provider thinks it is needed. When it is time for the port to come out, surgery will be   done to remove it. The procedure is similar to the one performed when the port was put in. When should I seek immediate medical care? When you have an implanted port, you should seek immediate medical care if:  You notice a bad smell coming from the incision site.  You have swelling, redness, or drainage at the incision site.  You have more swelling or pain at the port site or the surrounding area.  You have a fever that is not controlled with medicine.  This information is not intended to replace advice given to you by your health care provider. Make sure you discuss any questions you have with your health care provider. Document  Released: 01/21/2005 Document Revised: 06/29/2015 Document Reviewed: 09/28/2012 Elsevier Interactive Patient Education  2017 [employer]vier Inc.  

## 2016-05-15 NOTE — Progress Notes (Signed)
REFERRING PROVIDER: Nicholas Lose, MD [place], [place] [address]  PRIMARY PROVIDER:  Wilfred Lacy, NP  PRIMARY REASON FOR VISIT:  1. Malignant neoplasm of left breast in female, estrogen receptor negative, unspecified site of breast ([place])     HISTORY OF PRESENT ILLNESS:   Susan Morrison, a 46 y.o. female, was seen for a [place] cancer genetics consultation at the request of Dr. Lindi Adie due to a personal and family history of cancer.  Susan Morrison presents to clinic today to discuss the possibility of a hereditary predisposition to cancer, genetic testing, and to further clarify her future cancer risks, as well as potential cancer risks for family members. She is accompanied to her appointment by her husband.  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 She is currently undergoing neoadjuvant chemotherapy treatments.  CANCER HISTORY:    Malignant neoplasm of lower-inner quadrant of left breast in female, estrogen receptor negative ([place])   03/22/2016 Initial Diagnosis    Left breast biopsy 8:00: IDC high-grade with necrosis, left axillary lymph node biopsy IDC, ER 0%, PR 0%, HER-2 negative ratio 1.42, Ki-67 90%; palpable lump 3.5 x 2.6 x 2.2 cm and 2.8 Center left axillary lymph node, T2 N1 stage IIB      04/10/2016 Initial Biopsy    Additional biopsies left breast 9:30: Sclerosed intraductal papilloma with every 3 hours, right breast 1:00: Complex sclerosing lesion with UDH, right breast 8:00: Complex sclerosing lesion with [facility]      04/17/2016 -  Neo-Adjuvant Chemotherapy    Dose dense Adriamycin and Cytoxan 4 followed by Taxol weekly 12        HORMONAL RISK FACTORS:  Menarche was at age 3.  First live birth at age 17.  OCP use for approximately 0 years.  Ovaries intact: yes.  Hysterectomy: no.  Menopausal status: premenopausal.  HRT use: 0 years. Colonoscopy: no; not  examined. Mammogram within the last year: yes. Number of breast biopsies: 6. Up to date with pelvic exams:  yes. Any excessive radiation exposure in the past:  no  Past Medical History:  Diagnosis Date  . Anemia   . Breast mass 03/21/2016   left 7:00  . Cancer [facility])    left brest cancer     Past Surgical History:  Procedure Laterality Date  . DILATION AND CURETTAGE OF UTERUS     last done 2years ago  . PORTACATH PLACEMENT N/A 04/15/2016   Procedure: INSERTION PORT-A-CATH WITH [place];  Surgeon: Alphonsa Overall, MD;  Location: [facility] ORS;  Service: General;  Laterality: N/A;  . [place] History   Social History  . Marital status: Single    Spouse name: N/A  . Number of children: N/A  . Years of education: N/A   Social History Main Topics  . Smoking status: Never Smoker  . Smokeless tobacco: Never Used  . Alcohol use Yes     Comment: wine/social  . Drug use: No  . Sexual activity: Yes    Birth control/ protection: Surgical   Other Topics Concern  . Not on file   Social History Narrative  . No narrative on file     FAMILY HISTORY:  We obtained a detailed, 4-generation family history.  Significant diagnoses are listed below: Family History  Problem Relation Age of Onset  . Hypertension Mother   . Diabetes Mother   . Breast cancer  Paternal Grandmother 72    d.62 bilateral breast cancer   Susan Morrison has two daughters and a son (ages 63, 58, and 29) who are all without cancers. She has no full-siblings. She has two maternal half-brothers (ages 34 and 62) and a maternal half-sister (age 50). Her siblings are all without cancers.  Susan Morrison father died around the age of 21 from a gunshot wound. Her father has two sisters (ages 31 and 68) without cancers. Susan Morrison paternal grandmother died at age 86 from bilateral breast cancer first diagnosed at age 82. This grandmother had a sister who died of lung cancer. Susan Morrison paternal grandfather died at 18  without cancers.  Susan Morrison mother is 52 without cancers. Her mother has one full-brother and a maternal half-brother and maternal half-sister. There are no known cancers in Susan Morrison's maternal relatives. Her maternal grandparents both died in their 64s without cancers.  Susan Morrison is unaware of previous family history of genetic testing for hereditary cancer risks. Patient's maternal and paternal ancestors are of African American descent. There is no reported Ashkenazi Jewish ancestry. There is no known consanguinity.  GENETIC COUNSELING ASSESSMENT: Susan Morrison is a 46 y.o. female with a personal and family history which is somewhat suggestive of a hereditary cancer syndromes and predisposition to cancer. We, therefore, discussed and recommended the following at today's visit.   DISCUSSION: We reviewed the characteristics, features and inheritance patterns of hereditary cancer syndromes. We also discussed genetic testing, including the appropriate family members to test, the process of testing, insurance coverage and turn-around-time for results. We discussed the implications of a negative, positive and/or variant of uncertain significant result. We recommended Susan Morrison pursue genetic testing for the 46-gene Common Hereditary Cancer Panel offered by [facility].   Based on Susan Morrison personal and family history of cancer, she meets medical criteria for genetic testing. I do not anticipate out-of-pocket expense for Susan Morrison for her genetic testing. However, if her out of pocket cost for testing is over $100, the laboratory will call and confirm whether she wants to proceed with testing.  If the out of pocket cost of testing is less than $100 she will be billed by the genetic testing laboratory.   PLAN: After considering the risks, benefits, and limitations, Susan Morrison  provided informed consent to pursue genetic testing and the blood sample was sent to [facility] for analysis of the  46-gene Common Hereditary Cancers Panel. Results should be available within approximately 3 weeks' time, at which point they will be disclosed by telephone to Susan Morrison, as will any additional recommendations warranted by these results. Susan Morrison will receive a summary of her genetic counseling visit and a copy of her results once available. This information will also be available in Epic.   Lastly, we encouraged Susan Morrison to remain in contact with cancer genetics annually so that we can continuously update the family history and inform her of any changes in cancer genetics and testing that may be of benefit for this family.   Susan Morrison.  Morrison questions were answered to her satisfaction today. Our contact information was provided should additional questions or concerns arise. Thank you for the referral and allowing [place] to share in the care of your patient.   Mal Misty, Susan Morrison, [facility] Certified Naval [email or web address] phone: [phone]  The patient was seen for a total of 30 minutes in face-to-face genetic counseling.  This patient was discussed with Drs. Magrinat, Lindi Adie and/or Burr Medico who  agrees with the above.  _______________________________________________________________________ For Office Staff:  Number of people involved in session: 2 Was an Intern/ student involved with case: no

## 2016-05-15 NOTE — Progress Notes (Signed)
Patient Care Team: Flossie Buffy, NP as PCP - General (Internal Medicine) Alphonsa Overall, MD as Consulting Physician (General Surgery) Nicholas Lose, MD as Consulting Physician (Hematology and Oncology) Eppie Gibson, MD as Attending Physician (Radiation Oncology)  DIAGNOSIS:  Encounter Diagnosis  Name Primary?  . Malignant neoplasm of lower-inner quadrant of left breast in female, estrogen receptor negative ([place]) Yes    SUMMARY OF ONCOLOGIC HISTORY:   Malignant neoplasm of lower-inner quadrant of left breast in female, estrogen receptor negative ([place])   03/22/2016 Initial Diagnosis    Left breast biopsy 8:00: IDC high-grade with necrosis, left axillary lymph node biopsy IDC, ER 0%, PR 0%, HER-2 negative ratio 1.42, Ki-67 90%; palpable lump 3.5 x 2.6 x 2.2 cm and 2.8 Center left axillary lymph node, T2 N1 stage IIB      04/10/2016 Initial Biopsy    Additional biopsies left breast 9:30: Sclerosed intraductal papilloma with every 3 hours, right breast 1:00: Complex sclerosing lesion with UDH, right breast 8:00: Complex sclerosing lesion with [facility]      04/17/2016 -  Neo-Adjuvant Chemotherapy    Dose dense Adriamycin and Cytoxan 4 followed by Taxol weekly 12       CHIEF COMPLIANT: Cycle 3 dose dense Adriamycin Cytoxan  INTERVAL HISTORY: Susan Morrison is a 46 year old with above-mentioned history left breast cancer who is undergoing neoadjuvant chemotherapy and today is cycle 3 of 4 of AC.  She has tolerated the first two cycles well.  She denies any difficulties.  She does have occasional intermittent tingling in her left upper arm.  She denies peripheral neuropathy.  Her hemoglobin is improved tremendously.  It is 10.4 today.  Her husband has questions on Marinol and whether or not it could help her.    REVIEW OF SYSTEMS:   Review of Systems  Constitutional: Positive for malaise/fatigue. Negative for chills, fever and weight loss.  HENT: Negative for hearing loss and tinnitus.     Eyes: Negative for blurred vision and double vision.  Respiratory: Negative for cough and shortness of breath.   Cardiovascular: Negative for chest pain, palpitations and leg swelling.  Gastrointestinal: Negative for abdominal pain, blood in stool, constipation, diarrhea, heartburn, melena, nausea and vomiting.  Genitourinary: Negative for dysuria.  Neurological: Negative for dizziness, tingling, sensory change, focal weakness, weakness and headaches.  Endo/Heme/Allergies: Negative for environmental allergies. Does not bruise/bleed easily.     I have reviewed the past medical history, past surgical history, social history and family history with the patient and they are unchanged from previous note.  ALLERGIES:  has No Known Allergies.  MEDICATIONS:  Current Outpatient Prescriptions  Medication Sig Dispense Refill  . HYDROcodone-acetaminophen (NORCO/VICODIN) 5-325 MG tablet Take 1-2 tablets by mouth every 6 (six) hours as needed. (Patient not taking: Reported on 04/24/2016) 20 tablet 0  . lidocaine-prilocaine (EMLA) cream Apply to affected area once 30 g 3  . LORazepam (ATIVAN) 0.5 MG tablet Take 1 tablet (0.5 mg total) by mouth every 6 (six) hours as needed (Nausea or vomiting). 30 tablet 0  . Multiple Vitamins-Minerals (WOMENS MULTI VITAMIN & MINERAL) TABS Take 1 tablet by mouth daily after breakfast. 30 tablet 3  . ondansetron (ZOFRAN) 8 MG tablet Take 1 tablet (8 mg total) by mouth 2 (two) times daily as needed. Start on the third day after chemotherapy. 30 tablet 1  . prochlorperazine (COMPAZINE) 10 MG tablet Take 1 tablet (10 mg total) by mouth every 6 (six) hours as needed (Nausea or vomiting). 30 tablet  1   No current facility-administered medications for this visit.     PHYSICAL EXAMINATION: ECOG PERFORMANCE STATUS: 1 - Symptomatic but completely ambulatory  Vitals:   05/15/16 1025  BP: 126/72  Pulse: 65  Resp: 18  Temp: 97.9 F (36.6 C)   Filed Weights   05/15/16  1025  Weight: 205 lb 12.8 oz (93.4 kg)  GENERAL: Patient is a well appearing female in no acute distress HEENT:  Sclerae anicteric.  PERRL.  Oropharynx clear and moist. No ulcerations or evidence of oropharyngeal candidiasis. Neck is supple.  NODES:  No cervical, supraclavicular, infraclavicular, or axillary lymphadenopathy palpated.  BREAST EXAM:  Deferred. LUNGS:  Clear to auscultation bilaterally.  No wheezes or rhonchi. HEART:  Regular rate and rhythm. No murmur appreciated. ABDOMEN:  Soft, nontender.  Positive, normoactive bowel sounds. No organomegaly palpated. MSK:  No focal spinal tenderness to palpation. Full range of motion bilaterally in the upper extremities. EXTREMITIES:  No peripheral edema.   SKIN:  Clear with no obvious rashes or skin changes. No nail dyscrasia. NEURO:  Nonfocal. Well oriented.  Appropriate affect.    LABORATORY DATA:  I have reviewed the data as listed   Chemistry      Component Value Date/Time   NA 140 05/01/2016 0953   K 3.8 05/01/2016 0953   CL 109 03/05/2016 0959   CO2 24 05/01/2016 0953   BUN 9.1 05/01/2016 0953   CREATININE 0.7 05/01/2016 0953      Component Value Date/Time   CALCIUM 8.7 05/01/2016 0953   ALKPHOS 82 05/01/2016 0953   AST 46 (H) 05/01/2016 0953   ALT 54 05/01/2016 0953   BILITOT 0.64 05/01/2016 0953       Lab Results  Component Value Date   WBC 8.9 05/15/2016   HGB 10.2 (L) 05/15/2016   HCT 32.9 (L) 05/15/2016   MCV 69.4 (L) 05/15/2016   PLT 151 05/15/2016   NEUTROABS 6.6 (H) 05/15/2016    ASSESSMENT & PLAN:  Malignant neoplasm of lower-inner quadrant of left breast in female, estrogen receptor negative ([place]) 03/22/2016: Left breast biopsy 8:00: IDC high-grade with necrosis, left axillary lymph node biopsy IDC, ER 0%, PR 0%, HER-2 negative ratio 1.42, Ki-67 90%; palpable lump 3.5 x 2.6 x 2.2 cm and 2.8 Center left axillary lymph node, T2 N1 stage IIB  Treatment plan based on multidisciplinary tumor  board: 1. Neoadjuvant chemotherapy with Adriamycin and Cytoxan dose dense 4 followed by Taxolweekly 12 2. Followed by breast conserving surgery with sentinel lymph node study vs targeted axillary dissection 3. Followed by adjuvant radiation therapy --------------------------------------------------------------------------------------------------------------------------------------------- Current treatment: Cycle 3 day 1 dose dense Adriamycin Cytoxan Echocardiogram 04/09/2016: EF 55-60% Closely monitoring for chemotherapy toxicities.  Chemotherapy toxicities: Denies any major side effects other than fatigue  Microcytic anemia: Status post 2 units of iron infusion Hemoglobin currently 10.4  Renal issues with the left hydronephrosis and chronic ureteropelvic obstruction: Referred to urology.  Susan Morrison is doing well today.  She will proceed with treatment.  I reviewed her CBC with her today which is stable, with the exception of the marked improvement in her hemoglobin.  I reviewed indication for marinol with patient and her husband and that she didn't currently meet criteria for marinol.    Susan Morrison will return in 2 weeks for labs, evaluation, and cycle 4 of AC.  I spent 25 minutes talking to the patient of which more than half was spent in counseling and coordination of care.   The patient has a good  understanding of the overall plan. she agrees with it. she will call with any problems that may develop before the next visit here.   Scot Dock, NP 05/15/16

## 2016-05-16 ENCOUNTER — Encounter (HOSPITAL_COMMUNITY)
Admission: RE | Admit: 2016-05-16 | Discharge: 2016-05-16 | Disposition: A | Discharge status: Home / Self Care | Payer: Medicaid Other | Source: Ambulatory Visit | Attending: Urology | Admitting: Urology

## 2016-05-16 DIAGNOSIS — Q6211 Congenital occlusion of ureteropelvic junction: Secondary | ICD-10-CM | POA: Diagnosis present

## 2016-05-16 DIAGNOSIS — Q6239 Other obstructive defects of renal pelvis and ureter: Secondary | ICD-10-CM

## 2016-05-16 MED ORDER — FUROSEMIDE 10 MG/ML IJ SOLN
INTRAMUSCULAR | Status: AC
  Filled 2016-05-16: qty 8

## 2016-05-16 MED ORDER — FUROSEMIDE 10 MG/ML IJ SOLN
46.5000 mg | Freq: Once | INTRAMUSCULAR | Status: AC
  Administered 2016-05-16: 47 mg via INTRAVENOUS

## 2016-05-16 MED ORDER — TECHNETIUM TC 99M MERTIATIDE
5.4800 | Freq: Once | INTRAVENOUS | Status: AC | PRN
  Administered 2016-05-16: 5.48 via INTRAVENOUS

## 2016-05-16 MED FILL — ONDANSETRON HCL 8 MG TABLET: 8 | 15 days supply | Qty: 30 | Fill #1

## 2016-05-16 MED FILL — PROCHLORPERAZINE 10 MG TAB: 10 | 7 days supply | Qty: 30 | Fill #1

## 2016-05-22 ENCOUNTER — Encounter: Payer: Self-pay | Admitting: Nurse Practitioner

## 2016-05-22 DIAGNOSIS — Q6239 Other obstructive defects of renal pelvis and ureter: Secondary | ICD-10-CM | POA: Insufficient documentation

## 2016-05-22 DIAGNOSIS — Q6211 Congenital occlusion of ureteropelvic junction: Secondary | ICD-10-CM

## 2016-05-23 ENCOUNTER — Telehealth: Payer: Self-pay | Admitting: Emergency Medicine

## 2016-05-23 NOTE — Telephone Encounter (Signed)
Received voicemail from patient complaining of vaginal bleeding; spoke with Wilber Bihari NP in regards to this complaint; according to her this complaint was handed by Iris Pert RN navigator; no note found in chart; will comfirm with [facility] on 4/20.

## 2016-05-29 ENCOUNTER — Other Ambulatory Visit (HOSPITAL_BASED_OUTPATIENT_CLINIC_OR_DEPARTMENT_OTHER): Payer: Medicaid Other

## 2016-05-29 ENCOUNTER — Ambulatory Visit: Payer: Medicaid Other

## 2016-05-29 ENCOUNTER — Encounter: Payer: Self-pay | Admitting: Adult Health

## 2016-05-29 ENCOUNTER — Ambulatory Visit (HOSPITAL_BASED_OUTPATIENT_CLINIC_OR_DEPARTMENT_OTHER): Payer: Medicaid Other

## 2016-05-29 ENCOUNTER — Encounter: Payer: Medicaid Other | Admitting: Genetic Counselor

## 2016-05-29 ENCOUNTER — Ambulatory Visit (HOSPITAL_BASED_OUTPATIENT_CLINIC_OR_DEPARTMENT_OTHER): Payer: Medicaid Other | Admitting: Adult Health

## 2016-05-29 ENCOUNTER — Telehealth: Payer: Self-pay | Admitting: Hematology and Oncology

## 2016-05-29 VITALS — BP 136/75 | HR 72 | Temp 97.9°F | Resp 20 | Ht 66.5 in | Wt 206.0 lb

## 2016-05-29 DIAGNOSIS — Z171 Estrogen receptor negative status [ER-]: Secondary | ICD-10-CM

## 2016-05-29 DIAGNOSIS — Z5111 Encounter for antineoplastic chemotherapy: Secondary | ICD-10-CM

## 2016-05-29 DIAGNOSIS — D509 Iron deficiency anemia, unspecified: Secondary | ICD-10-CM

## 2016-05-29 DIAGNOSIS — Z5189 Encounter for other specified aftercare: Secondary | ICD-10-CM

## 2016-05-29 DIAGNOSIS — C773 Secondary and unspecified malignant neoplasm of axilla and upper limb lymph nodes: Secondary | ICD-10-CM

## 2016-05-29 DIAGNOSIS — C50312 Malignant neoplasm of lower-inner quadrant of left female breast: Secondary | ICD-10-CM

## 2016-05-29 DIAGNOSIS — N1330 Unspecified hydronephrosis: Secondary | ICD-10-CM | POA: Diagnosis not present

## 2016-05-29 DIAGNOSIS — D500 Iron deficiency anemia secondary to blood loss (chronic): Secondary | ICD-10-CM

## 2016-05-29 LAB — IRON AND TIBC
%SAT: 16 % — ABNORMAL LOW (ref 21–57)
Iron: 32 ug/dL — ABNORMAL LOW (ref 41–142)
TIBC: 201 ug/dL — ABNORMAL LOW (ref 236–444)
UIBC: 169 ug/dL (ref 120–384)

## 2016-05-29 LAB — COMPREHENSIVE METABOLIC PANEL
ALT: 27 U/L (ref 0–55)
AST: 24 U/L (ref 5–34)
Albumin: 3.3 g/dL — ABNORMAL LOW (ref 3.5–5.0)
Alkaline Phosphatase: 89 U/L (ref 40–150)
Anion Gap: 8 mEq/L (ref 3–11)
BILIRUBIN TOTAL: 0.52 mg/dL (ref 0.20–1.20)
BUN: 8.2 mg/dL (ref 7.0–26.0)
CO2: 22 meq/L (ref 22–29)
CREATININE: 0.7 mg/dL (ref 0.6–1.1)
Calcium: 8.6 mg/dL (ref 8.4–10.4)
Chloride: 108 mEq/L (ref 98–109)
EGFR: >90 mL/min/{1.73_m2} (ref >90)
GLUCOSE: 88 mg/dL (ref 70–140)
Potassium: 3.6 mEq/L (ref 3.5–5.1)
Sodium: 139 mEq/L (ref 136–145)
TOTAL PROTEIN: 6.3 g/dL — AB (ref 6.4–8.3)

## 2016-05-29 LAB — CBC WITH DIFFERENTIAL/PLATELET
BASO%: 0.5 % (ref 0.0–2.0)
Basophils Absolute: 0.1 10*3/uL (ref 0.0–0.1)
EOS ABS: 0.1 10*3/uL (ref 0.0–0.5)
EOS%: 0.7 % (ref 0.0–7.0)
HCT: 27.2 % — ABNORMAL LOW (ref 34.8–46.6)
HGB: 8.6 g/dL — ABNORMAL LOW (ref 11.6–15.9)
LYMPH%: 8.9 % — ABNORMAL LOW (ref 14.0–49.7)
MCH: 23.4 pg — ABNORMAL LOW (ref 25.1–34.0)
MCHC: 31.6 g/dL (ref 31.5–36.0)
MCV: 73.9 fL — ABNORMAL LOW (ref 79.5–101.0)
MONO#: 1.3 10*3/uL — ABNORMAL HIGH (ref 0.1–0.9)
MONO%: 12.1 % (ref 0.0–14.0)
NEUT%: 77.8 % — ABNORMAL HIGH (ref 38.4–76.8)
NEUTROS ABS: 8.2 10*3/uL — AB (ref 1.5–6.5)
NRBC: 3 % — AB (ref 0–0)
Platelets: 185 10*3/uL (ref 145–400)
RBC: 3.68 10*6/uL — AB (ref 3.70–5.45)
RDW: 31.7 % — AB (ref 11.2–14.5)
WBC: 10.6 10*3/uL — AB (ref 3.9–10.3)
lymph#: 0.9 10*3/uL (ref 0.9–3.3)

## 2016-05-29 LAB — FERRITIN: Ferritin: 236 ng/ml (ref 9–269)

## 2016-05-29 MED ORDER — HEPARIN SOD (PORK) LOCK FLUSH 100 UNIT/ML IV SOLN
250.0000 [IU] | Freq: Once | INTRAVENOUS | Status: DC | PRN
  Filled 2016-05-29: qty 5

## 2016-05-29 MED ORDER — SODIUM CHLORIDE 0.9 % IV SOLN
Freq: Once | INTRAVENOUS | Status: AC
  Administered 2016-05-29: 13:00:00 via INTRAVENOUS

## 2016-05-29 MED ORDER — PALONOSETRON HCL INJECTION 0.25 MG/5ML
0.2500 mg | Freq: Once | INTRAVENOUS | Status: AC
  Administered 2016-05-29: 0.25 mg via INTRAVENOUS

## 2016-05-29 MED ORDER — GOSERELIN ACETATE 3.6 MG ~~LOC~~ IMPL
3.6000 mg | DRUG_IMPLANT | Freq: Once | SUBCUTANEOUS | Status: AC
  Administered 2016-05-29: 3.6 mg via SUBCUTANEOUS
  Filled 2016-05-29: qty 3.6

## 2016-05-29 MED ORDER — PEGFILGRASTIM 6 MG/0.6ML ~~LOC~~ PSKT
6.0000 mg | PREFILLED_SYRINGE | Freq: Once | SUBCUTANEOUS | Status: AC
  Administered 2016-05-29: 6 mg via SUBCUTANEOUS
  Filled 2016-05-29: qty 0.6

## 2016-05-29 MED ORDER — SODIUM CHLORIDE 0.9% FLUSH
10.0000 mL | INTRAVENOUS | Status: DC | PRN
  Filled 2016-05-29: qty 10

## 2016-05-29 MED ORDER — SODIUM CHLORIDE 0.9 % IV SOLN
600.0000 mg/m2 | Freq: Once | INTRAVENOUS | Status: AC
  Administered 2016-05-29: 1260 mg via INTRAVENOUS
  Filled 2016-05-29: qty 63

## 2016-05-29 MED ORDER — DOXORUBICIN HCL CHEMO IV INJECTION 2 MG/ML
60.0000 mg/m2 | Freq: Once | INTRAVENOUS | Status: AC
  Administered 2016-05-29: 126 mg via INTRAVENOUS
  Filled 2016-05-29: qty 63

## 2016-05-29 MED ORDER — HEPARIN SOD (PORK) LOCK FLUSH 100 UNIT/ML IV SOLN
500.0000 [IU] | Freq: Once | INTRAVENOUS | Status: DC | PRN
  Filled 2016-05-29: qty 5

## 2016-05-29 MED ORDER — SODIUM CHLORIDE 0.9 % IV SOLN
Freq: Once | INTRAVENOUS | Status: AC
  Administered 2016-05-29: 13:00:00 via INTRAVENOUS
  Filled 2016-05-29: qty 5

## 2016-05-29 MED ORDER — PALONOSETRON HCL INJECTION 0.25 MG/5ML
INTRAVENOUS | Status: AC
  Filled 2016-05-29: qty 5

## 2016-05-29 NOTE — Patient Instructions (Signed)
Implanted Port Home Guide An implanted port is a type of central line that is placed under the skin. Central lines are used to provide IV access when treatment or nutrition needs to be given through a person's veins. Implanted ports are used for long-term IV access. An implanted port may be placed because:  You need IV medicine that would be irritating to the small veins in your hands or arms.  You need long-term IV medicines, such as antibiotics.  You need IV nutrition for a long period.  You need frequent blood draws for lab tests.  You need dialysis.  Implanted ports are usually placed in the chest area, but they can also be placed in the upper arm, the abdomen, or the leg. An implanted port has two main parts:  Reservoir. The reservoir is round and will appear as a small, raised area under your skin. The reservoir is the part where a needle is inserted to give medicines or draw blood.  Catheter. The catheter is a thin, flexible tube that extends from the reservoir. The catheter is placed into a large vein. Medicine that is inserted into the reservoir goes into the catheter and then into the vein.  How will I care for my incision site? Do not get the incision site wet. Bathe or shower as directed by your health care provider. How is my port accessed? Special steps must be taken to access the port:  Before the port is accessed, a numbing cream can be placed on the skin. This helps numb the skin over the port site.  Your health care provider uses a sterile technique to access the port. ? Your health care provider must put on a mask and sterile gloves. ? The skin over your port is cleaned carefully with an antiseptic and allowed to dry. ? The port is gently pinched between sterile gloves, and a needle is inserted into the port.  Only "non-coring" port needles should be used to access the port. Once the port is accessed, a blood return should be checked. This helps ensure that the port  is in the vein and is not clogged.  If your port needs to remain accessed for a constant infusion, a clear (transparent) bandage will be placed over the needle site. The bandage and needle will need to be changed every week, or as directed by your health care provider.  Keep the bandage covering the needle clean and dry. Do not get it wet. Follow your health care provider's instructions on how to take a shower or bath while the port is accessed.  If your port does not need to stay accessed, no bandage is needed over the port.  What is flushing? Flushing helps keep the port from getting clogged. Follow your health care provider's instructions on how and when to flush the port. Ports are usually flushed with saline solution or a medicine called heparin. The need for flushing will depend on how the port is used.  If the port is used for intermittent medicines or blood draws, the port will need to be flushed: ? After medicines have been given. ? After blood has been drawn. ? As part of routine maintenance.  If a constant infusion is running, the port may not need to be flushed.  How long will my port stay implanted? The port can stay in for as long as your health care provider thinks it is needed. When it is time for the port to come out, surgery will be   done to remove it. The procedure is similar to the one performed when the port was put in. When should I seek immediate medical care? When you have an implanted port, you should seek immediate medical care if:  You notice a bad smell coming from the incision site.  You have swelling, redness, or drainage at the incision site.  You have more swelling or pain at the port site or the surrounding area.  You have a fever that is not controlled with medicine.  This information is not intended to replace advice given to you by your health care provider. Make sure you discuss any questions you have with your health care provider. Document  Released: 01/21/2005 Document Revised: 06/29/2015 Document Reviewed: 09/28/2012 Elsevier Interactive Patient Education  2017 [employer]vier Inc.  

## 2016-05-29 NOTE — Patient Instructions (Signed)
Goserelin injection What is this medicine? GOSERELIN (GOE se rel in) is similar to a hormone found in the body. It lowers the amount of sex hormones that the body makes. Men will have lower testosterone levels and women will have lower estrogen levels while taking this medicine. In men, this medicine is used to treat prostate cancer; the injection is either given once per month or once every 12 weeks. A once per month injection (only) is used to treat women with endometriosis, dysfunctional uterine bleeding, or advanced breast cancer. This medicine may be used for other purposes; ask your health care provider or pharmacist if you have questions. COMMON BRAND NAME(S): Zoladex What should I tell my health care provider before I take this medicine? They need to know if you have any of these conditions (some only apply to women): -diabetes -heart disease or previous heart attack -high blood pressure -high cholesterol -kidney disease -osteoporosis or low bone density -problems passing urine -spinal cord injury -stroke -tobacco smoker -an unusual or allergic reaction to goserelin, hormone therapy, other medicines, foods, dyes, or preservatives -pregnant or trying to get pregnant -breast-feeding How should I use this medicine? This medicine is for injection under the skin. It is given by a health care professional in a hospital or clinic setting. Men receive this injection once every 4 weeks or once every 12 weeks. Women will only receive the once every 4 weeks injection. Talk to your pediatrician regarding the use of this medicine in children. Special care may be needed. Overdosage: If you think you have taken too much of this medicine contact a poison control center or emergency room at once. NOTE: This medicine is only for you. Do not share this medicine with others. What if I miss a dose? It is important not to miss your dose. Call your doctor or health care professional if you are unable to  keep an appointment. What may interact with this medicine? -female hormones like estrogen -herbal or dietary supplements like black cohosh, chasteberry, or DHEA -female hormones like testosterone -prasterone This list may not describe all possible interactions. Give your health care provider a list of all the medicines, herbs, non-prescription drugs, or dietary supplements you use. Also tell them if you smoke, drink alcohol, or use illegal drugs. Some items may interact with your medicine. What should I watch for while using this medicine? Visit your doctor or health care professional for regular checks on your progress. Your symptoms may appear to get worse during the first weeks of this therapy. Tell your doctor or healthcare professional if your symptoms do not start to get better or if they get worse after this time. Your bones may get weaker if you take this medicine for a long time. If you smoke or frequently drink alcohol you may increase your risk of bone loss. A family history of osteoporosis, chronic use of drugs for seizures (convulsions), or corticosteroids can also increase your risk of bone loss. Talk to your doctor about how to keep your bones strong. This medicine should stop regular monthly menstration in women. Tell your doctor if you continue to menstrate. Women should not become pregnant while taking this medicine or for 12 weeks after stopping this medicine. Women should inform their doctor if they wish to become pregnant or think they might be pregnant. There is a potential for serious side effects to an unborn child. Talk to your health care professional or pharmacist for more information. Do not breast-feed an infant while taking   this medicine. Men should inform their doctors if they wish to father a child. This medicine may lower sperm counts. Talk to your health care professional or pharmacist for more information. What side effects may I notice from receiving this  medicine? Side effects that you should report to your doctor or health care professional as soon as possible: -allergic reactions like skin rash, itching or hives, swelling of the face, lips, or tongue -bone pain -breathing problems -changes in vision -chest pain -feeling faint or lightheaded, falls -fever, chills -pain, swelling, warmth in the leg -pain, tingling, numbness in the hands or feet -signs and symptoms of low blood pressure like dizziness; feeling faint or lightheaded, falls; unusually weak or tired -stomach pain -swelling of the ankles, feet, hands -trouble passing urine or change in the amount of urine -unusually high or low blood pressure -unusually weak or tired Side effects that usually do not require medical attention (report to your doctor or health care professional if they continue or are bothersome): -change in sex drive or performance -changes in breast size in both males and females -changes in emotions or moods -headache -hot flashes -irritation at site where injected -loss of appetite -skin problems like acne, dry skin -vaginal dryness This list may not describe all possible side effects. Call your doctor for medical advice about side effects. You may report side effects to FDA at 1-800-FDA-1088. Where should I keep my medicine? This drug is given in a hospital or clinic and will not be stored at home. NOTE: This sheet is a summary. It may not cover all possible information. If you have questions about this medicine, talk to your doctor, pharmacist, or health care provider.  2018 Elsevier/Gold Standard (2013-03-30 11:10:35)  

## 2016-05-29 NOTE — Progress Notes (Signed)
Patient Care Team: Flossie Buffy, NP as PCP - General (Internal Medicine) Alphonsa Overall, MD as Consulting Physician (General Surgery) Nicholas Lose, MD as Consulting Physician (Hematology and Oncology) Eppie Gibson, MD as Attending Physician (Radiation Oncology) Gardenia Phlegm, NP as Nurse Practitioner (Hematology and Oncology)  DIAGNOSIS:  Encounter Diagnosis  Name Primary?  . Malignant neoplasm of lower-inner quadrant of left breast in female, estrogen receptor negative (Susan Morrison) Yes    SUMMARY OF ONCOLOGIC HISTORY:   Malignant neoplasm of lower-inner quadrant of left breast in female, estrogen receptor negative (Susan Morrison)   03/22/2016 Initial Diagnosis    Left breast biopsy 8:00: IDC high-grade with necrosis, left axillary lymph node biopsy IDC, ER 0%, PR 0%, HER-2 negative ratio 1.42, Ki-67 90%; palpable lump 3.5 x 2.6 x 2.2 cm and 2.8 Center left axillary lymph node, T2 N1 stage IIB      04/10/2016 Initial Biopsy    Additional biopsies left breast 9:30: Sclerosed intraductal papilloma with every 3 hours, right breast 1:00: Complex sclerosing lesion with UDH, right breast 8:00: Complex sclerosing lesion with [facility]      04/17/2016 -  Neo-Adjuvant Chemotherapy    Dose dense Adriamycin and Cytoxan 4 followed by Taxol weekly 12       CHIEF COMPLIANT: Cycle 4 day 1 dose dense Adriamycin Cytoxan  INTERVAL HISTORY: Susan Morrison is a 46 year old with above-mentioned history left breast cancer who is undergoing neoadjuvant chemotherapy and today is cycle 4 day 1 of treatment.  She experienced menorrhagia with her last cycle and her hemoglobin went from 10.2 to 8.6.  She has known fibroids.  She is fatigued, but otherwise well and without questions or concerns today and is ready to proceed with her fourth cycle of treatment.   REVIEW OF SYSTEMS:   Review of Systems  Constitutional: Positive for malaise/fatigue. Negative for chills, fever and weight loss.  HENT: Negative for  hearing loss and tinnitus.   Eyes: Negative for blurred vision and double vision.  Respiratory: Negative for cough and shortness of breath.   Cardiovascular: Negative for chest pain, palpitations and leg swelling.  Gastrointestinal: Negative for abdominal pain, blood in stool, constipation, diarrhea, heartburn, melena, nausea and vomiting.  Genitourinary: Negative for dysuria.  Neurological: Negative for dizziness, tingling, sensory change, focal weakness, weakness and headaches.  Endo/Heme/Allergies: Negative for environmental allergies. Does not bruise/bleed easily.     I have reviewed the past medical history, past surgical history, social history and family history with the patient and they are unchanged from previous note.  ALLERGIES:  has No Known Allergies.  MEDICATIONS:  Current Outpatient Prescriptions  Medication Sig Dispense Refill  . lidocaine-prilocaine (EMLA) cream Apply to affected area once 30 g 3  . LORazepam (ATIVAN) 0.5 MG tablet Take 1 tablet (0.5 mg total) by mouth every 6 (six) hours as needed (Nausea or vomiting). 30 tablet 0  . Multiple Vitamins-Minerals (WOMENS MULTI VITAMIN & MINERAL) TABS Take 1 tablet by mouth daily after breakfast. 30 tablet 3  . ondansetron (ZOFRAN) 8 MG tablet Take 1 tablet (8 mg total) by mouth 2 (two) times daily as needed. Start on the third day after chemotherapy. 30 tablet 1  . prochlorperazine (COMPAZINE) 10 MG tablet Take 1 tablet (10 mg total) by mouth every 6 (six) hours as needed (Nausea or vomiting). 30 tablet 1   No current facility-administered medications for this visit.    Facility-Administered Medications Ordered in Other Visits  Medication Dose Route Frequency Provider Last Rate Last Dose  .  fosaprepitant (EMEND) 150 mg, dexamethasone (DECADRON) 12 mg in sodium chloride 0.9 % 145 mL IVPB   Intravenous Once Nicholas Lose, MD      . heparin lock flush 100 unit/mL  500 Units Intracatheter Once PRN Nicholas Lose, MD      .  heparin lock flush 100 unit/mL  250 Units Intracatheter Once PRN Nicholas Lose, MD      . pegfilgrastim (NEULASTA ONPRO KIT) injection 6 mg  6 mg Subcutaneous Once Nicholas Lose, MD      . sodium chloride flush (NS) 0.9 % injection 10 mL  10 mL Intracatheter PRN Nicholas Lose, MD        PHYSICAL EXAMINATION: ECOG PERFORMANCE STATUS: 1 - Symptomatic but completely ambulatory  Vitals:   05/29/16 1057  BP: 136/75  Pulse: 72  Resp: 20  Temp: 97.9 F (36.6 C)   Filed Weights   05/29/16 1057  Weight: 206 lb (93.4 kg)  GENERAL: Patient is a well appearing female in no acute distress HEENT:  Sclerae anicteric.  PERRL.  Oropharynx clear and moist. No ulcerations or evidence of oropharyngeal candidiasis. Neck is supple.  NODES:  No cervical, supraclavicular, infraclavicular, or axillary lymphadenopathy palpated.  BREAST EXAM:  Deferred. LUNGS:  Clear to auscultation bilaterally.  No wheezes or rhonchi. HEART:  Regular rate and rhythm. No murmur appreciated. ABDOMEN:  Soft, nontender.  Positive, normoactive bowel sounds. No organomegaly palpated. MSK:  No focal spinal tenderness to palpation. Full range of motion bilaterally in the upper extremities. EXTREMITIES:  No peripheral edema.   SKIN:  Clear with no obvious rashes or skin changes. No nail dyscrasia. NEURO:  Nonfocal. Well oriented.  Appropriate affect.    LABORATORY DATA:  I have reviewed the data as listed   Chemistry      Component Value Date/Time   NA 139 05/29/2016 0945   K 3.6 05/29/2016 0945   CL 108 05/15/2016 1147   CO2 22 05/29/2016 0945   BUN 8.2 05/29/2016 0945   CREATININE 0.7 05/29/2016 0945      Component Value Date/Time   CALCIUM 8.6 05/29/2016 0945   ALKPHOS 89 05/29/2016 0945   AST 24 05/29/2016 0945   ALT 27 05/29/2016 0945   BILITOT 0.52 05/29/2016 0945       Lab Results  Component Value Date   WBC 10.6 (H) 05/29/2016   HGB 8.6 (L) 05/29/2016   HCT 27.2 (L) 05/29/2016   MCV 73.9 (L) 05/29/2016     PLT 185 05/29/2016   NEUTROABS 8.2 (H) 05/29/2016    ASSESSMENT & PLAN:  Malignant neoplasm of lower-inner quadrant of left breast in female, estrogen receptor negative ([name]) 03/22/2016: Left breast biopsy 8:00: IDC high-grade with necrosis, left axillary lymph node biopsy IDC, ER 0%, PR 0%, HER-2 negative ratio 1.42, Ki-67 90%; palpable lump 3.5 x 2.6 x 2.2 cm and 2.8 Center left axillary lymph node, T2 N1 stage IIB  Treatment plan based on multidisciplinary tumor board: 1. Neoadjuvant chemotherapy with Adriamycin and Cytoxan dose dense 4 followed by Taxolweekly 12 2. Followed by breast conserving surgery with sentinel lymph node study vs targeted axillary dissection 3. Followed by adjuvant radiation therapy --------------------------------------------------------------------------------------------------------------------------------------------- Current treatment: Cycle 4 day 1 dose dense Adriamycin Cytoxan Echocardiogram 04/09/2016: EF 55-60% Closely monitoring for chemotherapy toxicities.  Chemotherapy toxicities: Denies any major side effects other than fatigue  Microcytic anemia: Status post 2 doses of iron infusion Hemoglobin currently 8.6, will recheck iron studies today.  Renal issues with the left hydronephrosis and chronic  ureteropelvic obstruction: Referred to urology.  Karynn will start Zoladex for her heavy menstrual cycles.  She will receive this monthly.  She will proceed with treatment today, her labs are stable.  We will see her back in 2 weeks for labs, evaluation and first Taxol.  I reviewed Taxol with her today and possible adverse effects.    A total of (30) minutes of face-to-face time was spent with this patient with greater than 50% of that time in counseling and care-coordination.   The patient has a good understanding of the overall plan. she agrees with it. she will call with any problems that may develop before the next visit here.   Scot Dock, NP 05/29/16

## 2016-05-29 NOTE — Telephone Encounter (Signed)
sch pt 5/9 appts per LOS. Pt will come in 5/8 at 1145 am to get labs drawn due to meeting in lab 5/8 at 8 am

## 2016-05-31 ENCOUNTER — Ambulatory Visit: Payer: Self-pay | Admitting: Genetics

## 2016-05-31 ENCOUNTER — Telehealth: Payer: Self-pay | Admitting: Genetics

## 2016-05-31 DIAGNOSIS — Z1379 Encounter for other screening for genetic and chromosomal anomalies: Secondary | ICD-10-CM

## 2016-05-31 NOTE — Telephone Encounter (Deleted)
-----   Message from Mal Misty sent at 05/15/2016  1:04 PM EDT ----- Regarding: Call Results Route to Dr. Lindi Adie. Pt undergoing neoadjuvant chemo. Recommend daughters begin screening at 52.

## 2016-05-31 NOTE — Telephone Encounter (Signed)
Reviewed that germline genetic testing revealed no pathogenic mutations. This is considered to be a negative result. Testing was performed through [facility]'s 46-gene Common Hereditary Cancers Panel. [facility] Common Hereditary Cancers Panel includes analysis of the following 46 genes: APC, ATM, AXIN2, BARD1, BMPR1A, BRCA1, BRCA2, BRIP1, CDH1, CDKN2A, CHEK2, CTNNA1, DICER1, EPCAM, GREM1, HOXB13, KIT, MEN1, MLH1, MSH2, MSH3, MSH6, MUTYH, NBN, NF1, NTHL1, PALB2, PDGFRA, PMS2, POLD1, POLE, PTEN, RAD50, RAD51C, RAD51D, SDHA, SDHB, SDHC, SDHD, SMAD4, SMARCA4, STK11, TP53, TSC1, TSC2, and VHL.  A variant of uncertain significance (VUS) was noted in MUTYH. The specific MUTYH variant is c.1508G>A (p.Gly503Glu). Discussed that this VUS should not change clinical management.  For more detailed discussion, please see genetic counseling documentation from 05/31/2016. Result report dated 05/24/2016.

## 2016-06-03 ENCOUNTER — Encounter: Payer: Self-pay | Admitting: Nurse Practitioner

## 2016-06-03 ENCOUNTER — Encounter: Payer: Self-pay | Admitting: Genetics

## 2016-06-03 ENCOUNTER — Ambulatory Visit (INDEPENDENT_AMBULATORY_CARE_PROVIDER_SITE_OTHER): Payer: Medicaid Other | Admitting: Nurse Practitioner

## 2016-06-03 VITALS — BP 110/60 | HR 84 | Temp 98.4°F | Ht 66.5 in | Wt 206.0 lb

## 2016-06-03 DIAGNOSIS — Z1379 Encounter for other screening for genetic and chromosomal anomalies: Secondary | ICD-10-CM

## 2016-06-03 DIAGNOSIS — H538 Other visual disturbances: Secondary | ICD-10-CM | POA: Diagnosis not present

## 2016-06-03 DIAGNOSIS — H16203 Unspecified keratoconjunctivitis, bilateral: Secondary | ICD-10-CM

## 2016-06-03 HISTORY — DX: Encounter for other screening for genetic and chromosomal anomalies: Z13.79

## 2016-06-03 MED ORDER — POLYETHYL GLYCOL-PROPYL GLYCOL 0.4-0.3 % OP GEL: 1.0000 "application " | OPHTHALMIC | 3 refills | Status: DC | PRN

## 2016-06-03 MED ORDER — CYCLOSPORINE 0.05 % OP EMUL: 1.0000 [drp] | Freq: Two times a day (BID) | OPHTHALMIC | 1 refills | Status: DC

## 2016-06-03 NOTE — Progress Notes (Signed)
Subjective:  Patient ID: Susan Morrison, female    DOB: 1970-08-24  Age: 46 y.o. MRN: [id number]  CC: Follow-up (3 mo f/u. blurr vision 2 days ago--)   Eye Problem   Both eyes are affected.This is a new problem. The current episode started in the past 7 days. The problem occurs constantly. The problem has been unchanged. There was no injury mechanism. The patient is experiencing no pain. There is no known exposure to pink eye. She does not wear contacts. Associated symptoms include blurred vision. Pertinent negatives include no eye discharge, double vision, eye redness, fever, foreign body sensation, itching, nausea, photophobia, recent URI or vomiting. Associated symptoms comments: Increased tearing. She has tried nothing for the symptoms.    Outpatient Medications Prior to Visit  Medication Sig Dispense Refill  . lidocaine-prilocaine (EMLA) cream Apply to affected area once 30 g 3  . LORazepam (ATIVAN) 0.5 MG tablet Take 1 tablet (0.5 mg total) by mouth every 6 (six) hours as needed (Nausea or vomiting). 30 tablet 0  . Multiple Vitamins-Minerals (WOMENS MULTI VITAMIN & MINERAL) TABS Take 1 tablet by mouth daily after breakfast. 30 tablet 3  . ondansetron (ZOFRAN) 8 MG tablet Take 1 tablet (8 mg total) by mouth 2 (two) times daily as needed. Start on the third day after chemotherapy. 30 tablet 1  . prochlorperazine (COMPAZINE) 10 MG tablet Take 1 tablet (10 mg total) by mouth every 6 (six) hours as needed (Nausea or vomiting). 30 tablet 1   No facility-administered medications prior to visit.     ROS See HPI  Objective:  BP 110/60   Pulse 84   Temp 98.4 F (36.9 C)   Ht 5' 6.5" (1.689 m)   Wt 206 lb (93.4 kg)   SpO2 99%   BMI 32.75 kg/m   BP Readings from Last 3 Encounters:  06/03/16 110/60  05/29/16 136/75  05/15/16 126/72    Wt Readings from Last 3 Encounters:  06/03/16 206 lb (93.4 kg)  05/29/16 206 lb (93.4 kg)  05/15/16 205 lb 12.8 oz (93.4 kg)    Physical  Exam  Constitutional: She is oriented to person, place, and time. No distress.  HENT:  Mouth/Throat: No oropharyngeal exudate.  Eyes: Conjunctivae and EOM are normal. Pupils are equal, round, and reactive to light. Right eye exhibits no discharge. Left eye exhibits no discharge. No scleral icterus.  Neck: Normal range of motion. Neck supple.  Cardiovascular: Normal rate, regular rhythm and normal heart sounds.   Pulmonary/Chest: Effort normal and breath sounds normal.  Musculoskeletal: She exhibits no edema.  Lymphadenopathy:    She has no cervical adenopathy.  Neurological: She is alert and oriented to person, place, and time.  Skin: Skin is warm and dry.  Vitals reviewed.   Lab Results  Component Value Date   WBC 10.6 (H) 05/29/2016   HGB 8.6 (L) 05/29/2016   HCT 27.2 (L) 05/29/2016   PLT 185 05/29/2016   GLUCOSE 88 05/29/2016   CHOL 101 03/05/2016   TRIG 101.0 03/05/2016   HDL 37.60 (L) 03/05/2016   LDLCALC 43 03/05/2016   ALT 27 05/29/2016   AST 24 05/29/2016   NA 139 05/29/2016   K 3.6 05/29/2016   CL 108 05/15/2016   CREATININE 0.7 05/29/2016   BUN 8.2 05/29/2016   CO2 22 05/29/2016   TSH 4.39 03/05/2016    Nm Renal Imaging Flow W/pharm  Result Date: 05/16/2016 CLINICAL DATA:  LEFT hydronephrosis.  UPJ obstruction. EXAM: NUCLEAR MEDICINE  RENAL SCAN WITH DIURETIC ADMINISTRATION TECHNIQUE: Radionuclide angiographic and sequential renal images were obtained after intravenous injection of radiopharmaceutical. Imaging was continued during slow intravenous injection of Lasix approximately 15 minutes after the start of the examination. RADIOPHARMACEUTICALS:  5.5 mCi Technetium-30m MAG3 IV COMPARISON:  CT 04/12/2016 FINDINGS: Flow:  Prompt symmetric arterial flow to the kidneys. Left renogram: Delayed renal cortical uptake within LEFT kidney compared to the RIGHT. There is pooling of counts within the renal calices. Pooling of counts within the LEFT renal pelvis LEFT renal  pelvis. Minimal clearance from the renal pelvis. Moderate to large postvoid residual. Right renogram: Uniform uptake of counts in the renal cortex. Counts are promptly excreted into the collecting system and cleared prior to administration of Lasix. Lasix augment clearance. No postvoid residual. Differential: Left kidney = 39 % Right kidney = 61 % T1/2 post Lasix : Left kidney = greater than 25 min.  T 1/2 not achieved. Right kidney = 6.3 min IMPRESSION: 1. Obstructive hydronephrosis of the LEFT kidney at the ureteral pelvic junction. 2. Moderate to large postvoid residual on the LEFT. 3. Normal RIGHT kidney. Electronically Signed   By: Suzy Bouchard M.D.   On: 05/16/2016 17:03    Assessment & Plan:   Susan Morrison was seen today for follow-up.  Diagnoses and all orders for this visit:  Keratoconjunctivitis of both eyes -     Ambulatory referral to Ophthalmology -     Polyethyl Glycol-Propyl Glycol (SYSTANE) 0.4-0.3 % GEL ophthalmic gel; Place 1 application into both eyes as needed. -     cycloSPORINE (RESTASIS) 0.05 % ophthalmic emulsion; Place 1 drop into both eyes 2 (two) times daily.  Blurry vision, bilateral -     Ambulatory referral to Ophthalmology -     Polyethyl Glycol-Propyl Glycol (SYSTANE) 0.4-0.3 % GEL ophthalmic gel; Place 1 application into both eyes as needed. -     cycloSPORINE (RESTASIS) 0.05 % ophthalmic emulsion; Place 1 drop into both eyes 2 (two) times daily.   I am having Susan Morrison start on Polyethyl Glycol-Propyl Glycol and cycloSPORINE. I am also having her maintain her WOMENS MULTI VITAMIN & MINERAL, lidocaine-prilocaine, ondansetron, prochlorperazine, and LORazepam.  Meds ordered this encounter  Medications  . Polyethyl Glycol-Propyl Glycol (SYSTANE) 0.4-0.3 % GEL ophthalmic gel    Sig: Place 1 application into both eyes as needed.    Dispense:  15 mL    Refill:  3    Order Specific Question:   Supervising Provider    Answer:   Cassandria Anger [[id number]]  .  cycloSPORINE (RESTASIS) 0.05 % ophthalmic emulsion    Sig: Place 1 drop into both eyes 2 (two) times daily.    Dispense:  1 each    Refill:  1    Order Specific Question:   Supervising Provider    Answer:   Cassandria Anger [[id number]]    Follow-up: Return if symptoms worsen or fail to improve.  Wilfred Lacy, NP

## 2016-06-03 NOTE — Progress Notes (Signed)
HPI: [name] was previously seen in the [facility] due to a personal and family history of breast cancer and concerns regarding a hereditary predisposition to cancer. Please refer to our prior cancer genetics clinic note for more information regarding [name]'s medical, social and family histories, and our assessment and recommendations, at the time. [name] recent genetic test results were disclosed to her, as were recommendations warranted by these results. These results and recommendations are discussed in more detail below.  CANCER HISTORY:    Malignant neoplasm of lower-inner quadrant of left breast in female, estrogen receptor negative ([place])   03/22/2016 Initial Diagnosis    Left breast biopsy 8:00: IDC high-grade with necrosis, left axillary lymph node biopsy IDC, ER 0%, PR 0%, HER-2 negative ratio 1.42, Ki-67 90%; palpable lump 3.5 x 2.6 x 2.2 cm and 2.8 Center left axillary lymph node, T2 N1 stage IIB      04/10/2016 Initial Biopsy    Additional biopsies left breast 9:30: Sclerosed intraductal papilloma with every 3 hours, right breast 1:00: Complex sclerosing lesion with UDH, right breast 8:00: Complex sclerosing lesion with UDH      04/17/2016 -  Neo-Adjuvant Chemotherapy    Dose dense Adriamycin and Cytoxan 4 followed by Taxol weekly 12      05/15/2016 Genetic Testing    Genetic counseling and testing for hereditary cancer syndromes performed on 05/15/2016. Results are negative for pathogenic mutations in 46 genes analyzed by [facility]'s Common Hereditary Cancers Panel. Results are dated 05/24/2016. Genes tested: APC, ATM, AXIN2, BARD1, BMPR1A, BRCA1, BRCA2, BRIP1, CDH1, CDKN2A, CHEK2, CTNNA1, DICER1, EPCAM, GREM1, HOXB13, KIT, MEN1, MLH1, MSH2, MSH3, MSH6, MUTYH, NBN, NF1, NTHL1, PALB2, PDGFRA, PMS2, POLD1, POLE, PTEN, RAD50, RAD51C, RAD51D, SDHA, SDHB, SDHC, SDHD, SMAD4, SMARCA4, STK11, TP53, TSC1, TSC2, and VHL.  A variant of uncertain significance  (not clinically actionable) was noted in MUTYH.           FAMILY HISTORY:  We obtained a detailed, 4-generation family history.  Significant diagnoses are listed below: Family History  Problem Relation Age of Onset  . Hypertension Mother   . Diabetes Mother   . Breast cancer Paternal Grandmother 4    d.62 bilateral breast cancer   [name] has two daughters and a son (ages 33, 79, and 32) who are all without cancers. She has no full-siblings. She has two maternal half-brothers (ages 43 and 11) and a maternal half-sister (age 46). Her siblings are all without cancers.  [name] father died around the age of 27 from a gunshot wound. Her father has two sisters (ages 85 and 61) without cancers. [name] paternal grandmother died at age 63 from bilateral breast cancer first diagnosed at age 91. This grandmother had a sister who died of lung cancer. [name] paternal grandfather died at 20 without cancers.  [name] mother is 63 without cancers. Her mother has one full-brother and a maternal half-brother and maternal half-sister. There are no known cancers in [name]'s maternal relatives. Her maternal grandparents both died in their 45s without cancers.  [name] is unaware of previous family history of genetic testing for hereditary cancer risks. Patient's maternal and paternal ancestors are of African American descent. There is no reported Ashkenazi Jewish ancestry. There is no known consanguinity.  GENETIC TEST RESULTS: Genetic testing performed through [facility]'s Common Hereditary Cancer Panel reported out on 05/24/2016 showed no deleterious mutations. [facility] Common Hereditary Cancers Panel includes analysis of the following 46 genes:  APC, ATM, AXIN2, BARD1, BMPR1A, BRCA1, BRCA2, BRIP1, CDH1, CDKN2A, CHEK2, CTNNA1, DICER1, EPCAM, GREM1, HOXB13, KIT, MEN1, MLH1, MSH2, MSH3, MSH6, MUTYH, NBN, NF1, NTHL1, PALB2, PDGFRA, PMS2, POLD1, POLE, PTEN, RAD50, RAD51C, RAD51D,  SDHA, SDHB, SDHC, SDHD, SMAD4, SMARCA4, STK11, TP53, TSC1, TSC2, and VHL.  A variant of uncertain significance (VUS) called MUTYH c.1508G>A (p.Gly503Glu) was also noted. At this time, it is unknown if this variant is associated with increased cancer risk or if this is a normal finding, but most variants such as this get reclassified to being inconsequential. It should not be used to make medical management decisions. With time, we suspect the lab will determine the significance of this variant, if any. If we do learn more about it, we will try to contact [name] to discuss it further. However, it is important to stay in touch with [place] periodically and keep the address and phone number up to date.  The test report will be scanned into EPIC and will be located under the Molecular Pathology section of the Results Review tab.A portion of the result report is included below for reference.     We discussed with [name] that since the current genetic testing is not perfect, it is possible there may be a gene mutation in one of these genes that current testing cannot detect, but that chance is small. We also discussed, that it is possible that another gene that has not yet been discovered, or that we have not yet tested, is responsible for the cancer diagnoses in the family. Therefore, important to remain in touch with cancer genetics in the future so that we can continue to offer [name] the most up to date genetic testing.   CANCER SCREENING RECOMMENDATIONS: Given [name] personal and family histories, we must interpret these negative results with some caution.  Families with features suggestive of hereditary risk for cancer tend to have multiple family members with cancer, diagnoses in multiple generations and diagnoses before the age of 49. [name] family exhibits some of these features. Thus this result may simply reflect our current inability to detect all mutations within these genes or  there may be a different gene that has not yet been discovered or tested. However, since no causative or actionable mutations were identified, [name] breast cancer treatments and surveillance must be based on other aspects of her diagnosis rather than these genetic testing results. [name] is encouraged to discuss any questions regarding her treatment plans and surveillance with her referring and treating physicians.  RECOMMENDATIONS FOR FAMILY MEMBERS: Women in this family might be at some increased risk of developing cancer, over the general population risk, simply due to the family history of cancer. We recommended women in this family have a yearly mammogram beginning at age 5, or 70 years younger than the earliest onset of cancer, an annual clinical breast exam, and perform monthly breast self-exams. We discussed that this means [name] daughters should begin annual mammograms at age 63 unless otherwise advised by their physicians. If her daughters have younger breast cancers in their paternal family histories, they may need to begin mammograms even earlier. Women in this family should also have a gynecological exam as recommended by their primary provider. All family members should have a colonoscopy by age 79.  FOLLOW-UP: Lastly, we discussed with [name] that cancer genetics is a rapidly advancing field and it is possible that new genetic tests will be appropriate for her and/or her family members  in the future. We encouraged her to remain in contact with cancer genetics on an annual basis so we can update her personal and family histories and let her know of advances in cancer genetics that may benefit this family.   Our contact number was provided. [name] questions were answered to her satisfaction, and she knows she is welcome to call us at anytime with additional questions or concerns.   Mal Misty, MS, [facility] Certified Naval [email or web address]

## 2016-06-03 NOTE — Progress Notes (Signed)
Pre visit review using our clinic review tool, if applicable. No additional management support is needed unless otherwise documented below in the visit note. 

## 2016-06-03 NOTE — Patient Instructions (Addendum)
Right eye 20/40 Left eye 20/40 Both eyes 20/30  Dry Eye Dry eye, also called keratoconjunctivitis sicca, is dryness of the membranes surrounding the eye. It happens when there are not enough healthy, natural tears in the eyes. The eyes must remain moist at all times. A small amount of tears is constantly produced by the tear glands (lacrimal glands). These glands are located under the outside part of the upper eyelids. Dryness of the eyes can be a symptom of a variety of conditions, such as rheumatoid arthritis, lupus, or Sjgren syndrome. Dry eye may be mild to severe. What are the causes? This condition may be caused by:  Not making enough tears (aqueous tear-deficient dry eyes).  Tears evaporating from the eye too quickly (evaporative dry eyes). This is when there is an abnormality in the quality of your tears. This abnormality causes your tears to evaporate so quickly that the eye cannot be kept moist. What increases the risk? This condition is more likely to happen in:  Women, especially those who have gone through menopause.  People in dry climates.  People in dusty or smoky areas.  People who take certain medicines, such as:  Anti-allergy medicines (antihistamines).  Blood pressure medicines (antihypertensives).  Birth control pills (oral contraceptives).  Laxatives.  Tranquilizers. What are the signs or symptoms? Symptoms in the eyes may include:  Irritation.  Itchiness.  Redness.  Burning.  Inflammation of the eyelids.  Feeling as though something is stuck in the eye.  Light sensitivity.  Increased sensitivity and discomfort when wearing contact lenses, if this applies.  Vision that varies throughout the day.  Occasional excessive tearing. How is this diagnosed? This condition is diagnosed based on your symptoms, your medical history, and an eye exam. Your health care provider may look at your eye using a microscope and may put dyes in your eye to  check the health of the surface of your eye. You may have a tests, such as a test to evaluate your tear production (Schirmer test). During this test, a small strip of special paper is gently pressed into the inner corner of your eye. Your tear production is measured by how much of the paper is moistened by your tears during a set amount of time. You may be referred to a health care provider who specializes in eyes and eyesight (ophthalmologist). How is this treated? This condition is often treated at home. Your health care provider may recommend eye drops, which are also called artificial tears. If your condition is severe, treatment may include:  Prescription eye drops.  Over-the-counter or prescription ointments to moisten your eyes.  Minor surgery to block tears from going into your nose.  Medicines to reduce inflammation of the eyelids. Follow these instructions at home:  Take, use, or apply over-the-counter and prescription medicines only as told by your health care provider. This includes eye drops.  If directed, apply a warm compress to your eyes to help reduce inflammation. Place a towel over your eyes and gently press the warm compress over your eyes for about 5 minutes, or as long as told by your health care provider.  If possible, avoid dry, drafty environments.  Use a humidifier at home to increase moisture in the air.  If you wear contact lenses, remove them regularly to give your eyes a break. Always remove contacts before sleeping.  Keep all follow-up visits as told by your health care provider. This is important. This includes yearly eye exams and vision tests. Contact  a health care provider if:  You have eye pain.  You have pus-like fluid coming from your eye.  Your symptoms get worse or do not improve with treatment. Get help right away if:  Your vision suddenly changes. This information is not intended to replace advice given to you by your health care provider.  Make sure you discuss any questions you have with your health care provider. Document Released: 12/09/2003 Document Revised: 06/29/2015 Document Reviewed: 11/16/2014 Elsevier Interactive Patient Education  2017 [employer].

## 2016-06-04 ENCOUNTER — Encounter: Payer: Self-pay | Admitting: Nurse Practitioner

## 2016-06-04 ENCOUNTER — Telehealth: Payer: Self-pay | Admitting: Nurse Practitioner

## 2016-06-04 DIAGNOSIS — C50312 Malignant neoplasm of lower-inner quadrant of left female breast: Secondary | ICD-10-CM

## 2016-06-04 DIAGNOSIS — Z171 Estrogen receptor negative status [ER-]: Principal | ICD-10-CM

## 2016-06-04 NOTE — Telephone Encounter (Signed)
Patient has regular medicaid at this time.  Did call patient to explain to her that if she is switched to [place] [employer] she would have to see the provider listed on that insurance card.  Patient states she has not seen any social workers and would be interested in any additional community assistance or insurance assistance a Education officer, museum could offer.  Please enter referral.

## 2016-06-06 NOTE — Telephone Encounter (Signed)
done

## 2016-06-11 ENCOUNTER — Ambulatory Visit: Payer: Medicaid Other

## 2016-06-11 ENCOUNTER — Other Ambulatory Visit (HOSPITAL_BASED_OUTPATIENT_CLINIC_OR_DEPARTMENT_OTHER): Payer: Medicaid Other

## 2016-06-11 DIAGNOSIS — C50312 Malignant neoplasm of lower-inner quadrant of left female breast: Secondary | ICD-10-CM

## 2016-06-11 DIAGNOSIS — Z171 Estrogen receptor negative status [ER-]: Principal | ICD-10-CM

## 2016-06-11 DIAGNOSIS — D500 Iron deficiency anemia secondary to blood loss (chronic): Secondary | ICD-10-CM

## 2016-06-11 DIAGNOSIS — Z95828 Presence of other vascular implants and grafts: Secondary | ICD-10-CM | POA: Insufficient documentation

## 2016-06-11 LAB — CBC WITH DIFFERENTIAL/PLATELET
BASO%: 1 % (ref 0.0–2.0)
BASOS ABS: 0.1 10*3/uL (ref 0.0–0.1)
EOS ABS: 0.1 10*3/uL (ref 0.0–0.5)
EOS%: 0.7 % (ref 0.0–7.0)
HEMATOCRIT: 32.3 % — AB (ref 34.8–46.6)
HEMOGLOBIN: 10.5 g/dL — AB (ref 11.6–15.9)
LYMPH#: 0.6 10*3/uL — AB (ref 0.9–3.3)
LYMPH%: 6.4 % — ABNORMAL LOW (ref 14.0–49.7)
MCH: 25.3 pg (ref 25.1–34.0)
MCHC: 32.4 g/dL (ref 31.5–36.0)
MCV: 78 fL — ABNORMAL LOW (ref 79.5–101.0)
MONO#: 1.2 10*3/uL — AB (ref 0.1–0.9)
MONO%: 13.2 % (ref 0.0–14.0)
NEUT#: 7.2 10*3/uL — ABNORMAL HIGH (ref 1.5–6.5)
NEUT%: 78.7 % — ABNORMAL HIGH (ref 38.4–76.8)
PLATELETS: 200 10*3/uL (ref 145–400)
RBC: 4.14 10*6/uL (ref 3.70–5.45)
RDW: 33 % — AB (ref 11.2–14.5)
WBC: 9.2 10*3/uL (ref 3.9–10.3)

## 2016-06-11 LAB — COMPREHENSIVE METABOLIC PANEL
ALK PHOS: 98 U/L (ref 40–150)
ALT: 22 U/L (ref 0–55)
ANION GAP: 7 meq/L (ref 3–11)
AST: 18 U/L (ref 5–34)
Albumin: 3.5 g/dL (ref 3.5–5.0)
BILIRUBIN TOTAL: 0.46 mg/dL (ref 0.20–1.20)
BUN: 11.7 mg/dL (ref 7.0–26.0)
CALCIUM: 9.2 mg/dL (ref 8.4–10.4)
CO2: 24 meq/L (ref 22–29)
Chloride: 108 mEq/L (ref 98–109)
Creatinine: 0.7 mg/dL (ref 0.6–1.1)
Glucose: 95 mg/dl (ref 70–140)
Potassium: 3.8 mEq/L (ref 3.5–5.1)
Sodium: 139 mEq/L (ref 136–145)
TOTAL PROTEIN: 7 g/dL (ref 6.4–8.3)

## 2016-06-11 LAB — TECHNOLOGIST REVIEW

## 2016-06-11 MED ORDER — SODIUM CHLORIDE 0.9% FLUSH
10.0000 mL | Freq: Once | INTRAVENOUS | Status: DC
  Filled 2016-06-11: qty 10

## 2016-06-11 MED ORDER — SODIUM CHLORIDE 0.9% FLUSH
3.0000 mL | Freq: Once | INTRAVENOUS | Status: DC | PRN
  Filled 2016-06-11: qty 10

## 2016-06-11 NOTE — Progress Notes (Signed)
Pt requested blood be drawn through arm instead of port. Kayda Allers LPN

## 2016-06-11 NOTE — Assessment & Plan Note (Signed)
03/22/2016: Left breast biopsy 8:00: IDC high-grade with necrosis, left axillary lymph node biopsy IDC, ER 0%, PR 0%, HER-2 negative ratio 1.42, Ki-67 90%; palpable lump 3.5 x 2.6 x 2.2 cm and 2.8 Center left axillary lymph node, T2 N1 stage IIB  Treatment plan based on multidisciplinary tumor board: 1. Neoadjuvant chemotherapy with Adriamycin and Cytoxan dose dense 4 followed by Taxolweekly 12 2. Followed by breast conserving surgery with sentinel lymph node study vs targeted axillary dissection 3. Followed by adjuvant radiation therapy --------------------------------------------------------------------------------------------------------------------------------------------- Current treatment: Cycle 4 day 1 dose dense Adriamycin Cytoxan Echocardiogram 04/09/2016: EF 55-60% Closely monitoring for chemotherapy toxicities.  Chemotherapy toxicities: Denies any major side effects other than fatigue  Microcytic anemia: Status post 2 units of iron infusion We will recheck her eye and studies in 2 weeks and if she is low she will get 2 more doses of Feraheme.  Renal issues with the left hydronephrosis and chronic ureteropelvic obstruction: Referred to urology.  Return to clinic in 2 weeks for cycle 1 Taxol

## 2016-06-12 ENCOUNTER — Other Ambulatory Visit: Payer: No Typology Code available for payment source

## 2016-06-12 ENCOUNTER — Encounter: Payer: Self-pay | Admitting: Hematology and Oncology

## 2016-06-12 ENCOUNTER — Ambulatory Visit (HOSPITAL_BASED_OUTPATIENT_CLINIC_OR_DEPARTMENT_OTHER): Payer: Medicaid Other

## 2016-06-12 ENCOUNTER — Ambulatory Visit (HOSPITAL_BASED_OUTPATIENT_CLINIC_OR_DEPARTMENT_OTHER): Payer: Medicaid Other | Admitting: Hematology and Oncology

## 2016-06-12 VITALS — BP 115/74 | HR 68 | Temp 99.8°F | Resp 18

## 2016-06-12 DIAGNOSIS — Z5111 Encounter for antineoplastic chemotherapy: Secondary | ICD-10-CM | POA: Diagnosis present

## 2016-06-12 DIAGNOSIS — Z171 Estrogen receptor negative status [ER-]: Secondary | ICD-10-CM | POA: Diagnosis not present

## 2016-06-12 DIAGNOSIS — N130 Hydronephrosis with ureteropelvic junction obstruction: Secondary | ICD-10-CM

## 2016-06-12 DIAGNOSIS — C50312 Malignant neoplasm of lower-inner quadrant of left female breast: Secondary | ICD-10-CM | POA: Diagnosis not present

## 2016-06-12 DIAGNOSIS — D509 Iron deficiency anemia, unspecified: Secondary | ICD-10-CM

## 2016-06-12 DIAGNOSIS — C773 Secondary and unspecified malignant neoplasm of axilla and upper limb lymph nodes: Secondary | ICD-10-CM

## 2016-06-12 MED ORDER — PACLITAXEL CHEMO INJECTION 300 MG/50ML
80.0000 mg/m2 | Freq: Once | INTRAVENOUS | Status: AC
  Administered 2016-06-12: 168 mg via INTRAVENOUS
  Filled 2016-06-12: qty 28

## 2016-06-12 MED ORDER — DIPHENHYDRAMINE HCL 50 MG/ML IJ SOLN
50.0000 mg | Freq: Once | INTRAMUSCULAR | Status: AC
  Administered 2016-06-12: 50 mg via INTRAVENOUS

## 2016-06-12 MED ORDER — DEXAMETHASONE SODIUM PHOSPHATE 10 MG/ML IJ SOLN
INTRAMUSCULAR | Status: AC
  Filled 2016-06-12: qty 1

## 2016-06-12 MED ORDER — DEXAMETHASONE SODIUM PHOSPHATE 100 MG/10ML IJ SOLN
20.0000 mg | Freq: Once | INTRAMUSCULAR | Status: AC
  Administered 2016-06-12: 20 mg via INTRAVENOUS
  Filled 2016-06-12: qty 2

## 2016-06-12 MED ORDER — FAMOTIDINE IN NACL 20-0.9 MG/50ML-% IV SOLN
20.0000 mg | Freq: Once | INTRAVENOUS | Status: AC
  Administered 2016-06-12: 20 mg via INTRAVENOUS

## 2016-06-12 MED ORDER — SODIUM CHLORIDE 0.9 % IV SOLN
Freq: Once | INTRAVENOUS | Status: AC
  Administered 2016-06-12: 09:00:00 via INTRAVENOUS

## 2016-06-12 MED ORDER — HEPARIN SOD (PORK) LOCK FLUSH 100 UNIT/ML IV SOLN
500.0000 [IU] | Freq: Once | INTRAVENOUS | Status: AC | PRN
  Administered 2016-06-12: 500 [IU]
  Filled 2016-06-12: qty 5

## 2016-06-12 MED ORDER — SODIUM CHLORIDE 0.9% FLUSH
10.0000 mL | INTRAVENOUS | Status: DC | PRN
  Administered 2016-06-12: 10 mL
  Filled 2016-06-12: qty 10

## 2016-06-12 MED ORDER — FAMOTIDINE IN NACL 20-0.9 MG/50ML-% IV SOLN
INTRAVENOUS | Status: AC
  Filled 2016-06-12: qty 50

## 2016-06-12 MED ORDER — DIPHENHYDRAMINE HCL 50 MG/ML IJ SOLN
INTRAMUSCULAR | Status: AC
  Filled 2016-06-12: qty 1

## 2016-06-12 NOTE — Progress Notes (Signed)
Patient Care Team: Nche, Charlene Brooke, NP as PCP - General (Internal Medicine) Alphonsa Overall, MD as Consulting Physician (General Surgery) Nicholas Lose, MD as Consulting Physician (Hematology and Oncology) Eppie Gibson, MD as Attending Physician (Radiation Oncology) Gardenia Phlegm, NP as Nurse Practitioner (Hematology and Oncology)  DIAGNOSIS:  Encounter Diagnosis  Name Primary?  . Malignant neoplasm of lower-inner quadrant of left breast in female, estrogen receptor negative ([place])     SUMMARY OF ONCOLOGIC HISTORY:   Malignant neoplasm of lower-inner quadrant of left breast in female, estrogen receptor negative ([place])   03/22/2016 Initial Diagnosis    Left breast biopsy 8:00: IDC high-grade with necrosis, left axillary lymph node biopsy IDC, ER 0%, PR 0%, HER-2 negative ratio 1.42, Ki-67 90%; palpable lump 3.5 x 2.6 x 2.2 cm and 2.8 Center left axillary lymph node, T2 N1 stage IIB      04/10/2016 Initial Biopsy    Additional biopsies left breast 9:30: Sclerosed intraductal papilloma with every 3 hours, right breast 1:00: Complex sclerosing lesion with UDH, right breast 8:00: Complex sclerosing lesion with UDH      04/17/2016 -  Neo-Adjuvant Chemotherapy    Dose dense Adriamycin and Cytoxan 4 followed by Taxol weekly 12      05/15/2016 Genetic Testing    Genetic counseling and testing for hereditary cancer syndromes performed on 05/15/2016. Results are negative for pathogenic mutations in 46 genes analyzed by [facility]'s Common Hereditary Cancers Panel. Results are dated 05/24/2016. Genes tested: APC, ATM, AXIN2, BARD1, BMPR1A, BRCA1, BRCA2, BRIP1, CDH1, CDKN2A, CHEK2, CTNNA1, DICER1, EPCAM, GREM1, HOXB13, KIT, MEN1, MLH1, MSH2, MSH3, MSH6, MUTYH, NBN, NF1, NTHL1, PALB2, PDGFRA, PMS2, POLD1, POLE, PTEN, RAD50, RAD51C, RAD51D, SDHA, SDHB, SDHC, SDHD, SMAD4, SMARCA4, STK11, TP53, TSC1, TSC2, and VHL.  A variant of uncertain significance (not clinically actionable) was noted in  MUTYH.          CHIEF COMPLIANT: Cycle 1 Taxol  INTERVAL HISTORY: Susan Morrison is a 46 year old with above-mentioned history of left breast cancer who is currently on neoadjuvant chemotherapy. She completed 4 cycles of dose dense Adriamycin and Cytoxan and had done extremely well. She did have more fatigue with the last treatment. She has had multiple problems with the vaginal bleeding and was put on Zoladex injection. Since she received injection she has had some decrease in the bleeding but she continues to have some spotting intermittently. Because of iron deficiency anemia we give her IV iron and she appears to have improved with iron infusion. Blood work done yesterday for iron studies seem to show that the iron saturation starting to come down. She has had mild neuropathy after the last Cytoxan however it had resolved currently.  REVIEW OF SYSTEMS:   Constitutional: Denies fevers, chills or abnormal weight loss, severe fatigue Eyes: Denies blurriness of vision Ears, nose, mouth, throat, and face: Denies mucositis or sore throat Respiratory: Denies cough, dyspnea or wheezes Cardiovascular: Denies palpitation, chest discomfort Gastrointestinal:  Denies nausea, heartburn or change in bowel habits Skin: Denies abnormal skin rashes Lymphatics: Denies new lymphadenopathy or easy bruising Neurological: Mild neuropathy that has resolved Behavioral/Psych: Mood is stable, no new changes  Extremities: No lower extremity edema All other systems were reviewed with the patient and are negative.  I have reviewed the past medical history, past surgical history, social history and family history with the patient and they are unchanged from previous note.  ALLERGIES:  has No Known Allergies.  MEDICATIONS:  Current Outpatient Prescriptions  Medication Sig Dispense  Refill  . cycloSPORINE (RESTASIS) 0.05 % ophthalmic emulsion Place 1 drop into both eyes 2 (two) times daily. 1 each 1  .  lidocaine-prilocaine (EMLA) cream Apply to affected area once 30 g 3  . LORazepam (ATIVAN) 0.5 MG tablet Take 1 tablet (0.5 mg total) by mouth every 6 (six) hours as needed (Nausea or vomiting). 30 tablet 0  . Multiple Vitamins-Minerals (WOMENS MULTI VITAMIN & MINERAL) TABS Take 1 tablet by mouth daily after breakfast. 30 tablet 3  . ondansetron (ZOFRAN) 8 MG tablet Take 1 tablet (8 mg total) by mouth 2 (two) times daily as needed. Start on the third day after chemotherapy. 30 tablet 1  . Polyethyl Glycol-Propyl Glycol (SYSTANE) 0.4-0.3 % GEL ophthalmic gel Place 1 application into both eyes as needed. 15 mL 3  . prochlorperazine (COMPAZINE) 10 MG tablet Take 1 tablet (10 mg total) by mouth every 6 (six) hours as needed (Nausea or vomiting). 30 tablet 1   No current facility-administered medications for this visit.     PHYSICAL EXAMINATION: ECOG PERFORMANCE STATUS: 1 - Symptomatic but completely ambulatory  Vitals:   06/12/16 0817  BP: 123/70  Pulse: 68  Resp: 18  Temp: 98.2 F (36.8 C)   Filed Weights   06/12/16 0817  Weight: 204 lb 6.4 oz (92.7 kg)    GENERAL:alert, no distress and comfortable SKIN: skin color, texture, turgor are normal, no rashes or significant lesions EYES: normal, Conjunctiva are pink and non-injected, sclera clear OROPHARYNX:no exudate, no erythema and lips, buccal mucosa, and tongue normal  NECK: supple, thyroid normal size, non-tender, without nodularity LYMPH:  no palpable lymphadenopathy in the cervical, axillary or inguinal LUNGS: clear to auscultation and percussion with normal breathing effort HEART: regular rate & rhythm and no murmurs and no lower extremity edema ABDOMEN:abdomen soft, non-tender and normal bowel sounds MUSCULOSKELETAL:no cyanosis of digits and no clubbing  NEURO: alert & oriented x 3 with fluent speech, no focal motor/sensory deficits EXTREMITIES: No lower extremity edema   LABORATORY DATA:  I have reviewed the data as  listed   Chemistry      Component Value Date/Time   NA 139 06/11/2016 1121   K 3.8 06/11/2016 1121   CL 108 05/15/2016 1147   CO2 24 06/11/2016 1121   BUN 11.7 06/11/2016 1121   CREATININE 0.7 06/11/2016 1121      Component Value Date/Time   CALCIUM 9.2 06/11/2016 1121   ALKPHOS 98 06/11/2016 1121   AST 18 06/11/2016 1121   ALT 22 06/11/2016 1121   BILITOT 0.46 06/11/2016 1121       Lab Results  Component Value Date   WBC 9.2 06/11/2016   HGB 10.5 (L) 06/11/2016   HCT 32.3 (L) 06/11/2016   MCV 78.0 (L) 06/11/2016   PLT 200 06/11/2016   NEUTROABS 7.2 (H) 06/11/2016    ASSESSMENT & PLAN:  Malignant neoplasm of lower-inner quadrant of left breast in female, estrogen receptor negative ([place]) 03/22/2016: Left breast biopsy 8:00: IDC high-grade with necrosis, left axillary lymph node biopsy IDC, ER 0%, PR 0%, HER-2 negative ratio 1.42, Ki-67 90%; palpable lump 3.5 x 2.6 x 2.2 cm and 2.8 Center left axillary lymph node, T2 N1 stage IIB  Treatment plan based on multidisciplinary tumor board: 1. Neoadjuvant chemotherapy with Adriamycin and Cytoxan dose dense 4 followed by Taxolweekly 12 2. Followed by breast conserving surgery with sentinel lymph node study vs targeted axillary dissection 3. Followed by adjuvant radiation therapy --------------------------------------------------------------------------------------------------------------------------------------------- Current treatment: Completed 4 cycles of  dose dense Adriamycin Cytoxan, today is cycle 1 Taxol Echocardiogram 04/09/2016: EF 55-60% Closely monitoring for chemotherapy toxicities.  Chemotherapy toxicities: Denies any major side effects other than fatigue  Microcytic anemia: Status post 2 units of iron infusion Repeat iron studies did not show any need for immediate IV iron infusion.  We will need to recheck this in a month to determine if she needs more IV iron She has had bleeding problems from the  uterus. We gave her Zoladex injection and of April  Renal issues with the left hydronephrosis and chronic ureteropelvic obstruction: Referred to urology. They recommended watchful waiting and monitoring every 3 months.  Return to clinic in 1 weeks for cycle 2 Taxol   I spent 25 minutes talking to the patient of which more than half was spent in counseling and coordination of care.  No orders of the defined types were placed in this encounter.  The patient has a good understanding of the overall plan. she agrees with it. she will call with any problems that may develop before the next visit here.   Rulon Eisenmenger, MD 06/12/16

## 2016-06-18 NOTE — Assessment & Plan Note (Addendum)
(  [place]) 03/22/2016: Left breast biopsy 8:00: IDC high-grade with necrosis, left axillary lymph node biopsy IDC, ER 0%, PR 0%, HER-2 negative ratio 1.42, Ki-67 90%; palpable lump 3.5 x 2.6 x 2.2 cm and 2.8 Center left axillary lymph node, T2 N1 stage IIB  Treatment plan based on multidisciplinary tumor board: 1. Neoadjuvant chemotherapy with Adriamycin and Cytoxan dose dense 4 followed by Taxolweekly 12 2. Followed by breast conserving surgery with sentinel lymph node study vs targeted axillary dissection 3. Followed by adjuvant radiation therapy --------------------------------------------------------------------------------------------------------------------------------------------- Current treatment: Completed 4 cycles of dose dense Adriamycin Cytoxan, today is cycle 2 Taxol Echocardiogram 04/09/2016: EF 55-60% Closely monitoring for chemotherapy toxicities.  Chemotherapy toxicities: Denies any major side effects other than fatigue  Microcytic anemia: Status post iron infusion She has had bleeding problems from the uterus. We gave her Zoladex injection and of April  Renal issues with the left hydronephrosis and chronic ureteropelvic obstruction: Referredto urology. They recommended watchful waiting and monitoring every 3 months.  Return to clinic in 2weeksfor cycle 4 Taxol

## 2016-06-19 ENCOUNTER — Ambulatory Visit (HOSPITAL_BASED_OUTPATIENT_CLINIC_OR_DEPARTMENT_OTHER): Payer: Medicaid Other | Admitting: Hematology and Oncology

## 2016-06-19 ENCOUNTER — Ambulatory Visit: Payer: Medicaid Other

## 2016-06-19 ENCOUNTER — Other Ambulatory Visit (HOSPITAL_BASED_OUTPATIENT_CLINIC_OR_DEPARTMENT_OTHER): Payer: Medicaid Other

## 2016-06-19 ENCOUNTER — Encounter: Payer: Self-pay | Admitting: Hematology and Oncology

## 2016-06-19 ENCOUNTER — Ambulatory Visit (HOSPITAL_BASED_OUTPATIENT_CLINIC_OR_DEPARTMENT_OTHER): Payer: Medicaid Other

## 2016-06-19 VITALS — BP 124/72 | HR 76 | Temp 98.9°F | Resp 18

## 2016-06-19 DIAGNOSIS — C773 Secondary and unspecified malignant neoplasm of axilla and upper limb lymph nodes: Secondary | ICD-10-CM | POA: Diagnosis not present

## 2016-06-19 DIAGNOSIS — Z5111 Encounter for antineoplastic chemotherapy: Secondary | ICD-10-CM

## 2016-06-19 DIAGNOSIS — C50312 Malignant neoplasm of lower-inner quadrant of left female breast: Secondary | ICD-10-CM

## 2016-06-19 DIAGNOSIS — Z171 Estrogen receptor negative status [ER-]: Secondary | ICD-10-CM | POA: Diagnosis not present

## 2016-06-19 DIAGNOSIS — Z95828 Presence of other vascular implants and grafts: Secondary | ICD-10-CM

## 2016-06-19 DIAGNOSIS — D509 Iron deficiency anemia, unspecified: Secondary | ICD-10-CM | POA: Diagnosis not present

## 2016-06-19 DIAGNOSIS — N130 Hydronephrosis with ureteropelvic junction obstruction: Secondary | ICD-10-CM

## 2016-06-19 DIAGNOSIS — D500 Iron deficiency anemia secondary to blood loss (chronic): Secondary | ICD-10-CM

## 2016-06-19 LAB — CBC WITH DIFFERENTIAL/PLATELET
BASO%: 0.9 % (ref 0.0–2.0)
Basophils Absolute: 0.1 10*3/uL (ref 0.0–0.1)
EOS ABS: 0.1 10*3/uL (ref 0.0–0.5)
EOS%: 1.9 % (ref 0.0–7.0)
HCT: 29.7 % — ABNORMAL LOW (ref 34.8–46.6)
HGB: 9.6 g/dL — ABNORMAL LOW (ref 11.6–15.9)
LYMPH%: 8.8 % — ABNORMAL LOW (ref 14.0–49.7)
MCH: 25 pg — ABNORMAL LOW (ref 25.1–34.0)
MCHC: 32.3 g/dL (ref 31.5–36.0)
MCV: 77.3 fL — ABNORMAL LOW (ref 79.5–101.0)
MONO#: 0.9 10*3/uL (ref 0.1–0.9)
MONO%: 12.5 % (ref 0.0–14.0)
NEUT%: 75.9 % (ref 38.4–76.8)
NEUTROS ABS: 5.3 10*3/uL (ref 1.5–6.5)
NRBC: 1 % — AB (ref 0–0)
Platelets: 328 10*3/uL (ref 145–400)
RBC: 3.84 10*6/uL (ref 3.70–5.45)
RDW: 26.7 % — AB (ref 11.2–14.5)
WBC: 7 10*3/uL (ref 3.9–10.3)
lymph#: 0.6 10*3/uL — ABNORMAL LOW (ref 0.9–3.3)

## 2016-06-19 LAB — COMPREHENSIVE METABOLIC PANEL
ALT: 40 U/L (ref 0–55)
ANION GAP: 7 meq/L (ref 3–11)
AST: 27 U/L (ref 5–34)
Albumin: 3.2 g/dL — ABNORMAL LOW (ref 3.5–5.0)
Alkaline Phosphatase: 80 U/L (ref 40–150)
BUN: 11.6 mg/dL (ref 7.0–26.0)
CHLORIDE: 110 meq/L — AB (ref 98–109)
CO2: 24 meq/L (ref 22–29)
Calcium: 9.1 mg/dL (ref 8.4–10.4)
Creatinine: 0.7 mg/dL (ref 0.6–1.1)
EGFR: >90 mL/min/{1.73_m2} (ref >90)
GLUCOSE: 109 mg/dL (ref 70–140)
Potassium: 3.5 mEq/L (ref 3.5–5.1)
SODIUM: 141 meq/L (ref 136–145)
Total Bilirubin: 0.43 mg/dL (ref 0.20–1.20)
Total Protein: 6.7 g/dL (ref 6.4–8.3)

## 2016-06-19 MED ORDER — SODIUM CHLORIDE 0.9 % IV SOLN
Freq: Once | INTRAVENOUS | Status: AC
  Administered 2016-06-19: 10:00:00 via INTRAVENOUS

## 2016-06-19 MED ORDER — FAMOTIDINE IN NACL 20-0.9 MG/50ML-% IV SOLN
INTRAVENOUS | Status: AC
  Filled 2016-06-19: qty 50

## 2016-06-19 MED ORDER — SODIUM CHLORIDE 0.9% FLUSH
3.0000 mL | Freq: Once | INTRAVENOUS | Status: AC | PRN
  Administered 2016-06-19: 10 mL via INTRAVENOUS
  Filled 2016-06-19: qty 10

## 2016-06-19 MED ORDER — HEPARIN SOD (PORK) LOCK FLUSH 100 UNIT/ML IV SOLN
250.0000 [IU] | Freq: Once | INTRAVENOUS | Status: AC | PRN
Start: 2016-06-19 — End: 2016-06-19
  Administered 2016-06-19: 250 [IU]
  Filled 2016-06-19: qty 5

## 2016-06-19 MED ORDER — DEXAMETHASONE SODIUM PHOSPHATE 100 MG/10ML IJ SOLN
20.0000 mg | Freq: Once | INTRAMUSCULAR | Status: AC
  Administered 2016-06-19: 20 mg via INTRAVENOUS
  Filled 2016-06-19: qty 2

## 2016-06-19 MED ORDER — DIPHENHYDRAMINE HCL 50 MG/ML IJ SOLN
50.0000 mg | Freq: Once | INTRAMUSCULAR | Status: AC
  Administered 2016-06-19: 50 mg via INTRAVENOUS

## 2016-06-19 MED ORDER — DIPHENHYDRAMINE HCL 50 MG/ML IJ SOLN
INTRAMUSCULAR | Status: AC
  Filled 2016-06-19: qty 1

## 2016-06-19 MED ORDER — FAMOTIDINE IN NACL 20-0.9 MG/50ML-% IV SOLN
20.0000 mg | Freq: Once | INTRAVENOUS | Status: AC
  Administered 2016-06-19: 20 mg via INTRAVENOUS

## 2016-06-19 MED ORDER — PACLITAXEL CHEMO INJECTION 300 MG/50ML
80.0000 mg/m2 | Freq: Once | INTRAVENOUS | Status: AC
  Administered 2016-06-19: 168 mg via INTRAVENOUS
  Filled 2016-06-19: qty 28

## 2016-06-19 MED ORDER — SODIUM CHLORIDE 0.9% FLUSH
10.0000 mL | INTRAVENOUS | Status: DC | PRN
  Administered 2016-06-19: 10 mL
  Filled 2016-06-19: qty 10

## 2016-06-19 MED ORDER — HEPARIN SOD (PORK) LOCK FLUSH 100 UNIT/ML IV SOLN
500.0000 [IU] | Freq: Once | INTRAVENOUS | Status: AC | PRN
  Administered 2016-06-19: 500 [IU]
  Filled 2016-06-19: qty 5

## 2016-06-19 NOTE — Progress Notes (Signed)
Patient Care Team: Nche, Charlene Brooke, NP as PCP - General (Internal Medicine) Alphonsa Overall, MD as Consulting Physician (General Surgery) Nicholas Lose, MD as Consulting Physician (Hematology and Oncology) Eppie Gibson, MD as Attending Physician (Radiation Oncology) Gardenia Phlegm, NP as Nurse Practitioner (Hematology and Oncology)  DIAGNOSIS:  Encounter Diagnosis  Name Primary?  . Malignant neoplasm of lower-inner quadrant of left breast in female, estrogen receptor negative ([place])     SUMMARY OF ONCOLOGIC HISTORY:   Malignant neoplasm of lower-inner quadrant of left breast in female, estrogen receptor negative ([place])   03/22/2016 Initial Diagnosis    Left breast biopsy 8:00: IDC high-grade with necrosis, left axillary lymph node biopsy IDC, ER 0%, PR 0%, HER-2 negative ratio 1.42, Ki-67 90%; palpable lump 3.5 x 2.6 x 2.2 cm and 2.8 Center left axillary lymph node, T2 N1 stage IIB      04/10/2016 Initial Biopsy    Additional biopsies left breast 9:30: Sclerosed intraductal papilloma with every 3 hours, right breast 1:00: Complex sclerosing lesion with UDH, right breast 8:00: Complex sclerosing lesion with UDH      04/17/2016 -  Neo-Adjuvant Chemotherapy    Dose dense Adriamycin and Cytoxan 4 followed by Taxol weekly 12      05/15/2016 Genetic Testing    Genetic counseling and testing for hereditary cancer syndromes performed on 05/15/2016. Results are negative for pathogenic mutations in 46 genes analyzed by [facility]'s Common Hereditary Cancers Panel. Results are dated 05/24/2016. Genes tested: APC, ATM, AXIN2, BARD1, BMPR1A, BRCA1, BRCA2, BRIP1, CDH1, CDKN2A, CHEK2, CTNNA1, DICER1, EPCAM, GREM1, HOXB13, KIT, MEN1, MLH1, MSH2, MSH3, MSH6, MUTYH, NBN, NF1, NTHL1, PALB2, PDGFRA, PMS2, POLD1, POLE, PTEN, RAD50, RAD51C, RAD51D, SDHA, SDHB, SDHC, SDHD, SMAD4, SMARCA4, STK11, TP53, TSC1, TSC2, and VHL.  A variant of uncertain significance (not clinically actionable) was noted in  MUTYH.          CHIEF COMPLIANT: Cycle 2 Taxol  INTERVAL HISTORY: ELOYCE BULTMAN is a 46 year old with above-mentioned history of left breast cancer currently on neoadjuvant chemotherapy. Today is cycle 2 of Taxol. She is tolerating Taxol extremely well. Did not have any nausea vomiting. Her taste is coming back and she is eating better. Denies any neuropathy.  REVIEW OF SYSTEMS:   Constitutional: Denies fevers, chills or abnormal weight loss, complains of fatigue Eyes: Denies blurriness of vision Ears, nose, mouth, throat, and face: Denies mucositis or sore throat Respiratory: Denies cough, dyspnea or wheezes Cardiovascular: Denies palpitation, chest discomfort Gastrointestinal:  Denies nausea, heartburn or change in bowel habits Skin: Denies abnormal skin rashes Lymphatics: Denies new lymphadenopathy or easy bruising Neurological:Denies numbness, tingling or new weaknesses Behavioral/Psych: Mood is stable, no new changes  Extremities: No lower extremity edema  All other systems were reviewed with the patient and are negative.  I have reviewed the past medical history, past surgical history, social history and family history with the patient and they are unchanged from previous note.  ALLERGIES:  has No Known Allergies.  MEDICATIONS:  Current Outpatient Prescriptions  Medication Sig Dispense Refill  . cycloSPORINE (RESTASIS) 0.05 % ophthalmic emulsion Place 1 drop into both eyes 2 (two) times daily. 1 each 1  . lidocaine-prilocaine (EMLA) cream Apply to affected area once 30 g 3  . LORazepam (ATIVAN) 0.5 MG tablet Take 1 tablet (0.5 mg total) by mouth every 6 (six) hours as needed (Nausea or vomiting). 30 tablet 0  . Multiple Vitamins-Minerals (WOMENS MULTI VITAMIN & MINERAL) TABS Take 1 tablet by mouth daily after  breakfast. 30 tablet 3  . ondansetron (ZOFRAN) 8 MG tablet Take 1 tablet (8 mg total) by mouth 2 (two) times daily as needed. Start on the third day after  chemotherapy. 30 tablet 1  . Polyethyl Glycol-Propyl Glycol (SYSTANE) 0.4-0.3 % GEL ophthalmic gel Place 1 application into both eyes as needed. 15 mL 3  . prochlorperazine (COMPAZINE) 10 MG tablet Take 1 tablet (10 mg total) by mouth every 6 (six) hours as needed (Nausea or vomiting). 30 tablet 1   No current facility-administered medications for this visit.     PHYSICAL EXAMINATION: ECOG PERFORMANCE STATUS: 1 - Symptomatic but completely ambulatory  Vitals:   06/19/16 0902  BP: 123/78  Pulse: 72  Resp: 17  Temp: 98.7 F (37.1 C)   Filed Weights   06/19/16 0902  Weight: 205 lb 14.4 oz (93.4 kg)    GENERAL:alert, no distress and comfortable SKIN: skin color, texture, turgor are normal, no rashes or significant lesions EYES: normal, Conjunctiva are pink and non-injected, sclera clear OROPHARYNX:no exudate, no erythema and lips, buccal mucosa, and tongue normal  NECK: supple, thyroid normal size, non-tender, without nodularity LYMPH:  no palpable lymphadenopathy in the cervical, axillary or inguinal LUNGS: clear to auscultation and percussion with normal breathing effort HEART: regular rate & rhythm and no murmurs and no lower extremity edema ABDOMEN:abdomen soft, non-tender and normal bowel sounds MUSCULOSKELETAL:no cyanosis of digits and no clubbing  NEURO: alert & oriented x 3 with fluent speech, no focal motor/sensory deficits EXTREMITIES: No lower extremity edema  LABORATORY DATA:  I have reviewed the data as listed   Chemistry      Component Value Date/Time   NA 141 06/19/2016 0828   K 3.5 06/19/2016 0828   CL 108 05/15/2016 1147   CO2 24 06/19/2016 0828   BUN 11.6 06/19/2016 0828   CREATININE 0.7 06/19/2016 0828      Component Value Date/Time   CALCIUM 9.1 06/19/2016 0828   ALKPHOS 80 06/19/2016 0828   AST 27 06/19/2016 0828   ALT 40 06/19/2016 0828   BILITOT 0.43 06/19/2016 0828       Lab Results  Component Value Date   WBC 7.0 06/19/2016   HGB 9.6  (L) 06/19/2016   HCT 29.7 (L) 06/19/2016   MCV 77.3 (L) 06/19/2016   PLT 328 06/19/2016   NEUTROABS 5.3 06/19/2016    ASSESSMENT & PLAN:  Malignant neoplasm of lower-inner quadrant of left breast in female, estrogen receptor negative ([place]) ([place]) 03/22/2016: Left breast biopsy 8:00: IDC high-grade with necrosis, left axillary lymph node biopsy IDC, ER 0%, PR 0%, HER-2 negative ratio 1.42, Ki-67 90%; palpable lump 3.5 x 2.6 x 2.2 cm and 2.8 Center left axillary lymph node, T2 N1 stage IIB  Treatment plan based on multidisciplinary tumor board: 1. Neoadjuvant chemotherapy with Adriamycin and Cytoxan dose dense 4 followed by Taxolweekly 12 2. Followed by breast conserving surgery with sentinel lymph node study vs targeted axillary dissection 3. Followed by adjuvant radiation therapy --------------------------------------------------------------------------------------------------------------------------------------------- Current treatment: Completed 4 cycles of dose dense Adriamycin Cytoxan, today is cycle 2 Taxol Echocardiogram 04/09/2016: EF 55-60% Closely monitoring for chemotherapy toxicities.  Chemotherapy toxicities: Denies any major side effects other than fatigue  Microcytic anemia: Status post iron infusion She has had bleeding problems from the uterus. We gave her Zoladex injection end of April  Renal issues with the left hydronephrosis and chronic ureteropelvic obstruction: Referredto urology. They recommended watchful waiting and monitoring every 3 months.  Return to clinic in 2weeksfor  cycle 4 Taxol  I spent 25 minutes talking to the patient of which more than half was spent in counseling and coordination of care.  No orders of the defined types were placed in this encounter.  The patient has a good understanding of the overall plan. she agrees with it. she will call with any problems that may develop before the next visit here.   Rulon Eisenmenger,  MD 06/19/16

## 2016-06-19 NOTE — Patient Instructions (Signed)
[facility] Discharge Instructions for Patients Receiving Chemotherapy  Today you received the following chemotherapy agents Taxol   To help prevent nausea and vomiting after your treatment, we encourage you to take your nausea medication as directed.   If you develop nausea and vomiting that is not controlled by your nausea medication, call the clinic.   BELOW ARE SYMPTOMS THAT SHOULD BE REPORTED IMMEDIATELY:  *FEVER GREATER THAN 100.5 F  *CHILLS WITH OR WITHOUT FEVER  NAUSEA AND VOMITING THAT IS NOT CONTROLLED WITH YOUR NAUSEA MEDICATION  *UNUSUAL SHORTNESS OF BREATH  *UNUSUAL BRUISING OR BLEEDING  TENDERNESS IN MOUTH AND THROAT WITH OR WITHOUT PRESENCE OF ULCERS  *URINARY PROBLEMS  *BOWEL PROBLEMS  UNUSUAL RASH Items with * indicate a potential emergency and should be followed up as soon as possible.  Feel free to call the clinic you have any questions or concerns. The clinic phone number is [phone].  Please show the CHEMO ALERT CARD at check-in to the Emergency Department and triage nurse.   

## 2016-06-26 ENCOUNTER — Ambulatory Visit (HOSPITAL_BASED_OUTPATIENT_CLINIC_OR_DEPARTMENT_OTHER): Payer: Medicaid Other

## 2016-06-26 ENCOUNTER — Ambulatory Visit: Payer: Medicaid Other

## 2016-06-26 ENCOUNTER — Other Ambulatory Visit (HOSPITAL_BASED_OUTPATIENT_CLINIC_OR_DEPARTMENT_OTHER): Payer: Medicaid Other

## 2016-06-26 VITALS — BP 122/67 | HR 70 | Temp 98.5°F | Resp 18

## 2016-06-26 DIAGNOSIS — C50312 Malignant neoplasm of lower-inner quadrant of left female breast: Secondary | ICD-10-CM

## 2016-06-26 DIAGNOSIS — D500 Iron deficiency anemia secondary to blood loss (chronic): Secondary | ICD-10-CM

## 2016-06-26 DIAGNOSIS — Z95828 Presence of other vascular implants and grafts: Secondary | ICD-10-CM

## 2016-06-26 DIAGNOSIS — Z171 Estrogen receptor negative status [ER-]: Principal | ICD-10-CM

## 2016-06-26 DIAGNOSIS — Z5111 Encounter for antineoplastic chemotherapy: Secondary | ICD-10-CM

## 2016-06-26 DIAGNOSIS — C773 Secondary and unspecified malignant neoplasm of axilla and upper limb lymph nodes: Secondary | ICD-10-CM | POA: Diagnosis not present

## 2016-06-26 LAB — COMPREHENSIVE METABOLIC PANEL
ALBUMIN: 3.4 g/dL — AB (ref 3.5–5.0)
ALT: 65 U/L — AB (ref 0–55)
AST: 42 U/L — ABNORMAL HIGH (ref 5–34)
Alkaline Phosphatase: 99 U/L (ref 40–150)
Anion Gap: 11 mEq/L (ref 3–11)
BILIRUBIN TOTAL: 0.74 mg/dL (ref 0.20–1.20)
BUN: 6.5 mg/dL — ABNORMAL LOW (ref 7.0–26.0)
CO2: 23 meq/L (ref 22–29)
Calcium: 9.5 mg/dL (ref 8.4–10.4)
Chloride: 107 mEq/L (ref 98–109)
Creatinine: 0.7 mg/dL (ref 0.6–1.1)
EGFR: >90 mL/min/{1.73_m2} (ref >90)
GLUCOSE: 107 mg/dL (ref 70–140)
Potassium: 3.5 mEq/L (ref 3.5–5.1)
Sodium: 142 mEq/L (ref 136–145)
TOTAL PROTEIN: 6.7 g/dL (ref 6.4–8.3)

## 2016-06-26 LAB — CBC WITH DIFFERENTIAL/PLATELET
BASO%: 0.9 % (ref 0.0–2.0)
Basophils Absolute: 0 10*3/uL (ref 0.0–0.1)
EOS ABS: 0.3 10*3/uL (ref 0.0–0.5)
EOS%: 7.3 % — AB (ref 0.0–7.0)
HCT: 32 % — ABNORMAL LOW (ref 34.8–46.6)
HEMOGLOBIN: 10.4 g/dL — AB (ref 11.6–15.9)
LYMPH#: 0.6 10*3/uL — AB (ref 0.9–3.3)
LYMPH%: 13.1 % — ABNORMAL LOW (ref 14.0–49.7)
MCH: 25.1 pg (ref 25.1–34.0)
MCHC: 32.5 g/dL (ref 31.5–36.0)
MCV: 77.3 fL — ABNORMAL LOW (ref 79.5–101.0)
MONO#: 0.5 10*3/uL (ref 0.1–0.9)
MONO%: 10.4 % (ref 0.0–14.0)
NEUT#: 3.1 10*3/uL (ref 1.5–6.5)
NEUT%: 68.3 % (ref 38.4–76.8)
NRBC: 0 % (ref 0–0)
PLATELETS: 324 10*3/uL (ref 145–400)
RBC: 4.14 10*6/uL (ref 3.70–5.45)
RDW: 24.6 % — AB (ref 11.2–14.5)
WBC: 4.5 10*3/uL (ref 3.9–10.3)

## 2016-06-26 MED ORDER — SODIUM CHLORIDE 0.9% FLUSH
10.0000 mL | Freq: Once | INTRAVENOUS | Status: AC
  Administered 2016-06-26: 10 mL
  Filled 2016-06-26: qty 10

## 2016-06-26 MED ORDER — PACLITAXEL CHEMO INJECTION 300 MG/50ML
80.0000 mg/m2 | Freq: Once | INTRAVENOUS | Status: AC
  Administered 2016-06-26: 168 mg via INTRAVENOUS
  Filled 2016-06-26: qty 28

## 2016-06-26 MED ORDER — HEPARIN SOD (PORK) LOCK FLUSH 100 UNIT/ML IV SOLN
500.0000 [IU] | Freq: Once | INTRAVENOUS | Status: AC | PRN
  Administered 2016-06-26: 500 [IU]
  Filled 2016-06-26: qty 5

## 2016-06-26 MED ORDER — SODIUM CHLORIDE 0.9 % IV SOLN
20.0000 mg | Freq: Once | INTRAVENOUS | Status: AC
  Administered 2016-06-26: 20 mg via INTRAVENOUS
  Filled 2016-06-26: qty 2

## 2016-06-26 MED ORDER — FAMOTIDINE IN NACL 20-0.9 MG/50ML-% IV SOLN
INTRAVENOUS | Status: AC
Start: 2016-06-26 — End: 2016-06-26
  Filled 2016-06-26: qty 50

## 2016-06-26 MED ORDER — GOSERELIN ACETATE 3.6 MG ~~LOC~~ IMPL
3.6000 mg | DRUG_IMPLANT | Freq: Once | SUBCUTANEOUS | Status: AC
  Administered 2016-06-26: 3.6 mg via SUBCUTANEOUS
  Filled 2016-06-26: qty 3.6

## 2016-06-26 MED ORDER — SODIUM CHLORIDE 0.9 % IV SOLN
Freq: Once | INTRAVENOUS | Status: AC
  Administered 2016-06-26: 09:00:00 via INTRAVENOUS

## 2016-06-26 MED ORDER — DIPHENHYDRAMINE HCL 50 MG/ML IJ SOLN
50.0000 mg | Freq: Once | INTRAMUSCULAR | Status: AC
  Administered 2016-06-26: 50 mg via INTRAVENOUS

## 2016-06-26 MED ORDER — FAMOTIDINE IN NACL 20-0.9 MG/50ML-% IV SOLN
20.0000 mg | Freq: Once | INTRAVENOUS | Status: AC
  Administered 2016-06-26: 20 mg via INTRAVENOUS

## 2016-06-26 MED ORDER — SODIUM CHLORIDE 0.9% FLUSH
10.0000 mL | INTRAVENOUS | Status: DC | PRN
  Administered 2016-06-26: 10 mL
  Filled 2016-06-26: qty 10

## 2016-06-26 MED ORDER — DIPHENHYDRAMINE HCL 50 MG/ML IJ SOLN
INTRAMUSCULAR | Status: AC
  Filled 2016-06-26: qty 1

## 2016-06-26 NOTE — Patient Instructions (Signed)
Implanted Port Home Guide An implanted port is a type of central line that is placed under the skin. Central lines are used to provide IV access when treatment or nutrition needs to be given through a person's veins. Implanted ports are used for long-term IV access. An implanted port may be placed because:  You need IV medicine that would be irritating to the small veins in your hands or arms.  You need long-term IV medicines, such as antibiotics.  You need IV nutrition for a long period.  You need frequent blood draws for lab tests.  You need dialysis.  Implanted ports are usually placed in the chest area, but they can also be placed in the upper arm, the abdomen, or the leg. An implanted port has two main parts:  Reservoir. The reservoir is round and will appear as a small, raised area under your skin. The reservoir is the part where a needle is inserted to give medicines or draw blood.  Catheter. The catheter is a thin, flexible tube that extends from the reservoir. The catheter is placed into a large vein. Medicine that is inserted into the reservoir goes into the catheter and then into the vein.  How will I care for my incision site? Do not get the incision site wet. Bathe or shower as directed by your health care provider. How is my port accessed? Special steps must be taken to access the port:  Before the port is accessed, a numbing cream can be placed on the skin. This helps numb the skin over the port site.  Your health care provider uses a sterile technique to access the port. ? Your health care provider must put on a mask and sterile gloves. ? The skin over your port is cleaned carefully with an antiseptic and allowed to dry. ? The port is gently pinched between sterile gloves, and a needle is inserted into the port.  Only "non-coring" port needles should be used to access the port. Once the port is accessed, a blood return should be checked. This helps ensure that the port  is in the vein and is not clogged.  If your port needs to remain accessed for a constant infusion, a clear (transparent) bandage will be placed over the needle site. The bandage and needle will need to be changed every week, or as directed by your health care provider.  Keep the bandage covering the needle clean and dry. Do not get it wet. Follow your health care provider's instructions on how to take a shower or bath while the port is accessed.  If your port does not need to stay accessed, no bandage is needed over the port.  What is flushing? Flushing helps keep the port from getting clogged. Follow your health care provider's instructions on how and when to flush the port. Ports are usually flushed with saline solution or a medicine called heparin. The need for flushing will depend on how the port is used.  If the port is used for intermittent medicines or blood draws, the port will need to be flushed: ? After medicines have been given. ? After blood has been drawn. ? As part of routine maintenance.  If a constant infusion is running, the port may not need to be flushed.  How long will my port stay implanted? The port can stay in for as long as your health care provider thinks it is needed. When it is time for the port to come out, surgery will be   done to remove it. The procedure is similar to the one performed when the port was put in. When should I seek immediate medical care? When you have an implanted port, you should seek immediate medical care if:  You notice a bad smell coming from the incision site.  You have swelling, redness, or drainage at the incision site.  You have more swelling or pain at the port site or the surrounding area.  You have a fever that is not controlled with medicine.  This information is not intended to replace advice given to you by your health care provider. Make sure you discuss any questions you have with your health care provider. Document  Released: 01/21/2005 Document Revised: 06/29/2015 Document Reviewed: 09/28/2012 Elsevier Interactive Patient Education  2017 [employer]vier Inc.  

## 2016-07-02 NOTE — Assessment & Plan Note (Signed)
03/22/2016: Left breast biopsy 8:00: IDC high-grade with necrosis, left axillary lymph node biopsy IDC, ER 0%, PR 0%, HER-2 negative ratio 1.42, Ki-67 90%; palpable lump 3.5 x 2.6 x 2.2 cm and 2.8 Center left axillary lymph node, T2 N1 stage IIB  Treatment plan based on multidisciplinary tumor board: 1. Neoadjuvant chemotherapy with Adriamycin and Cytoxan dose dense 4 followed by Taxolweekly 12 2. Followed by breast conserving surgery with sentinel lymph node study vs targeted axillary dissection 3. Followed by adjuvant radiation therapy --------------------------------------------------------------------------------------------------------------------------------------------- Current treatment: Completed 4 cycles of dose dense Adriamycin Cytoxan, today is cycle 4 Taxol Echocardiogram 04/09/2016: EF 55-60% Closely monitoring for chemotherapy toxicities.  Chemotherapy toxicities: Denies any major side effects other than fatigue  Microcytic anemia: Status post iron infusion She has had bleeding problems from the uterus. We gave her Zoladex injection end of April  Renal issues with the left hydronephrosis and chronic ureteropelvic obstruction: Referredto urology.They recommended watchful waiting and monitoring every 3 months.  Return to clinic in 2weeksfor cycle 6Taxol

## 2016-07-03 ENCOUNTER — Ambulatory Visit: Payer: Medicaid Other

## 2016-07-03 ENCOUNTER — Encounter: Payer: Self-pay | Admitting: Hematology and Oncology

## 2016-07-03 ENCOUNTER — Ambulatory Visit (HOSPITAL_BASED_OUTPATIENT_CLINIC_OR_DEPARTMENT_OTHER): Payer: Medicaid Other | Admitting: Hematology and Oncology

## 2016-07-03 ENCOUNTER — Ambulatory Visit (HOSPITAL_BASED_OUTPATIENT_CLINIC_OR_DEPARTMENT_OTHER): Payer: Medicaid Other

## 2016-07-03 ENCOUNTER — Other Ambulatory Visit (HOSPITAL_BASED_OUTPATIENT_CLINIC_OR_DEPARTMENT_OTHER): Payer: Medicaid Other

## 2016-07-03 DIAGNOSIS — Z5111 Encounter for antineoplastic chemotherapy: Secondary | ICD-10-CM | POA: Diagnosis present

## 2016-07-03 DIAGNOSIS — G629 Polyneuropathy, unspecified: Secondary | ICD-10-CM

## 2016-07-03 DIAGNOSIS — Z171 Estrogen receptor negative status [ER-]: Principal | ICD-10-CM

## 2016-07-03 DIAGNOSIS — C50312 Malignant neoplasm of lower-inner quadrant of left female breast: Secondary | ICD-10-CM

## 2016-07-03 DIAGNOSIS — N130 Hydronephrosis with ureteropelvic junction obstruction: Secondary | ICD-10-CM

## 2016-07-03 DIAGNOSIS — D509 Iron deficiency anemia, unspecified: Secondary | ICD-10-CM | POA: Diagnosis not present

## 2016-07-03 LAB — CBC WITH DIFFERENTIAL/PLATELET
BASO%: 1.4 % (ref 0.0–2.0)
BASOS ABS: 0.1 10*3/uL (ref 0.0–0.1)
EOS ABS: 0.4 10*3/uL (ref 0.0–0.5)
EOS%: 9.5 % — ABNORMAL HIGH (ref 0.0–7.0)
HEMATOCRIT: 33.5 % — AB (ref 34.8–46.6)
HGB: 11.1 g/dL — ABNORMAL LOW (ref 11.6–15.9)
LYMPH#: 0.6 10*3/uL — AB (ref 0.9–3.3)
LYMPH%: 13.9 % — AB (ref 14.0–49.7)
MCH: 25.7 pg (ref 25.1–34.0)
MCHC: 33.2 g/dL (ref 31.5–36.0)
MCV: 77.4 fL — AB (ref 79.5–101.0)
MONO#: 0.7 10*3/uL (ref 0.1–0.9)
MONO%: 14.3 % — ABNORMAL HIGH (ref 0.0–14.0)
NEUT#: 2.8 10*3/uL (ref 1.5–6.5)
NEUT%: 60.9 % (ref 38.4–76.8)
PLATELETS: 270 10*3/uL (ref 145–400)
RBC: 4.33 10*6/uL (ref 3.70–5.45)
RDW: 24.8 % — ABNORMAL HIGH (ref 11.2–14.5)
WBC: 4.6 10*3/uL (ref 3.9–10.3)

## 2016-07-03 LAB — COMPREHENSIVE METABOLIC PANEL
ALT: 73 U/L — AB (ref 0–55)
ANION GAP: 9 meq/L (ref 3–11)
AST: 60 U/L — ABNORMAL HIGH (ref 5–34)
Albumin: 3.3 g/dL — ABNORMAL LOW (ref 3.5–5.0)
Alkaline Phosphatase: 103 U/L (ref 40–150)
BUN: 8.5 mg/dL (ref 7.0–26.0)
CALCIUM: 9.4 mg/dL (ref 8.4–10.4)
CHLORIDE: 107 meq/L (ref 98–109)
CO2: 23 mEq/L (ref 22–29)
CREATININE: 0.7 mg/dL (ref 0.6–1.1)
Glucose: 88 mg/dl (ref 70–140)
Potassium: 3.7 mEq/L (ref 3.5–5.1)
Sodium: 139 mEq/L (ref 136–145)
Total Bilirubin: 0.7 mg/dL (ref 0.20–1.20)
Total Protein: 6.8 g/dL (ref 6.4–8.3)

## 2016-07-03 MED ORDER — HEPARIN SOD (PORK) LOCK FLUSH 100 UNIT/ML IV SOLN
500.0000 [IU] | Freq: Once | INTRAVENOUS | Status: AC | PRN
  Administered 2016-07-03: 500 [IU]
  Filled 2016-07-03: qty 5

## 2016-07-03 MED ORDER — SODIUM CHLORIDE 0.9 % IV SOLN
Freq: Once | INTRAVENOUS | Status: AC
  Administered 2016-07-03: 09:00:00 via INTRAVENOUS

## 2016-07-03 MED ORDER — SODIUM CHLORIDE 0.9 % IV SOLN
20.0000 mg | Freq: Once | INTRAVENOUS | Status: AC
  Administered 2016-07-03: 20 mg via INTRAVENOUS
  Filled 2016-07-03: qty 2

## 2016-07-03 MED ORDER — FAMOTIDINE IN NACL 20-0.9 MG/50ML-% IV SOLN
20.0000 mg | Freq: Once | INTRAVENOUS | Status: AC
Start: 2016-07-03 — End: 2016-07-03
  Administered 2016-07-03: 20 mg via INTRAVENOUS

## 2016-07-03 MED ORDER — DIPHENHYDRAMINE HCL 50 MG/ML IJ SOLN
INTRAMUSCULAR | Status: AC
  Filled 2016-07-03: qty 1

## 2016-07-03 MED ORDER — DIPHENHYDRAMINE HCL 50 MG/ML IJ SOLN
50.0000 mg | Freq: Once | INTRAMUSCULAR | Status: AC
  Administered 2016-07-03: 50 mg via INTRAVENOUS

## 2016-07-03 MED ORDER — MELOXICAM 15 MG PO TABS: 15.0000 mg | ORAL_TABLET | Freq: Two times a day (BID) | ORAL | 3 refills | Status: DC

## 2016-07-03 MED ORDER — SODIUM CHLORIDE 0.9% FLUSH
10.0000 mL | INTRAVENOUS | Status: DC | PRN
  Administered 2016-07-03: 10 mL
  Filled 2016-07-03: qty 10

## 2016-07-03 MED ORDER — PACLITAXEL CHEMO INJECTION 300 MG/50ML
80.0000 mg/m2 | Freq: Once | INTRAVENOUS | Status: AC
  Administered 2016-07-03: 168 mg via INTRAVENOUS
  Filled 2016-07-03: qty 28

## 2016-07-03 MED ORDER — FAMOTIDINE IN NACL 20-0.9 MG/50ML-% IV SOLN
INTRAVENOUS | Status: AC
  Filled 2016-07-03: qty 50

## 2016-07-03 NOTE — Patient Instructions (Signed)
Implanted Port Home Guide An implanted port is a type of central line that is placed under the skin. Central lines are used to provide IV access when treatment or nutrition needs to be given through a person's veins. Implanted ports are used for long-term IV access. An implanted port may be placed because:  You need IV medicine that would be irritating to the small veins in your hands or arms.  You need long-term IV medicines, such as antibiotics.  You need IV nutrition for a long period.  You need frequent blood draws for lab tests.  You need dialysis.  Implanted ports are usually placed in the chest area, but they can also be placed in the upper arm, the abdomen, or the leg. An implanted port has two main parts:  Reservoir. The reservoir is round and will appear as a small, raised area under your skin. The reservoir is the part where a needle is inserted to give medicines or draw blood.  Catheter. The catheter is a thin, flexible tube that extends from the reservoir. The catheter is placed into a large vein. Medicine that is inserted into the reservoir goes into the catheter and then into the vein.  How will I care for my incision site? Do not get the incision site wet. Bathe or shower as directed by your health care provider. How is my port accessed? Special steps must be taken to access the port:  Before the port is accessed, a numbing cream can be placed on the skin. This helps numb the skin over the port site.  Your health care provider uses a sterile technique to access the port. ? Your health care provider must put on a mask and sterile gloves. ? The skin over your port is cleaned carefully with an antiseptic and allowed to dry. ? The port is gently pinched between sterile gloves, and a needle is inserted into the port.  Only "non-coring" port needles should be used to access the port. Once the port is accessed, a blood return should be checked. This helps ensure that the port  is in the vein and is not clogged.  If your port needs to remain accessed for a constant infusion, a clear (transparent) bandage will be placed over the needle site. The bandage and needle will need to be changed every week, or as directed by your health care provider.  Keep the bandage covering the needle clean and dry. Do not get it wet. Follow your health care provider's instructions on how to take a shower or bath while the port is accessed.  If your port does not need to stay accessed, no bandage is needed over the port.  What is flushing? Flushing helps keep the port from getting clogged. Follow your health care provider's instructions on how and when to flush the port. Ports are usually flushed with saline solution or a medicine called heparin. The need for flushing will depend on how the port is used.  If the port is used for intermittent medicines or blood draws, the port will need to be flushed: ? After medicines have been given. ? After blood has been drawn. ? As part of routine maintenance.  If a constant infusion is running, the port may not need to be flushed.  How long will my port stay implanted? The port can stay in for as long as your health care provider thinks it is needed. When it is time for the port to come out, surgery will be   done to remove it. The procedure is similar to the one performed when the port was put in. When should I seek immediate medical care? When you have an implanted port, you should seek immediate medical care if:  You notice a bad smell coming from the incision site.  You have swelling, redness, or drainage at the incision site.  You have more swelling or pain at the port site or the surrounding area.  You have a fever that is not controlled with medicine.  This information is not intended to replace advice given to you by your health care provider. Make sure you discuss any questions you have with your health care provider. Document  Released: 01/21/2005 Document Revised: 06/29/2015 Document Reviewed: 09/28/2012 Elsevier Interactive Patient Education  2017 [employer]vier Inc.  

## 2016-07-03 NOTE — Progress Notes (Signed)
Patient Care Team: Nche, Charlene Brooke, NP as PCP - General (Internal Medicine) Alphonsa Overall, MD as Consulting Physician (General Surgery) Nicholas Lose, MD as Consulting Physician (Hematology and Oncology) Eppie Gibson, MD as Attending Physician (Radiation Oncology) Gardenia Phlegm, NP as Nurse Practitioner (Hematology and Oncology)  DIAGNOSIS:  Encounter Diagnosis  Name Primary?  . 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 Results are negative for pathogenic mutations in 46 genes analyzed by [facility]'s Common Hereditary Cancers Panel. Results are dated 05/24/2016. Genes tested: APC, ATM, AXIN2, BARD1, BMPR1A, BRCA1, BRCA2, BRIP1, CDH1, CDKN2A, CHEK2, CTNNA1, DICER1, EPCAM, GREM1, HOXB13, KIT, MEN1, MLH1, MSH2, MSH3, MSH6, MUTYH, NBN, NF1, NTHL1, PALB2, PDGFRA, PMS2, POLD1, POLE, PTEN, RAD50, RAD51C, RAD51D, SDHA, SDHB, SDHC, SDHD, SMAD4, SMARCA4, STK11, TP53, TSC1, TSC2, and VHL.  A variant of uncertain significance (not clinically actionable) was noted in  MUTYH.          CHIEF COMPLIANT: Cycle 4 Taxol  INTERVAL HISTORY: BRITISH MOYD is a 46 year old with above-mentioned history of left breast cancer being treated with neoadjuvant chemotherapy and today she is here to receive cycle 4 of Taxol. She has been tolerating Taxol reasonably well. She denies any nausea vomiting. Neuropathy is intermittent and fairly mild.  REVIEW OF SYSTEMS:   Constitutional: Denies fevers, chills or abnormal weight loss Eyes: Denies blurriness of vision Ears, nose, mouth, throat, and face: Denies mucositis or sore throat Respiratory: Denies cough, dyspnea or wheezes Cardiovascular: Denies palpitation, chest discomfort Gastrointestinal:  Denies nausea, heartburn or change in bowel habits Skin: Denies abnormal skin rashes Lymphatics: Denies new lymphadenopathy or easy bruising Neurological:Denies numbness, tingling or new weaknesses Behavioral/Psych: Mood is stable, no new changes  Extremities: No lower extremity edema All other systems were reviewed with the patient and are negative.  I have reviewed the past medical history, past surgical history, social history and family history with the patient and they are unchanged from previous note.  ALLERGIES:  has No Known Allergies.  MEDICATIONS:  Current Outpatient Prescriptions  Medication Sig Dispense Refill  . cycloSPORINE (RESTASIS) 0.05 % ophthalmic emulsion Place 1 drop into both eyes 2 (two) times daily. 1 each 1  . lidocaine-prilocaine (EMLA) cream Apply to affected area once 30 g 3  . LORazepam (ATIVAN) 0.5 MG tablet Take 1 tablet (0.5 mg total) by mouth every 6 (six) hours as needed (Nausea or vomiting). 30 tablet 0  . meloxicam (MOBIC) 15 MG tablet Take 1 tablet (15 mg total) by mouth 2 (two) times daily. 60 tablet 3  .  Multiple Vitamins-Minerals (WOMENS MULTI VITAMIN & MINERAL) TABS Take 1 tablet by mouth daily after breakfast. 30 tablet 3  . ondansetron (ZOFRAN) 8 MG tablet Take 1 tablet (8 mg  total) by mouth 2 (two) times daily as needed. Start on the third day after chemotherapy. 30 tablet 1  . Polyethyl Glycol-Propyl Glycol (SYSTANE) 0.4-0.3 % GEL ophthalmic gel Place 1 application into both eyes as needed. 15 mL 3  . prochlorperazine (COMPAZINE) 10 MG tablet Take 1 tablet (10 mg total) by mouth every 6 (six) hours as needed (Nausea or vomiting). 30 tablet 1   No current facility-administered medications for this visit.    Facility-Administered Medications Ordered in Other Visits  Medication Dose Route Frequency Provider Last Rate Last Dose  . sodium chloride flush (NS) 0.9 % injection 10 mL  10 mL Intracatheter PRN Nicholas Lose, MD   10 mL at 07/03/16 1122    PHYSICAL EXAMINATION: ECOG PERFORMANCE STATUS: 1 - Symptomatic but completely ambulatory  Vitals:   07/03/16 0829  BP: 122/80  Pulse: 72  Resp: 18  Temp: 98.4 F (36.9 C)   Filed Weights   07/03/16 0829  Weight: 204 lb 3.2 oz (92.6 kg)    GENERAL:alert, no distress and comfortable SKIN: skin color, texture, turgor are normal, no rashes or significant lesions EYES: normal, Conjunctiva are pink and non-injected, sclera clear OROPHARYNX:no exudate, no erythema and lips, buccal mucosa, and tongue normal  NECK: supple, thyroid normal size, non-tender, without nodularity LYMPH:  no palpable lymphadenopathy in the cervical, axillary or inguinal LUNGS: clear to auscultation and percussion with normal breathing effort HEART: regular rate & rhythm and no murmurs and no lower extremity edema ABDOMEN:abdomen soft, non-tender and normal bowel sounds MUSCULOSKELETAL:no cyanosis of digits and no clubbing  NEURO: alert & oriented x 3 with fluent speech, no focal motor/sensory deficits EXTREMITIES: No lower extremity edema  LABORATORY DATA:  I have reviewed the data as listed   Chemistry      Component Value Date/Time   NA 139 07/03/2016 0743   K 3.7 07/03/2016 0743   CL 108 05/15/2016 1147   CO2 23 07/03/2016 0743    BUN 8.5 07/03/2016 0743   CREATININE 0.7 07/03/2016 0743      Component Value Date/Time   CALCIUM 9.4 07/03/2016 0743   ALKPHOS 103 07/03/2016 0743   AST 60 (H) 07/03/2016 0743   ALT 73 (H) 07/03/2016 0743   BILITOT 0.70 07/03/2016 0743       Lab Results  Component Value Date   WBC 4.6 07/03/2016   HGB 11.1 (L) 07/03/2016   HCT 33.5 (L) 07/03/2016   MCV 77.4 (L) 07/03/2016   PLT 270 07/03/2016   NEUTROABS 2.8 07/03/2016    ASSESSMENT & PLAN:  Malignant neoplasm of lower-inner quadrant of left breast in female, estrogen receptor negative ([place]) 03/22/2016: Left breast biopsy 8:00: IDC high-grade with necrosis, left axillary lymph node biopsy IDC, ER 0%, PR 0%, HER-2 negative ratio 1.42, Ki-67 90%; palpable lump 3.5 x 2.6 x 2.2 cm and 2.8 Center left axillary lymph node, T2 N1 stage IIB  Treatment plan based on multidisciplinary tumor board: 1. Neoadjuvant chemotherapy with Adriamycin and Cytoxan dose dense 4 followed by Taxolweekly 12 2. Followed by breast conserving surgery with sentinel lymph node study vs targeted axillary dissection 3. Followed by adjuvant radiation therapy --------------------------------------------------------------------------------------------------------------------------------------------- Current treatment: Completed 4 cycles of dose dense Adriamycin Cytoxan, today is cycle 4 Taxol Echocardiogram 04/09/2016: EF 55-60% Closely monitoring for chemotherapy toxicities.  Chemotherapy  toxicities: Denies any major side effects other than fatigue  Microcytic anemia: Status post iron infusion She has had bleeding problems from the uterus. On Zoladex injection last given in the end of April  Renal issues with the left hydronephrosis and chronic ureteropelvic obstruction: Referredto urology.They recommended watchful waiting and monitoring every 3 months.  Return to clinic in 2weeksfor cycle 6Taxol  I spent 25 minutes talking to the  patient of which more than half was spent in counseling and coordination of care.  No orders of the defined types were placed in this encounter.  The patient has a good understanding of the overall plan. she agrees with it. she will call with any problems that may develop before the next visit here.   Rulon Eisenmenger, MD 07/03/16

## 2016-07-03 NOTE — Patient Instructions (Signed)
[facility] Discharge Instructions for Patients Receiving Chemotherapy  Today you received the following chemotherapy agents Taxol   To help prevent nausea and vomiting after your treatment, we encourage you to take your nausea medication as directed.   If you develop nausea and vomiting that is not controlled by your nausea medication, call the clinic.   BELOW ARE SYMPTOMS THAT SHOULD BE REPORTED IMMEDIATELY:  *FEVER GREATER THAN 100.5 F  *CHILLS WITH OR WITHOUT FEVER  NAUSEA AND VOMITING THAT IS NOT CONTROLLED WITH YOUR NAUSEA MEDICATION  *UNUSUAL SHORTNESS OF BREATH  *UNUSUAL BRUISING OR BLEEDING  TENDERNESS IN MOUTH AND THROAT WITH OR WITHOUT PRESENCE OF ULCERS  *URINARY PROBLEMS  *BOWEL PROBLEMS  UNUSUAL RASH Items with * indicate a potential emergency and should be followed up as soon as possible.  Feel free to call the clinic you have any questions or concerns. The clinic phone number is [phone].  Please show the CHEMO ALERT CARD at check-in to the Emergency Department and triage nurse.   

## 2016-07-10 ENCOUNTER — Ambulatory Visit: Payer: Medicaid Other

## 2016-07-10 ENCOUNTER — Other Ambulatory Visit (HOSPITAL_BASED_OUTPATIENT_CLINIC_OR_DEPARTMENT_OTHER): Payer: Medicaid Other

## 2016-07-10 ENCOUNTER — Ambulatory Visit (HOSPITAL_BASED_OUTPATIENT_CLINIC_OR_DEPARTMENT_OTHER): Payer: Medicaid Other

## 2016-07-10 VITALS — BP 129/84 | HR 69 | Temp 98.8°F | Resp 18

## 2016-07-10 DIAGNOSIS — Z171 Estrogen receptor negative status [ER-]: Principal | ICD-10-CM

## 2016-07-10 DIAGNOSIS — D500 Iron deficiency anemia secondary to blood loss (chronic): Secondary | ICD-10-CM

## 2016-07-10 DIAGNOSIS — C50312 Malignant neoplasm of lower-inner quadrant of left female breast: Secondary | ICD-10-CM

## 2016-07-10 DIAGNOSIS — C773 Secondary and unspecified malignant neoplasm of axilla and upper limb lymph nodes: Secondary | ICD-10-CM | POA: Diagnosis not present

## 2016-07-10 DIAGNOSIS — Z95828 Presence of other vascular implants and grafts: Secondary | ICD-10-CM

## 2016-07-10 DIAGNOSIS — Z5111 Encounter for antineoplastic chemotherapy: Secondary | ICD-10-CM

## 2016-07-10 LAB — CBC WITH DIFFERENTIAL/PLATELET
BASO%: 1.1 % (ref 0.0–2.0)
Basophils Absolute: 0.1 10*3/uL (ref 0.0–0.1)
EOS ABS: 0.4 10*3/uL (ref 0.0–0.5)
EOS%: 8.6 % — ABNORMAL HIGH (ref 0.0–7.0)
HEMATOCRIT: 33.9 % — AB (ref 34.8–46.6)
HGB: 11.2 g/dL — ABNORMAL LOW (ref 11.6–15.9)
LYMPH%: 15.3 % (ref 14.0–49.7)
MCH: 25.6 pg (ref 25.1–34.0)
MCHC: 33.2 g/dL (ref 31.5–36.0)
MCV: 77.2 fL — ABNORMAL LOW (ref 79.5–101.0)
MONO#: 0.6 10*3/uL (ref 0.1–0.9)
MONO%: 13.1 % (ref 0.0–14.0)
NEUT%: 61.9 % (ref 38.4–76.8)
NEUTROS ABS: 3 10*3/uL (ref 1.5–6.5)
PLATELETS: 259 10*3/uL (ref 145–400)
RBC: 4.38 10*6/uL (ref 3.70–5.45)
RDW: 23.4 % — ABNORMAL HIGH (ref 11.2–14.5)
WBC: 4.8 10*3/uL (ref 3.9–10.3)
lymph#: 0.7 10*3/uL — ABNORMAL LOW (ref 0.9–3.3)

## 2016-07-10 LAB — COMPREHENSIVE METABOLIC PANEL
ALBUMIN: 3.3 g/dL — AB (ref 3.5–5.0)
ALK PHOS: 105 U/L (ref 40–150)
ALT: 55 U/L (ref 0–55)
AST: 41 U/L — ABNORMAL HIGH (ref 5–34)
Anion Gap: 9 mEq/L (ref 3–11)
BILIRUBIN TOTAL: 0.72 mg/dL (ref 0.20–1.20)
BUN: 7.7 mg/dL (ref 7.0–26.0)
CALCIUM: 9.4 mg/dL (ref 8.4–10.4)
CO2: 23 mEq/L (ref 22–29)
Chloride: 108 mEq/L (ref 98–109)
Creatinine: 0.7 mg/dL (ref 0.6–1.1)
Glucose: 91 mg/dl (ref 70–140)
Potassium: 3.8 mEq/L (ref 3.5–5.1)
Sodium: 140 mEq/L (ref 136–145)
TOTAL PROTEIN: 6.8 g/dL (ref 6.4–8.3)

## 2016-07-10 MED ORDER — HEPARIN SOD (PORK) LOCK FLUSH 100 UNIT/ML IV SOLN
500.0000 [IU] | Freq: Once | INTRAVENOUS | Status: AC | PRN
  Administered 2016-07-10: 500 [IU]
  Filled 2016-07-10: qty 5

## 2016-07-10 MED ORDER — SODIUM CHLORIDE 0.9% FLUSH
10.0000 mL | INTRAVENOUS | Status: DC | PRN
  Administered 2016-07-10: 10 mL
  Filled 2016-07-10: qty 10

## 2016-07-10 MED ORDER — SODIUM CHLORIDE 0.9% FLUSH
10.0000 mL | Freq: Once | INTRAVENOUS | Status: AC
  Administered 2016-07-10: 10 mL
  Filled 2016-07-10: qty 10

## 2016-07-10 MED ORDER — DIPHENHYDRAMINE HCL 50 MG/ML IJ SOLN
50.0000 mg | Freq: Once | INTRAMUSCULAR | Status: AC
  Administered 2016-07-10: 50 mg via INTRAVENOUS

## 2016-07-10 MED ORDER — DEXAMETHASONE SODIUM PHOSPHATE 100 MG/10ML IJ SOLN
20.0000 mg | Freq: Once | INTRAMUSCULAR | Status: AC
  Administered 2016-07-10: 20 mg via INTRAVENOUS
  Filled 2016-07-10: qty 2

## 2016-07-10 MED ORDER — PACLITAXEL CHEMO INJECTION 300 MG/50ML
80.0000 mg/m2 | Freq: Once | INTRAVENOUS | Status: AC
  Administered 2016-07-10: 168 mg via INTRAVENOUS
  Filled 2016-07-10: qty 28

## 2016-07-10 MED ORDER — FAMOTIDINE IN NACL 20-0.9 MG/50ML-% IV SOLN
20.0000 mg | Freq: Once | INTRAVENOUS | Status: AC
  Administered 2016-07-10: 20 mg via INTRAVENOUS

## 2016-07-10 MED ORDER — DIPHENHYDRAMINE HCL 50 MG/ML IJ SOLN
INTRAMUSCULAR | Status: AC
  Filled 2016-07-10: qty 1

## 2016-07-10 MED ORDER — SODIUM CHLORIDE 0.9 % IV SOLN
Freq: Once | INTRAVENOUS | Status: AC
  Administered 2016-07-10: 08:00:00 via INTRAVENOUS

## 2016-07-10 MED ORDER — FAMOTIDINE IN NACL 20-0.9 MG/50ML-% IV SOLN
INTRAVENOUS | Status: AC
  Filled 2016-07-10: qty 50

## 2016-07-10 NOTE — Patient Instructions (Signed)
Implanted Port Home Guide An implanted port is a type of central line that is placed under the skin. Central lines are used to provide IV access when treatment or nutrition needs to be given through a person's veins. Implanted ports are used for long-term IV access. An implanted port may be placed because:  You need IV medicine that would be irritating to the small veins in your hands or arms.  You need long-term IV medicines, such as antibiotics.  You need IV nutrition for a long period.  You need frequent blood draws for lab tests.  You need dialysis.  Implanted ports are usually placed in the chest area, but they can also be placed in the upper arm, the abdomen, or the leg. An implanted port has two main parts:  Reservoir. The reservoir is round and will appear as a small, raised area under your skin. The reservoir is the part where a needle is inserted to give medicines or draw blood.  Catheter. The catheter is a thin, flexible tube that extends from the reservoir. The catheter is placed into a large vein. Medicine that is inserted into the reservoir goes into the catheter and then into the vein.  How will I care for my incision site? Do not get the incision site wet. Bathe or shower as directed by your health care provider. How is my port accessed? Special steps must be taken to access the port:  Before the port is accessed, a numbing cream can be placed on the skin. This helps numb the skin over the port site.  Your health care provider uses a sterile technique to access the port. ? Your health care provider must put on a mask and sterile gloves. ? The skin over your port is cleaned carefully with an antiseptic and allowed to dry. ? The port is gently pinched between sterile gloves, and a needle is inserted into the port.  Only "non-coring" port needles should be used to access the port. Once the port is accessed, a blood return should be checked. This helps ensure that the port  is in the vein and is not clogged.  If your port needs to remain accessed for a constant infusion, a clear (transparent) bandage will be placed over the needle site. The bandage and needle will need to be changed every week, or as directed by your health care provider.  Keep the bandage covering the needle clean and dry. Do not get it wet. Follow your health care provider's instructions on how to take a shower or bath while the port is accessed.  If your port does not need to stay accessed, no bandage is needed over the port.  What is flushing? Flushing helps keep the port from getting clogged. Follow your health care provider's instructions on how and when to flush the port. Ports are usually flushed with saline solution or a medicine called heparin. The need for flushing will depend on how the port is used.  If the port is used for intermittent medicines or blood draws, the port will need to be flushed: ? After medicines have been given. ? After blood has been drawn. ? As part of routine maintenance.  If a constant infusion is running, the port may not need to be flushed.  How long will my port stay implanted? The port can stay in for as long as your health care provider thinks it is needed. When it is time for the port to come out, surgery will be   done to remove it. The procedure is similar to the one performed when the port was put in. When should I seek immediate medical care? When you have an implanted port, you should seek immediate medical care if:  You notice a bad smell coming from the incision site.  You have swelling, redness, or drainage at the incision site.  You have more swelling or pain at the port site or the surrounding area.  You have a fever that is not controlled with medicine.  This information is not intended to replace advice given to you by your health care provider. Make sure you discuss any questions you have with your health care provider. Document  Released: 01/21/2005 Document Revised: 06/29/2015 Document Reviewed: 09/28/2012 Elsevier Interactive Patient Education  2017 [employer]vier Inc.  

## 2016-07-10 NOTE — Patient Instructions (Signed)
[facility] Discharge Instructions for Patients Receiving Chemotherapy  Today you received the following chemotherapy agents Taxol   To help prevent nausea and vomiting after your treatment, we encourage you to take your nausea medication as directed.   If you develop nausea and vomiting that is not controlled by your nausea medication, call the clinic.   BELOW ARE SYMPTOMS THAT SHOULD BE REPORTED IMMEDIATELY:  *FEVER GREATER THAN 100.5 F  *CHILLS WITH OR WITHOUT FEVER  NAUSEA AND VOMITING THAT IS NOT CONTROLLED WITH YOUR NAUSEA MEDICATION  *UNUSUAL SHORTNESS OF BREATH  *UNUSUAL BRUISING OR BLEEDING  TENDERNESS IN MOUTH AND THROAT WITH OR WITHOUT PRESENCE OF ULCERS  *URINARY PROBLEMS  *BOWEL PROBLEMS  UNUSUAL RASH Items with * indicate a potential emergency and should be followed up as soon as possible.  Feel free to call the clinic you have any questions or concerns. The clinic phone number is [phone].  Please show the CHEMO ALERT CARD at check-in to the Emergency Department and triage nurse.   

## 2016-07-16 ENCOUNTER — Ambulatory Visit (HOSPITAL_BASED_OUTPATIENT_CLINIC_OR_DEPARTMENT_OTHER)
Admission: RE | Admit: 2016-07-16 | Discharge: 2016-07-16 | Disposition: A | Discharge status: Home / Self Care | Payer: Medicaid Other | Source: Ambulatory Visit | Attending: Internal Medicine | Admitting: Internal Medicine

## 2016-07-16 ENCOUNTER — Other Ambulatory Visit (HOSPITAL_COMMUNITY): Payer: Self-pay | Admitting: *Deleted

## 2016-07-16 ENCOUNTER — Ambulatory Visit (HOSPITAL_COMMUNITY)
Admission: RE | Admit: 2016-07-16 | Discharge: 2016-07-16 | Disposition: A | Discharge status: Home / Self Care | Payer: Medicaid Other | Source: Ambulatory Visit | Attending: Internal Medicine | Admitting: Internal Medicine

## 2016-07-16 VITALS — BP 145/99 | HR 70 | Wt 206.8 lb

## 2016-07-16 DIAGNOSIS — Z79899 Other long term (current) drug therapy: Secondary | ICD-10-CM | POA: Diagnosis not present

## 2016-07-16 DIAGNOSIS — I10 Essential (primary) hypertension: Secondary | ICD-10-CM

## 2016-07-16 DIAGNOSIS — Z171 Estrogen receptor negative status [ER-]: Secondary | ICD-10-CM

## 2016-07-16 DIAGNOSIS — C50312 Malignant neoplasm of lower-inner quadrant of left female breast: Secondary | ICD-10-CM

## 2016-07-16 DIAGNOSIS — Z833 Family history of diabetes mellitus: Secondary | ICD-10-CM | POA: Diagnosis not present

## 2016-07-16 DIAGNOSIS — Z8249 Family history of ischemic heart disease and other diseases of the circulatory system: Secondary | ICD-10-CM | POA: Insufficient documentation

## 2016-07-16 LAB — ECHOCARDIOGRAM COMPLETE
E decel time: 236 msec
EERAT: 4.89
FS: 28 % (ref 28–44)
IVS/LV PW RATIO, ED: 1
LA ID, A-P, ES: 47 mm
LA vol A4C: 62.6 ml
LA vol index: 32.1 mL/m2
LADIAMINDEX: 2.32 cm/m2
LAVOL: 65.1 mL
LDCA: 4.15 cm2
LEFT ATRIUM END SYS DIAM: 47 mm
LV E/e' medial: 4.89
LV PW d: 9.41 mm — AB (ref 0.6–1.1)
LV TDI E'LATERAL: 12.2
LV TDI E'MEDIAL: 6.09
LV e' LATERAL: 12.2 cm/s
LVEEAVG: 4.89
LVOT VTI: 21.8 cm
LVOT peak grad rest: 5 mmHg
LVOTD: 23 mm
LVOTPV: 109 cm/s
LVOTSV: 90 mL
MV Dec: 236
MV pk E vel: 59.7 m/s
MVPKAVEL: 72.3 m/s
RV LATERAL S' VELOCITY: 13.6 cm/s
RV TAPSE: 26.7 mm

## 2016-07-16 NOTE — Assessment & Plan Note (Signed)
03/22/2016: Left breast biopsy 8:00: IDC high-grade with necrosis, left axillary lymph node biopsy IDC, ER 0%, PR 0%, HER-2 negative ratio 1.42, Ki-67 90%; palpable lump 3.5 x 2.6 x 2.2 cm and 2.8 Center left axillary lymph node, T2 N1 stage IIB  Treatment plan based on multidisciplinary tumor board: 1. Neoadjuvant chemotherapy with Adriamycin and Cytoxan dose dense 4 followed by Taxolweekly 12 2. Followed by breast conserving surgery with sentinel lymph node study vs targeted axillary dissection 3. Followed by adjuvant radiation therapy --------------------------------------------------------------------------------------------------------------------------------------------- Current treatment: Completed 4 cycles of dose dense Adriamycin Cytoxan, today is cycle 4Taxol Echocardiogram 04/09/2016: EF 55-60% Closely monitoring for chemotherapy toxicities.  Chemotherapy toxicities: Denies any major side effects other than fatigue  Microcytic anemia: Status post iron infusion She has had bleeding problems from the uterus. On Zoladex injection last given in the end of April  Renal issues with the left hydronephrosis and chronic ureteropelvic obstruction: Referredto urology.They recommended watchful waiting and monitoring every 3 months.  Return to clinic in 2weeksfor cycle 8Taxol

## 2016-07-16 NOTE — Progress Notes (Signed)
CARDIO-ONCOLOGY CLINIC CONSULT NOTE  Referring Physician: Primary Care: Primary Cardiologist:  HPI:  Mr. Randleman is a 46 y.o. female with left breast cancer referred by Dr. Lindi Adie for enrollment into the Cardio-Oncology program.     Malignant neoplasm of lower-inner quadrant of left breast in female, estrogen receptor negative ([place])   03/22/2016 Initial Diagnosis    Left breast biopsy 8:00: IDC high-grade with necrosis, left axillary lymph node biopsy IDC, ER 0%, PR 0%, HER-2 negative ratio 1.42, Ki-67 90%; palpable lump 3.5 x 2.6 x 2.2 cm and 2.8 Center left axillary lymph node, T2 N1 stage IIB      04/10/2016 Initial Biopsy    Additional biopsies left breast 9:30: Sclerosed intraductal papilloma with every 3 hours, right breast 1:00: Complex sclerosing lesion with UDH, right breast 8:00: Complex sclerosing lesion with UDH      04/17/2016 -  Neo-Adjuvant Chemotherapy    Dose dense Adriamycin and Cytoxan 4 followed by Taxol weekly 12      05/15/2016 Genetic Testing    Genetic counseling and testing for hereditary cancer syndromes performed on 05/15/2016. Results are negative for pathogenic mutations in 46 genes analyzed by [facility]'s Common Hereditary Cancers Panel. Results are dated 05/24/2016. Genes tested: APC, ATM, AXIN2, BARD1, BMPR1A, BRCA1, BRCA2, BRIP1, CDH1, CDKN2A, CHEK2, CTNNA1, DICER1, EPCAM, GREM1, HOXB13, KIT, MEN1, MLH1, MSH2, MSH3, MSH6, MUTYH, NBN, NF1, NTHL1, PALB2, PDGFRA, PMS2, POLD1, POLE, PTEN, RAD50, RAD51C, RAD51D, SDHA, SDHB, SDHC, SDHD, SMAD4, SMARCA4, STK11, TP53, TSC1, TSC2, and VHL.  A variant of uncertain significance (not clinically actionable) was noted in MUTYH.          She denies any significant past medical history. No h/o heart disease. Has completed dose-dense Adriamycin and Cytoxan 4. Now getting Taxol weekly 12. Denies dyspnea or swelling. Pending surgery and XRT.  ECHO:Reviewed personally.   07/16/16: 55-60% Lateral  s' 9.9 cm/s GLS not performed in all segments.   Underestimated in 4-chamber   4 chamber GLS -13.3%   2 chamber GLS -20.3%   Mean -16.8%    Review of Systems: [y] = yes, [ ] = no   General: Weight gain [ ]; Weight loss [ ]; Anorexia [ ]; Fatigue [ ]; Fever [ ]; Chills [ ]; Weakness [ ]  Cardiac: Chest pain/pressure [ ]; Resting SOB [ ]; Exertional SOB [ ]; Orthopnea [ ]; Pedal Edema [ ]; Palpitations [ ]; Syncope [ ]; Presyncope [ ]; Paroxysmal nocturnal dyspnea[ ]  Pulmonary: Cough [ ]; Wheezing[ ]; Hemoptysis[ ]; Sputum [ ]; Snoring [ ]  GI: Vomiting[ ]; Dysphagia[ ]; Melena[ ]; Hematochezia [ ]; Heartburn[ ]; Abdominal pain [ ]; Constipation [ ]; Diarrhea [ ]; BRBPR [ ]  GU: Hematuria[ ]; Dysuria [ ]; Nocturia[ ]  Vascular: Pain in legs with walking [ ]; Pain in feet with lying flat [ ]; Non-healing sores [ ]; Stroke [ ]; TIA [ ]; Slurred speech [ ];  Neuro: Headaches[ ]; Vertigo[ ]; Seizures[ ]; Paresthesias[ ];Blurred vision [ ]; Diplopia [ ]; Vision changes [ ]  Ortho/Skin: Arthritis [ ]; Joint pain [ ]; Muscle pain [ ]; Joint swelling [ ]; Back Pain [ ]; Rash [ ]  Psych: Depression[ ]; Anxiety[ ]  Heme: Bleeding problems [ ]; Clotting disorders [ ]; Anemia [ ]  Endocrine: Diabetes [ ]; Thyroid dysfunction[ ]   Past Medical History:  Diagnosis Date  . Anemia   . Breast mass 03/21/2016   left 7:00  . Cancer (  [place])    left brest cancer   . Genetic testing 06/03/2016   Ms. Carrigg underwent genetic counseling and testing for hereditary cancer syndromes on 05/15/2016. Her results were negative for mutations in all 46 genes analyzed by [facility]'s 46-gene Common Hereditary Cancers Panel. Genes analyzed include: APC, ATM, AXIN2, BARD1, BMPR1A, BRCA1, BRCA2, BRIP1, CDH1, CDKN2A, CHEK2, CTNNA1, DICER1, EPCAM, GREM1, HOXB13, KIT, MEN1, MLH1, MSH2, MSH3, MSH6, MUTYH, NBN,    Current Outpatient Prescriptions  Medication Sig Dispense Refill  . cycloSPORINE (RESTASIS) 0.05 % ophthalmic  emulsion Place 1 drop into both eyes 2 (two) times daily. 1 each 1  . lidocaine-prilocaine (EMLA) cream Apply to affected area once 30 g 3  . LORazepam (ATIVAN) 0.5 MG tablet Take 1 tablet (0.5 mg total) by mouth every 6 (six) hours as needed (Nausea or vomiting). 30 tablet 0  . meloxicam (MOBIC) 15 MG tablet Take 1 tablet (15 mg total) by mouth 2 (two) times daily. 60 tablet 3  . Multiple Vitamins-Minerals (WOMENS MULTI VITAMIN & MINERAL) TABS Take 1 tablet by mouth daily after breakfast. 30 tablet 3  . ondansetron (ZOFRAN) 8 MG tablet Take 1 tablet (8 mg total) by mouth 2 (two) times daily as needed. Start on the third day after chemotherapy. 30 tablet 1  . prochlorperazine (COMPAZINE) 10 MG tablet Take 1 tablet (10 mg total) by mouth every 6 (six) hours as needed (Nausea or vomiting). 30 tablet 1   No current facility-administered medications for this encounter.     No Known Allergies    Social History   Social History  . Marital status: Single    Spouse name: N/A  . Number of children: N/A  . Years of education: N/A   Occupational History  . Not on file.   Social History Main Topics  . Smoking status: Never Smoker  . Smokeless tobacco: Never Used  . Alcohol use Yes     Comment: wine/social  . Drug use: No  . Sexual activity: Yes    Birth control/ protection: Surgical   Other Topics Concern  . Not on file   Social History Narrative  . No narrative on file      Family History  Problem Relation Age of Onset  . Hypertension Mother   . Diabetes Mother   . Breast cancer Paternal Grandmother 10       d.62 bilateral breast cancer    Vitals:   07/16/16 1008  BP: (!) 145/99  Pulse: 70  SpO2: 99%  Weight: 206 lb 12.8 oz (93.8 kg)    PHYSICAL EXAM: General:  Well appearing. No respiratory difficulty HEENT: normal Neck: supple. no JVD. Carotids 2+ bilat; no bruits. No lymphadenopathy or thryomegaly appreciated. R port-a-cath Cor: PMI nondisplaced. Regular rate &  rhythm. No rubs, gallops or murmurs. Lungs: clear Abdomen: obese soft, nontender, nondistended. No hepatosplenomegaly. No bruits or masses. Good bowel sounds. Extremities: no cyanosis, clubbing, rash, edema Neuro: alert & oriented x 3, cranial nerves grossly intact. moves all 4 extremities w/o difficulty. Affect pleasant.  ASSESSMENT & PLAN: 1. Malignant neoplasm of lower-inner quadrant of left breast in female, estrogen receptor negative ([facility]) - ER 0%, PR 0%, HER-2 negative ratio 1.42, Ki-67 90%; palpable lump 3.5 x 2.6 x 2.2 cm and 2.8 Center left axillary lymph node, T2 N1 stage IIB - Has completed Neoadjuvant chemotherapy with Adriamycin and Cytoxan dose dense 4 - Now receivingTaxolweekly 12 - Pending breast conserving surgery with sentinel lymph node study vs targeted axillary dissection. Followed  by adjuvant radiation therapy - I reviewed echos personally. EF and Doppler parameters stable. No evidence adriamycin cardiotoxicity or HF on exam. Will see back in 6 months for repeat echo to ensure stability. I explained incidence of Adriamycin cardiotoxicity in detail include small possibility of very delayed toxicity.    2. HTN -BP elevated here but has been normal at all Oncology visits. With diastolic dysfunction on echo watch carefully for high BP.   Glori Bickers, MD  10:22 AM

## 2016-07-16 NOTE — Patient Instructions (Signed)
Follow up and Echo with Dr.Bensimhon in 6 months.  

## 2016-07-16 NOTE — Progress Notes (Signed)
Advanced Heart Failure Medication Review by a Pharmacist  Does the patient  feel that his/her medications are working for him/her?  yes  Has the patient been experiencing any side effects to the medications prescribed?  no  Does the patient measure his/her own blood pressure or blood glucose at home?  no   Does the patient have any problems obtaining medications due to transportation or finances?   no  Understanding of regimen: excellent Understanding of indications: excellent Potential of compliance: excellent Patient understands to avoid NSAIDs. Patient understands to avoid decongestants.  Issues to address at subsequent visits: high BP, isolated   Pharmacist comments: Susan Morrison presents in good spirits with her Aunt. She did not bring Rx bottles but has good understanding. No issues with medications. Has not needed compazine, ondansetron is controlling n/v well. Currently undergoing chemotherapy treatment with Adriamycin, Cytoxan, and Taxol. Also getting Zoladex. No questions or concerns at this time.   Carlean Jews, Pharm.D. PGY1 Pharmacy Resident 6/12/201810:39 AM Pager [phone]     Time with patient: 15 Preparation and documentation time: 3 Total time: 18 mins

## 2016-07-16 NOTE — Progress Notes (Signed)
  Echocardiogram 2D Echocardiogram has been performed.  Susan Morrison 07/16/2016, 10:13 AM

## 2016-07-17 ENCOUNTER — Encounter: Payer: Self-pay | Admitting: Hematology and Oncology

## 2016-07-17 ENCOUNTER — Other Ambulatory Visit: Payer: Self-pay | Admitting: Urology

## 2016-07-17 ENCOUNTER — Telehealth: Payer: Self-pay

## 2016-07-17 ENCOUNTER — Ambulatory Visit: Payer: Medicaid Other

## 2016-07-17 ENCOUNTER — Other Ambulatory Visit (HOSPITAL_BASED_OUTPATIENT_CLINIC_OR_DEPARTMENT_OTHER): Payer: Medicaid Other

## 2016-07-17 ENCOUNTER — Ambulatory Visit (HOSPITAL_BASED_OUTPATIENT_CLINIC_OR_DEPARTMENT_OTHER): Payer: Medicaid Other | Admitting: Hematology and Oncology

## 2016-07-17 ENCOUNTER — Ambulatory Visit (HOSPITAL_BASED_OUTPATIENT_CLINIC_OR_DEPARTMENT_OTHER): Payer: Medicaid Other

## 2016-07-17 DIAGNOSIS — Q6211 Congenital occlusion of ureteropelvic junction: Principal | ICD-10-CM

## 2016-07-17 DIAGNOSIS — C773 Secondary and unspecified malignant neoplasm of axilla and upper limb lymph nodes: Secondary | ICD-10-CM

## 2016-07-17 DIAGNOSIS — Z171 Estrogen receptor negative status [ER-]: Secondary | ICD-10-CM | POA: Diagnosis not present

## 2016-07-17 DIAGNOSIS — G620 Drug-induced polyneuropathy: Secondary | ICD-10-CM

## 2016-07-17 DIAGNOSIS — Z95828 Presence of other vascular implants and grafts: Secondary | ICD-10-CM

## 2016-07-17 DIAGNOSIS — R238 Other skin changes: Secondary | ICD-10-CM | POA: Diagnosis not present

## 2016-07-17 DIAGNOSIS — D500 Iron deficiency anemia secondary to blood loss (chronic): Secondary | ICD-10-CM

## 2016-07-17 DIAGNOSIS — C50312 Malignant neoplasm of lower-inner quadrant of left female breast: Secondary | ICD-10-CM

## 2016-07-17 DIAGNOSIS — Z5111 Encounter for antineoplastic chemotherapy: Secondary | ICD-10-CM | POA: Diagnosis not present

## 2016-07-17 DIAGNOSIS — Q6239 Other obstructive defects of renal pelvis and ureter: Secondary | ICD-10-CM

## 2016-07-17 DIAGNOSIS — R530 Neoplastic (malignant) related fatigue: Secondary | ICD-10-CM | POA: Diagnosis not present

## 2016-07-17 LAB — CBC WITH DIFFERENTIAL/PLATELET
BASO%: 0.9 % (ref 0.0–2.0)
BASOS ABS: 0 10*3/uL (ref 0.0–0.1)
EOS%: 9.1 % — ABNORMAL HIGH (ref 0.0–7.0)
Eosinophils Absolute: 0.4 10*3/uL (ref 0.0–0.5)
HCT: 34.3 % — ABNORMAL LOW (ref 34.8–46.6)
HEMOGLOBIN: 11.4 g/dL — AB (ref 11.6–15.9)
LYMPH#: 0.8 10*3/uL — AB (ref 0.9–3.3)
LYMPH%: 17.7 % (ref 14.0–49.7)
MCH: 25.7 pg (ref 25.1–34.0)
MCHC: 33.2 g/dL (ref 31.5–36.0)
MCV: 77.4 fL — AB (ref 79.5–101.0)
MONO#: 0.3 10*3/uL (ref 0.1–0.9)
MONO%: 7.5 % (ref 0.0–14.0)
NEUT#: 2.9 10*3/uL (ref 1.5–6.5)
NEUT%: 64.8 % (ref 38.4–76.8)
NRBC: 0 % (ref 0–0)
PLATELETS: 244 10*3/uL (ref 145–400)
RBC: 4.43 10*6/uL (ref 3.70–5.45)
RDW: 20.2 % — AB (ref 11.2–14.5)
WBC: 4.5 10*3/uL (ref 3.9–10.3)

## 2016-07-17 LAB — COMPREHENSIVE METABOLIC PANEL
ALBUMIN: 3.3 g/dL — AB (ref 3.5–5.0)
ALT: 48 U/L (ref 0–55)
ANION GAP: 9 meq/L (ref 3–11)
AST: 38 U/L — ABNORMAL HIGH (ref 5–34)
Alkaline Phosphatase: 92 U/L (ref 40–150)
BILIRUBIN TOTAL: 1.03 mg/dL (ref 0.20–1.20)
BUN: 9.1 mg/dL (ref 7.0–26.0)
CO2: 25 mEq/L (ref 22–29)
Calcium: 9.8 mg/dL (ref 8.4–10.4)
Chloride: 107 mEq/L (ref 98–109)
Creatinine: 0.8 mg/dL (ref 0.6–1.1)
Glucose: 99 mg/dl (ref 70–140)
Potassium: 3.9 mEq/L (ref 3.5–5.1)
Sodium: 141 mEq/L (ref 136–145)
TOTAL PROTEIN: 6.8 g/dL (ref 6.4–8.3)

## 2016-07-17 MED ORDER — SODIUM CHLORIDE 0.9% FLUSH
10.0000 mL | Freq: Once | INTRAVENOUS | Status: AC
  Administered 2016-07-17: 10 mL
  Filled 2016-07-17: qty 10

## 2016-07-17 MED ORDER — SODIUM CHLORIDE 0.9 % IV SOLN
Freq: Once | INTRAVENOUS | Status: AC
Start: 2016-07-17 — End: 2016-07-17
  Administered 2016-07-17: 10:00:00 via INTRAVENOUS

## 2016-07-17 MED ORDER — DIPHENHYDRAMINE HCL 50 MG/ML IJ SOLN
50.0000 mg | Freq: Once | INTRAMUSCULAR | Status: AC
  Administered 2016-07-17: 50 mg via INTRAVENOUS

## 2016-07-17 MED ORDER — PACLITAXEL CHEMO INJECTION 300 MG/50ML
65.0000 mg/m2 | Freq: Once | INTRAVENOUS | Status: AC
  Administered 2016-07-17: 138 mg via INTRAVENOUS
  Filled 2016-07-17: qty 23

## 2016-07-17 MED ORDER — HEPARIN SOD (PORK) LOCK FLUSH 100 UNIT/ML IV SOLN
500.0000 [IU] | Freq: Once | INTRAVENOUS | Status: AC | PRN
  Administered 2016-07-17: 500 [IU]
  Filled 2016-07-17: qty 5

## 2016-07-17 MED ORDER — SODIUM CHLORIDE 0.9% FLUSH
10.0000 mL | INTRAVENOUS | Status: DC | PRN
  Administered 2016-07-17: 10 mL
  Filled 2016-07-17: qty 10

## 2016-07-17 MED ORDER — FAMOTIDINE IN NACL 20-0.9 MG/50ML-% IV SOLN
INTRAVENOUS | Status: AC
  Filled 2016-07-17: qty 50

## 2016-07-17 MED ORDER — SODIUM CHLORIDE 0.9 % IV SOLN
20.0000 mg | Freq: Once | INTRAVENOUS | Status: AC
  Administered 2016-07-17: 20 mg via INTRAVENOUS
  Filled 2016-07-17: qty 2

## 2016-07-17 MED ORDER — FAMOTIDINE IN NACL 20-0.9 MG/50ML-% IV SOLN
20.0000 mg | Freq: Once | INTRAVENOUS | Status: AC
  Administered 2016-07-17: 20 mg via INTRAVENOUS

## 2016-07-17 MED ORDER — DIPHENHYDRAMINE HCL 50 MG/ML IJ SOLN
INTRAMUSCULAR | Status: AC
  Filled 2016-07-17: qty 1

## 2016-07-17 NOTE — Patient Instructions (Signed)
[facility] Discharge Instructions for Patients Receiving Chemotherapy  Today you received the following chemotherapy agents:  Taxol  To help prevent nausea and vomiting after your treatment, we encourage you to take your nausea medication as prescribed.   If you develop nausea and vomiting that is not controlled by your nausea medication, call the clinic.   BELOW ARE SYMPTOMS THAT SHOULD BE REPORTED IMMEDIATELY:  *FEVER GREATER THAN 100.5 F  *CHILLS WITH OR WITHOUT FEVER  NAUSEA AND VOMITING THAT IS NOT CONTROLLED WITH YOUR NAUSEA MEDICATION  *UNUSUAL SHORTNESS OF BREATH  *UNUSUAL BRUISING OR BLEEDING  TENDERNESS IN MOUTH AND THROAT WITH OR WITHOUT PRESENCE OF ULCERS  *URINARY PROBLEMS  *BOWEL PROBLEMS  UNUSUAL RASH Items with * indicate a potential emergency and should be followed up as soon as possible.  Feel free to call the clinic you have any questions or concerns. The clinic phone number is [phone].  Please show the CHEMO ALERT CARD at check-in to the Emergency Department and triage nurse.   

## 2016-07-17 NOTE — Telephone Encounter (Signed)
Error entry

## 2016-07-17 NOTE — Progress Notes (Signed)
Patient Care Team: Nche, Charlene Brooke, NP as PCP - General (Internal Medicine) Alphonsa Overall, MD as Consulting Physician (General Surgery) Nicholas Lose, MD as Consulting Physician (Hematology and Oncology) Eppie Gibson, MD as Attending Physician (Radiation Oncology) Gardenia Phlegm, NP as Nurse Practitioner (Hematology and Oncology)  DIAGNOSIS:  Encounter Diagnosis  Name Primary?  . Malignant neoplasm of lower-inner quadrant of left breast in female, estrogen receptor negative ([place])     SUMMARY OF ONCOLOGIC HISTORY:   Malignant neoplasm of lower-inner quadrant of left breast in female, estrogen receptor negative ([place])   03/22/2016 Initial Diagnosis    Left breast biopsy 8:00: IDC high-grade with necrosis, left axillary lymph node biopsy IDC, ER 0%, PR 0%, HER-2 negative ratio 1.42, Ki-67 90%; palpable lump 3.5 x 2.6 x 2.2 cm and 2.8 Center left axillary lymph node, T2 N1 stage IIB      04/10/2016 Initial Biopsy    Additional biopsies left breast 9:30: Sclerosed intraductal papilloma with every 3 hours, right breast 1:00: Complex sclerosing lesion with UDH, right breast 8:00: Complex sclerosing lesion with UDH      04/17/2016 -  Neo-Adjuvant Chemotherapy    Dose dense Adriamycin and Cytoxan 4 followed by Taxol weekly 12      05/15/2016 Genetic Testing    Genetic counseling and testing for hereditary cancer syndromes performed on 05/15/2016. Results are negative for pathogenic mutations in 46 genes analyzed by [employer]'s Common Hereditary Cancers Panel. Results are dated 05/24/2016. Genes tested: APC, ATM, AXIN2, BARD1, BMPR1A, BRCA1, BRCA2, BRIP1, CDH1, CDKN2A, CHEK2, CTNNA1, DICER1, EPCAM, GREM1, HOXB13, KIT, MEN1, MLH1, MSH2, MSH3, MSH6, MUTYH, NBN, NF1, NTHL1, PALB2, PDGFRA, PMS2, POLD1, POLE, PTEN, RAD50, RAD51C, RAD51D, SDHA, SDHB, SDHC, SDHD, SMAD4, SMARCA4, STK11, TP53, TSC1, TSC2, and VHL.  A variant of uncertain significance (not clinically actionable) was noted in  MUTYH.          CHIEF COMPLIANT: Cycle 6 Taxol  INTERVAL HISTORY: Susan Morrison is a 46 year old with above-mentioned history of left breast cancer currently on neoadjuvant chemotherapy and today is cycle 6 of Taxol. Overall she's tolerating Taxol fairly well. She complains of fatigue. Denies any nausea vomiting. Occasional neuropathy symptoms.  REVIEW OF SYSTEMS:   Constitutional: Denies fevers, chills or abnormal weight loss Eyes: Denies blurriness of vision Ears, nose, mouth, throat, and face: Denies mucositis or sore throat Respiratory: Denies cough, dyspnea or wheezes Cardiovascular: Denies palpitation, chest discomfort Gastrointestinal:  Denies nausea, heartburn or change in bowel habits Skin: Denies abnormal skin rashes Lymphatics: Denies new lymphadenopathy or easy bruising Neurological: Mild peripheral neuropathy Behavioral/Psych: Mood is stable, no new changes  Extremities: No lower extremity edema  All other systems were reviewed with the patient and are negative.  I have reviewed the past medical history, past surgical history, social history and family history with the patient and they are unchanged from previous note.  ALLERGIES:  has No Known Allergies.  MEDICATIONS:  Current Outpatient Prescriptions  Medication Sig Dispense Refill  . cycloSPORINE (RESTASIS) 0.05 % ophthalmic emulsion Place 1 drop into both eyes 2 (two) times daily. 1 each 1  . lidocaine-prilocaine (EMLA) cream Apply to affected area once 30 g 3  . LORazepam (ATIVAN) 0.5 MG tablet Take 1 tablet (0.5 mg total) by mouth every 6 (six) hours as needed (Nausea or vomiting). 30 tablet 0  . meloxicam (MOBIC) 15 MG tablet Take 1 tablet (15 mg total) by mouth 2 (two) times daily. 60 tablet 3  . Multiple Vitamins-Minerals (WOMENS MULTI VITAMIN &  MINERAL) TABS Take 1 tablet by mouth daily after breakfast. 30 tablet 3  . ondansetron (ZOFRAN) 8 MG tablet Take 1 tablet (8 mg total) by mouth 2 (two) times  daily as needed. Start on the third day after chemotherapy. 30 tablet 1  . prochlorperazine (COMPAZINE) 10 MG tablet Take 1 tablet (10 mg total) by mouth every 6 (six) hours as needed (Nausea or vomiting). 30 tablet 1   No current facility-administered medications for this visit.     PHYSICAL EXAMINATION: ECOG PERFORMANCE STATUS: 1 - Symptomatic but completely ambulatory  Vitals:   07/17/16 0906  BP: 129/89  Pulse: 71  Resp: 18  Temp: 98.2 F (36.8 C)   Filed Weights   07/17/16 0906  Weight: 205 lb 4.8 oz (93.1 kg)    GENERAL:alert, no distress and comfortable SKIN: skin color, texture, turgor are normal, no rashes or significant lesions EYES: normal, Conjunctiva are pink and non-injected, sclera clear OROPHARYNX:no exudate, no erythema and lips, buccal mucosa, and tongue normal  NECK: supple, thyroid normal size, non-tender, without nodularity LYMPH:  no palpable lymphadenopathy in the cervical, axillary or inguinal LUNGS: clear to auscultation and percussion with normal breathing effort HEART: regular rate & rhythm and no murmurs and no lower extremity edema ABDOMEN:abdomen soft, non-tender and normal bowel sounds MUSCULOSKELETAL:no cyanosis of digits and no clubbing  NEURO: alert & oriented x 3 with fluent speech, no focal motor/sensory deficits EXTREMITIES: No lower extremity edema  LABORATORY DATA:  I have reviewed the data as listed   Chemistry      Component Value Date/Time   NA 141 07/17/2016 0751   K 3.9 07/17/2016 0751   CL 108 05/15/2016 1147   CO2 25 07/17/2016 0751   BUN 9.1 07/17/2016 0751   CREATININE 0.8 07/17/2016 0751      Component Value Date/Time   CALCIUM 9.8 07/17/2016 0751   ALKPHOS 92 07/17/2016 0751   AST 38 (H) 07/17/2016 0751   ALT 48 07/17/2016 0751   BILITOT 1.03 07/17/2016 0751       Lab Results  Component Value Date   WBC 4.5 07/17/2016   HGB 11.4 (L) 07/17/2016   HCT 34.3 (L) 07/17/2016   MCV 77.4 (L) 07/17/2016   PLT 244  07/17/2016   NEUTROABS 2.9 07/17/2016    ASSESSMENT & PLAN:  Malignant neoplasm of lower-inner quadrant of left breast in female, estrogen receptor negative ([place]) 03/22/2016: Left breast biopsy 8:00: IDC high-grade with necrosis, left axillary lymph node biopsy IDC, ER 0%, PR 0%, HER-2 negative ratio 1.42, Ki-67 90%; palpable lump 3.5 x 2.6 x 2.2 cm and 2.8 Center left axillary lymph node, T2 N1 stage IIB  Treatment plan based on multidisciplinary tumor board: 1. Neoadjuvant chemotherapy with Adriamycin and Cytoxan dose dense 4 followed by Taxolweekly 12 2. Followed by breast conserving surgery with sentinel lymph node study vs targeted axillary dissection 3. Followed by adjuvant radiation therapy --------------------------------------------------------------------------------------------------------------------------------------------- Current treatment: Completed 4 cycles of dose dense Adriamycin Cytoxan, today is cycle 6Taxol Echocardiogram 04/09/2016: EF 55-60% Closely monitoring for chemotherapy toxicities.  Chemotherapy toxicities: Chemotherapy-induced peripheral neuropathy: I decreased the dosage of Taxol with cycle 6. Chemotherapy-induced nail changes Fatigue  Microcytic anemia: Status post iron infusion She has had bleeding problems from the uterus. On Zoladex injection last given in the end of April  Renal issues with the left hydronephrosis and chronic ureteropelvic obstruction: Referredto urology.They recommended watchful waiting and monitoring every 3 months.  Return to clinic in 2weeksfor cycle 8Taxol   I  spent 25 minutes talking to the patient of which more than half was spent in counseling and coordination of care.  No orders of the defined types were placed in this encounter.  The patient has a good understanding of the overall plan. she agrees with it. she will call with any problems that may develop before the next visit here.   Gudena, Vinay K,  MD 07/17/16    

## 2016-07-24 ENCOUNTER — Ambulatory Visit: Payer: Medicaid Other

## 2016-07-24 ENCOUNTER — Ambulatory Visit (HOSPITAL_BASED_OUTPATIENT_CLINIC_OR_DEPARTMENT_OTHER): Payer: Medicaid Other

## 2016-07-24 ENCOUNTER — Other Ambulatory Visit (HOSPITAL_BASED_OUTPATIENT_CLINIC_OR_DEPARTMENT_OTHER): Payer: Medicaid Other

## 2016-07-24 VITALS — BP 146/84 | HR 63 | Temp 98.1°F | Resp 18

## 2016-07-24 DIAGNOSIS — Z95828 Presence of other vascular implants and grafts: Secondary | ICD-10-CM

## 2016-07-24 DIAGNOSIS — D500 Iron deficiency anemia secondary to blood loss (chronic): Secondary | ICD-10-CM

## 2016-07-24 DIAGNOSIS — C773 Secondary and unspecified malignant neoplasm of axilla and upper limb lymph nodes: Secondary | ICD-10-CM

## 2016-07-24 DIAGNOSIS — Z5111 Encounter for antineoplastic chemotherapy: Secondary | ICD-10-CM

## 2016-07-24 DIAGNOSIS — Z171 Estrogen receptor negative status [ER-]: Principal | ICD-10-CM

## 2016-07-24 DIAGNOSIS — C50312 Malignant neoplasm of lower-inner quadrant of left female breast: Secondary | ICD-10-CM | POA: Diagnosis not present

## 2016-07-24 LAB — CBC WITH DIFFERENTIAL/PLATELET
BASO%: 1.1 % (ref 0.0–2.0)
BASOS ABS: 0 10*3/uL (ref 0.0–0.1)
EOS ABS: 0.3 10*3/uL (ref 0.0–0.5)
EOS%: 7 % (ref 0.0–7.0)
HCT: 35.6 % (ref 34.8–46.6)
HEMOGLOBIN: 11.7 g/dL (ref 11.6–15.9)
LYMPH%: 19.4 % (ref 14.0–49.7)
MCH: 25.6 pg (ref 25.1–34.0)
MCHC: 33 g/dL (ref 31.5–36.0)
MCV: 77.7 fL — AB (ref 79.5–101.0)
MONO#: 0.5 10*3/uL (ref 0.1–0.9)
MONO%: 12.5 % (ref 0.0–14.0)
NEUT#: 2.5 10*3/uL (ref 1.5–6.5)
NEUT%: 60 % (ref 38.4–76.8)
Platelets: 241 10*3/uL (ref 145–400)
RBC: 4.58 10*6/uL (ref 3.70–5.45)
RDW: 20.9 % — AB (ref 11.2–14.5)
WBC: 4.2 10*3/uL (ref 3.9–10.3)
lymph#: 0.8 10*3/uL — ABNORMAL LOW (ref 0.9–3.3)

## 2016-07-24 LAB — COMPREHENSIVE METABOLIC PANEL
ALBUMIN: 3.3 g/dL — AB (ref 3.5–5.0)
ALK PHOS: 88 U/L (ref 40–150)
ALT: 48 U/L (ref 0–55)
AST: 39 U/L — ABNORMAL HIGH (ref 5–34)
Anion Gap: 12 mEq/L — ABNORMAL HIGH (ref 3–11)
BUN: 11.4 mg/dL (ref 7.0–26.0)
CO2: 23 mEq/L (ref 22–29)
Calcium: 9.4 mg/dL (ref 8.4–10.4)
Chloride: 108 mEq/L (ref 98–109)
Creatinine: 0.7 mg/dL (ref 0.6–1.1)
GLUCOSE: 92 mg/dL (ref 70–140)
POTASSIUM: 4 meq/L (ref 3.5–5.1)
SODIUM: 143 meq/L (ref 136–145)
Total Bilirubin: 0.67 mg/dL (ref 0.20–1.20)
Total Protein: 6.8 g/dL (ref 6.4–8.3)

## 2016-07-24 MED ORDER — GOSERELIN ACETATE 3.6 MG ~~LOC~~ IMPL
3.6000 mg | DRUG_IMPLANT | Freq: Once | SUBCUTANEOUS | Status: AC
  Administered 2016-07-24: 3.6 mg via SUBCUTANEOUS
  Filled 2016-07-24: qty 3.6

## 2016-07-24 MED ORDER — DIPHENHYDRAMINE HCL 50 MG/ML IJ SOLN
50.0000 mg | Freq: Once | INTRAMUSCULAR | Status: AC
  Administered 2016-07-24: 50 mg via INTRAVENOUS

## 2016-07-24 MED ORDER — SODIUM CHLORIDE 0.9% FLUSH
10.0000 mL | Freq: Once | INTRAVENOUS | Status: AC
  Administered 2016-07-24: 10 mL
  Filled 2016-07-24: qty 10

## 2016-07-24 MED ORDER — SODIUM CHLORIDE 0.9% FLUSH
10.0000 mL | INTRAVENOUS | Status: DC | PRN
  Administered 2016-07-24: 10 mL
  Filled 2016-07-24: qty 10

## 2016-07-24 MED ORDER — PACLITAXEL CHEMO INJECTION 300 MG/50ML
65.0000 mg/m2 | Freq: Once | INTRAVENOUS | Status: AC
  Administered 2016-07-24: 138 mg via INTRAVENOUS
  Filled 2016-07-24: qty 23

## 2016-07-24 MED ORDER — SODIUM CHLORIDE 0.9 % IV SOLN
Freq: Once | INTRAVENOUS | Status: AC
  Administered 2016-07-24: 09:00:00 via INTRAVENOUS

## 2016-07-24 MED ORDER — FAMOTIDINE IN NACL 20-0.9 MG/50ML-% IV SOLN
20.0000 mg | Freq: Once | INTRAVENOUS | Status: AC
  Administered 2016-07-24: 20 mg via INTRAVENOUS

## 2016-07-24 MED ORDER — SODIUM CHLORIDE 0.9 % IV SOLN
20.0000 mg | Freq: Once | INTRAVENOUS | Status: AC
  Administered 2016-07-24: 20 mg via INTRAVENOUS
  Filled 2016-07-24: qty 2

## 2016-07-24 MED ORDER — DIPHENHYDRAMINE HCL 50 MG/ML IJ SOLN
INTRAMUSCULAR | Status: AC
  Filled 2016-07-24: qty 1

## 2016-07-24 MED ORDER — HEPARIN SOD (PORK) LOCK FLUSH 100 UNIT/ML IV SOLN
500.0000 [IU] | Freq: Once | INTRAVENOUS | Status: AC | PRN
  Administered 2016-07-24: 500 [IU]
  Filled 2016-07-24: qty 5

## 2016-07-24 MED ORDER — FAMOTIDINE IN NACL 20-0.9 MG/50ML-% IV SOLN
INTRAVENOUS | Status: AC
  Filled 2016-07-24: qty 50

## 2016-07-24 NOTE — Patient Instructions (Signed)
[facility] Discharge Instructions for Patients Receiving Chemotherapy  Today you received the following chemotherapy agents:  Taxol  To help prevent nausea and vomiting after your treatment, we encourage you to take your nausea medication as prescribed.   If you develop nausea and vomiting that is not controlled by your nausea medication, call the clinic.   BELOW ARE SYMPTOMS THAT SHOULD BE REPORTED IMMEDIATELY:  *FEVER GREATER THAN 100.5 F  *CHILLS WITH OR WITHOUT FEVER  NAUSEA AND VOMITING THAT IS NOT CONTROLLED WITH YOUR NAUSEA MEDICATION  *UNUSUAL SHORTNESS OF BREATH  *UNUSUAL BRUISING OR BLEEDING  TENDERNESS IN MOUTH AND THROAT WITH OR WITHOUT PRESENCE OF ULCERS  *URINARY PROBLEMS  *BOWEL PROBLEMS  UNUSUAL RASH Items with * indicate a potential emergency and should be followed up as soon as possible.  Feel free to call the clinic you have any questions or concerns. The clinic phone number is [phone].  Please show the CHEMO ALERT CARD at check-in to the Emergency Department and triage nurse.   

## 2016-07-24 NOTE — Patient Instructions (Signed)

## 2016-07-30 NOTE — Assessment & Plan Note (Signed)
03/22/2016: Left breast biopsy 8:00: IDC high-grade with necrosis, left axillary lymph node biopsy IDC, ER 0%, PR 0%, HER-2 negative ratio 1.42, Ki-67 90%; palpable lump 3.5 x 2.6 x 2.2 cm and 2.8 Center left axillary lymph node, T2 N1 stage IIB  Treatment plan based on multidisciplinary tumor board: 1. Neoadjuvant chemotherapy with Adriamycin and Cytoxan dose dense 4 followed by Taxolweekly 12 2. Followed by breast conserving surgery with sentinel lymph node study vs targeted axillary dissection 3. Followed by adjuvant radiation therapy --------------------------------------------------------------------------------------------------------------------------------------------- Current treatment: Completed 4 cycles of dose dense Adriamycin Cytoxan, today is cycle 6Taxol Echocardiogram 04/09/2016: EF 55-60% Closely monitoring for chemotherapy toxicities.  Chemotherapy toxicities: Chemotherapy-induced peripheral neuropathy: I decreased the dosage of Taxol with cycle 8. Chemotherapy-induced nail changes Fatigue  Microcytic anemia: Status post iron infusion She has had bleeding problems from the uterus. OnZoladex injection last given in the end of April  Renal issues with the left hydronephrosisand chronic ureteropelvic obstruction: Referredto urology.They recommended watchful waiting and monitoring every 3 months.  Return to clinic in 2weeksfor cycle 10Taxol

## 2016-07-31 ENCOUNTER — Ambulatory Visit (HOSPITAL_BASED_OUTPATIENT_CLINIC_OR_DEPARTMENT_OTHER): Payer: Medicaid Other

## 2016-07-31 ENCOUNTER — Encounter: Payer: Self-pay | Admitting: *Deleted

## 2016-07-31 ENCOUNTER — Other Ambulatory Visit: Payer: Self-pay

## 2016-07-31 ENCOUNTER — Ambulatory Visit (HOSPITAL_BASED_OUTPATIENT_CLINIC_OR_DEPARTMENT_OTHER): Payer: Medicaid Other | Admitting: Hematology and Oncology

## 2016-07-31 ENCOUNTER — Ambulatory Visit: Payer: Medicaid Other

## 2016-07-31 ENCOUNTER — Encounter: Payer: Self-pay | Admitting: Hematology and Oncology

## 2016-07-31 ENCOUNTER — Other Ambulatory Visit (HOSPITAL_BASED_OUTPATIENT_CLINIC_OR_DEPARTMENT_OTHER): Payer: Medicaid Other

## 2016-07-31 DIAGNOSIS — S71159A Open bite, unspecified thigh, initial encounter: Secondary | ICD-10-CM | POA: Diagnosis not present

## 2016-07-31 DIAGNOSIS — G629 Polyneuropathy, unspecified: Secondary | ICD-10-CM | POA: Diagnosis not present

## 2016-07-31 DIAGNOSIS — C773 Secondary and unspecified malignant neoplasm of axilla and upper limb lymph nodes: Secondary | ICD-10-CM | POA: Diagnosis not present

## 2016-07-31 DIAGNOSIS — Z5111 Encounter for antineoplastic chemotherapy: Secondary | ICD-10-CM | POA: Diagnosis present

## 2016-07-31 DIAGNOSIS — R5383 Other fatigue: Secondary | ICD-10-CM | POA: Diagnosis not present

## 2016-07-31 DIAGNOSIS — D509 Iron deficiency anemia, unspecified: Secondary | ICD-10-CM | POA: Diagnosis not present

## 2016-07-31 DIAGNOSIS — Z171 Estrogen receptor negative status [ER-]: Secondary | ICD-10-CM

## 2016-07-31 DIAGNOSIS — C50312 Malignant neoplasm of lower-inner quadrant of left female breast: Secondary | ICD-10-CM

## 2016-07-31 DIAGNOSIS — N130 Hydronephrosis with ureteropelvic junction obstruction: Secondary | ICD-10-CM

## 2016-07-31 DIAGNOSIS — Z95828 Presence of other vascular implants and grafts: Secondary | ICD-10-CM

## 2016-07-31 DIAGNOSIS — D500 Iron deficiency anemia secondary to blood loss (chronic): Secondary | ICD-10-CM

## 2016-07-31 LAB — CBC WITH DIFFERENTIAL/PLATELET
BASO%: 1.4 % (ref 0.0–2.0)
Basophils Absolute: 0.1 10e3/uL (ref 0.0–0.1)
EOS%: 5.8 % (ref 0.0–7.0)
Eosinophils Absolute: 0.3 10e3/uL (ref 0.0–0.5)
HCT: 37.6 % (ref 34.8–46.6)
HGB: 12.4 g/dL (ref 11.6–15.9)
LYMPH%: 18.7 % (ref 14.0–49.7)
MCH: 25.8 pg (ref 25.1–34.0)
MCHC: 32.9 g/dL (ref 31.5–36.0)
MCV: 78.4 fL — ABNORMAL LOW (ref 79.5–101.0)
MONO#: 0.5 10e3/uL (ref 0.1–0.9)
MONO%: 10.8 % (ref 0.0–14.0)
NEUT#: 2.7 10e3/uL (ref 1.5–6.5)
NEUT%: 63.3 % (ref 38.4–76.8)
Platelets: 246 10e3/uL (ref 145–400)
RBC: 4.79 10e6/uL (ref 3.70–5.45)
RDW: 20.2 % — ABNORMAL HIGH (ref 11.2–14.5)
WBC: 4.3 10e3/uL (ref 3.9–10.3)
lymph#: 0.8 10e3/uL — ABNORMAL LOW (ref 0.9–3.3)

## 2016-07-31 LAB — COMPREHENSIVE METABOLIC PANEL WITH GFR
ALT: 41 U/L (ref 0–55)
AST: 30 U/L (ref 5–34)
Albumin: 3.3 g/dL — ABNORMAL LOW (ref 3.5–5.0)
Alkaline Phosphatase: 81 U/L (ref 40–150)
Anion Gap: 13 meq/L — ABNORMAL HIGH (ref 3–11)
BUN: 6.8 mg/dL — ABNORMAL LOW (ref 7.0–26.0)
CO2: 23 meq/L (ref 22–29)
Calcium: 9.9 mg/dL (ref 8.4–10.4)
Chloride: 107 meq/L (ref 98–109)
Creatinine: 0.7 mg/dL (ref 0.6–1.1)
EGFR: >90 ml/min/1.73 m2
Glucose: 90 mg/dL (ref 70–140)
Potassium: 4 meq/L (ref 3.5–5.1)
Sodium: 143 meq/L (ref 136–145)
Total Bilirubin: 0.78 mg/dL (ref 0.20–1.20)
Total Protein: 7.1 g/dL (ref 6.4–8.3)

## 2016-07-31 MED ORDER — PACLITAXEL CHEMO INJECTION 300 MG/50ML
65.0000 mg/m2 | Freq: Once | INTRAVENOUS | Status: AC
  Administered 2016-07-31: 138 mg via INTRAVENOUS
  Filled 2016-07-31: qty 23

## 2016-07-31 MED ORDER — FAMOTIDINE IN NACL 20-0.9 MG/50ML-% IV SOLN
20.0000 mg | Freq: Once | INTRAVENOUS | Status: AC
  Administered 2016-07-31: 20 mg via INTRAVENOUS

## 2016-07-31 MED ORDER — HEPARIN SOD (PORK) LOCK FLUSH 100 UNIT/ML IV SOLN
500.0000 [IU] | Freq: Once | INTRAVENOUS | Status: AC | PRN
  Administered 2016-07-31: 500 [IU]
  Filled 2016-07-31: qty 5

## 2016-07-31 MED ORDER — SODIUM CHLORIDE 0.9% FLUSH
10.0000 mL | INTRAVENOUS | Status: DC | PRN
  Administered 2016-07-31: 10 mL
  Filled 2016-07-31: qty 10

## 2016-07-31 MED ORDER — SODIUM CHLORIDE 0.9% FLUSH
10.0000 mL | Freq: Once | INTRAVENOUS | Status: AC
  Administered 2016-07-31: 10 mL
  Filled 2016-07-31: qty 10

## 2016-07-31 MED ORDER — DIPHENHYDRAMINE HCL 50 MG/ML IJ SOLN
50.0000 mg | Freq: Once | INTRAMUSCULAR | Status: AC
  Administered 2016-07-31: 50 mg via INTRAVENOUS

## 2016-07-31 MED ORDER — FAMOTIDINE IN NACL 20-0.9 MG/50ML-% IV SOLN
INTRAVENOUS | Status: AC
  Filled 2016-07-31: qty 50

## 2016-07-31 MED ORDER — SODIUM CHLORIDE 0.9 % IV SOLN
Freq: Once | INTRAVENOUS | Status: AC
  Administered 2016-07-31: 10:00:00 via INTRAVENOUS

## 2016-07-31 MED ORDER — CEPHALEXIN 500 MG PO CAPS: 500.0000 mg | ORAL_CAPSULE | Freq: Two times a day (BID) | ORAL | 0 refills | Status: DC

## 2016-07-31 MED ORDER — DIPHENHYDRAMINE HCL 50 MG/ML IJ SOLN
INTRAMUSCULAR | Status: AC
Start: 2016-07-31 — End: 2016-07-31
  Filled 2016-07-31: qty 1

## 2016-07-31 MED ORDER — SODIUM CHLORIDE 0.9 % IV SOLN
20.0000 mg | Freq: Once | INTRAVENOUS | Status: AC
  Administered 2016-07-31: 20 mg via INTRAVENOUS
  Filled 2016-07-31: qty 2

## 2016-07-31 NOTE — Progress Notes (Signed)
Patient Care Team: Nche, Charlene Brooke, NP as PCP - General (Internal Medicine) Alphonsa Overall, MD as Consulting Physician (General Surgery) Nicholas Lose, MD as Consulting Physician (Hematology and Oncology) Eppie Gibson, MD as Attending Physician (Radiation Oncology) Gardenia Phlegm, NP as Nurse Practitioner (Hematology and Oncology)  DIAGNOSIS:  Encounter Diagnosis  Name Primary?  . Malignant neoplasm of lower-inner quadrant of left breast in female, estrogen receptor negative ([place])     SUMMARY OF ONCOLOGIC HISTORY:   Malignant neoplasm of lower-inner quadrant of left breast in female, estrogen receptor negative ([place])   03/22/2016 Initial Diagnosis    Left breast biopsy 8:00: IDC high-grade with necrosis, left axillary lymph node biopsy IDC, ER 0%, PR 0%, HER-2 negative ratio 1.42, Ki-67 90%; palpable lump 3.5 x 2.6 x 2.2 cm and 2.8 Center left axillary lymph node, T2 N1 stage IIB      04/10/2016 Initial Biopsy    Additional biopsies left breast 9:30: Sclerosed intraductal papilloma with every 3 hours, right breast 1:00: Complex sclerosing lesion with UDH, right breast 8:00: Complex sclerosing lesion with UDH      04/17/2016 -  Neo-Adjuvant Chemotherapy    Dose dense Adriamycin and Cytoxan 4 followed by Taxol weekly 12      05/15/2016 Genetic Testing    Genetic counseling and testing for hereditary cancer syndromes performed on 05/15/2016. Results are negative for pathogenic mutations in 46 genes analyzed by [employer]'s Common Hereditary Cancers Panel. Results are dated 05/24/2016. Genes tested: APC, ATM, AXIN2, BARD1, BMPR1A, BRCA1, BRCA2, BRIP1, CDH1, CDKN2A, CHEK2, CTNNA1, DICER1, EPCAM, GREM1, HOXB13, KIT, MEN1, MLH1, MSH2, MSH3, MSH6, MUTYH, NBN, NF1, NTHL1, PALB2, PDGFRA, PMS2, POLD1, POLE, PTEN, RAD50, RAD51C, RAD51D, SDHA, SDHB, SDHC, SDHD, SMAD4, SMARCA4, STK11, TP53, TSC1, TSC2, and VHL.  A variant of uncertain significance (not clinically actionable) was noted in  MUTYH.          CHIEF COMPLIANT: Cycle 8 Taxol  INTERVAL HISTORY: MARLAINA COBURN is a 46 year old with above-mentioned history of left breast cancer currently on neoadjuvant chemotherapy and today is cycle 8 of Taxol.  we reduced the dosage of Taxol with cycle 6. This is because of the end of neuropathy. The neuropathy appears to be stable. It is primarily the tips of the fingers and toes. Denies any nausea/ vomiting.  REVIEW OF SYSTEMS:   Constitutional: Denies fevers, chills or abnormal weight loss Eyes: Denies blurriness of vision Ears, nose, mouth, throat, and face: Denies mucositis or sore throat Respiratory: Denies cough, dyspnea or wheezes Cardiovascular: Denies palpitation, chest discomfort Gastrointestinal:  Denies nausea, heartburn or change in bowel habits Skin: Denies abnormal skin rashes Lymphatics: Denies new lymphadenopathy or easy bruising Neurological: Neuropathy in hands and feet Behavioral/Psych: Mood is stable, no new changes  Extremities: No lower extremity edema  All other systems were reviewed with the patient and are negative.  I have reviewed the past medical history, past surgical history, social history and family history with the patient and they are unchanged from previous note.  ALLERGIES:  has No Known Allergies.  MEDICATIONS:  Current Outpatient Prescriptions  Medication Sig Dispense Refill  . cephALEXin (KEFLEX) 500 MG capsule Take 1 capsule (500 mg total) by mouth 2 (two) times daily. 14 capsule 0  . cycloSPORINE (RESTASIS) 0.05 % ophthalmic emulsion Place 1 drop into both eyes 2 (two) times daily. 1 each 1  . lidocaine-prilocaine (EMLA) cream Apply to affected area once 30 g 3  . LORazepam (ATIVAN) 0.5 MG tablet Take 1 tablet (0.5  mg total) by mouth every 6 (six) hours as needed (Nausea or vomiting). 30 tablet 0  . meloxicam (MOBIC) 15 MG tablet Take 1 tablet (15 mg total) by mouth 2 (two) times daily. 60 tablet 3  . Multiple  Vitamins-Minerals (WOMENS MULTI VITAMIN & MINERAL) TABS Take 1 tablet by mouth daily after breakfast. 30 tablet 3  . ondansetron (ZOFRAN) 8 MG tablet Take 1 tablet (8 mg total) by mouth 2 (two) times daily as needed. Start on the third day after chemotherapy. 30 tablet 1  . prochlorperazine (COMPAZINE) 10 MG tablet Take 1 tablet (10 mg total) by mouth every 6 (six) hours as needed (Nausea or vomiting). 30 tablet 1   No current facility-administered medications for this visit.    Facility-Administered Medications Ordered in Other Visits  Medication Dose Route Frequency Provider Last Rate Last Dose  . dexamethasone (DECADRON) 20 mg in sodium chloride 0.9 % 50 mL IVPB  20 mg Intravenous Once Nicholas Lose, MD   20 mg at 07/31/16 1016  . heparin lock flush 100 unit/mL  500 Units Intracatheter Once PRN Nicholas Lose, MD      . PACLitaxel (TAXOL) 138 mg in dextrose 5 % 250 mL chemo infusion (</= 40m/m2)  65 mg/m2 (Treatment Plan Recorded) Intravenous Once GNicholas Lose MD      . sodium chloride flush (NS) 0.9 % injection 10 mL  10 mL Intracatheter PRN GNicholas Lose MD        PHYSICAL EXAMINATION: ECOG PERFORMANCE STATUS: 1 - Symptomatic but completely ambulatory  Vitals:   07/31/16 0841  BP: 135/85  Pulse: 73  Resp: 18  Temp: 98.2 F (36.8 C)   Filed Weights   07/31/16 0841  Weight: 208 lb 9.6 oz (94.6 kg)    GENERAL:alert, no distress and comfortable SKIN: skin color, texture, turgor are normal, no rashes or significant lesions EYES: normal, Conjunctiva are pink and non-injected, sclera clear OROPHARYNX:no exudate, no erythema and lips, buccal mucosa, and tongue normal  NECK: supple, thyroid normal size, non-tender, without nodularity LYMPH:  no palpable lymphadenopathy in the cervical, axillary or inguinal LUNGS: clear to auscultation and percussion with normal breathing effort HEART: regular rate & rhythm and no murmurs and no lower extremity edema ABDOMEN:abdomen soft,  non-tender and normal bowel sounds MUSCULOSKELETAL:no cyanosis of digits and no clubbing  NEURO: alert & oriented x 3 with fluent speech, no focal motor/sensory deficits EXTREMITIES: No lower extremity edema  LABORATORY DATA:  I have reviewed the data as listed   Chemistry      Component Value Date/Time   NA 143 07/31/2016 0809   K 4.0 07/31/2016 0809   CL 108 05/15/2016 1147   CO2 23 07/31/2016 0809   BUN 6.8 (L) 07/31/2016 0809   CREATININE 0.7 07/31/2016 0809      Component Value Date/Time   CALCIUM 9.9 07/31/2016 0809   ALKPHOS 81 07/31/2016 0809   AST 30 07/31/2016 0809   ALT 41 07/31/2016 0809   BILITOT 0.78 07/31/2016 0809       Lab Results  Component Value Date   WBC 4.3 07/31/2016   HGB 12.4 07/31/2016   HCT 37.6 07/31/2016   MCV 78.4 (L) 07/31/2016   PLT 246 07/31/2016   NEUTROABS 2.7 07/31/2016    ASSESSMENT & PLAN:  Malignant neoplasm of lower-inner quadrant of left breast in [place], [place] receptor negative [place] 03/22/2016: Left breast biopsy 8:00: IDC high-grade with necrosis, left axillary lymph node biopsy IDC, ER 0%, PR 0%, HER-2 negative ratio  1.42, Ki-67 90%; palpable lump 3.5 x 2.6 x 2.2 cm and 2.8 Center left axillary lymph node, T2 N1 stage IIB  Treatment plan based on multidisciplinary tumor board: 1. Neoadjuvant chemotherapy with Adriamycin and Cytoxan dose dense 4 followed by Taxolweekly 12 2. Followed by breast conserving surgery with sentinel lymph node study vs targeted axillary dissection 3. Followed by adjuvant radiation therapy --------------------------------------------------------------------------------------------------------------------------------------------- Current treatment: Completed 4 cycles of dose dense Adriamycin Cytoxan, today is cycle 8Taxol Echocardiogram 04/09/2016: EF 55-60% Closely monitoring for chemotherapy toxicities.  Chemotherapy toxicities: 1. Chemotherapy-induced peripheral neuropathy: I decreased  the dosage of Taxol with cycle 6 2. Chemotherapy-induced nail changes 3. Fatigue  Microcytic anemia: Status post iron infusion She has had bleeding problems from the uterus. OnZoladex injection last given in the end of April Bug bite on the thigh with erythema: Sent a prescription for Keflex 500 twice a day for 7 days  Renal issues with the left hydronephrosisand chronic ureteropelvic obstruction: Referredto urology.They recommended watchful waiting and monitoring every 3 months.  Return to clinic in 2weeksfor cycle 10Taxol  I spent 25 minutes talking to the patient of which more than half was spent in counseling and coordination of care.  No orders of the defined types were placed in this encounter.  The patient has a good understanding of the overall plan. she agrees with it. she will call with any problems that may develop before the next visit here.   Rulon Eisenmenger, MD 07/31/16

## 2016-07-31 NOTE — Progress Notes (Signed)
Per Dr.Gudena, ordered pt Keflex antibiotic to take x7days. Sent to [employer] today. Pt aware.

## 2016-07-31 NOTE — Patient Instructions (Signed)
[facility] Discharge Instructions for Patients Receiving Chemotherapy  Today you received the following chemotherapy agents Taxol  To help prevent nausea and vomiting after your treatment, we encourage you to take your nausea medication   If you develop nausea and vomiting that is not controlled by your nausea medication, call the clinic.   BELOW ARE SYMPTOMS THAT SHOULD BE REPORTED IMMEDIATELY:  *FEVER GREATER THAN 100.5 F  *CHILLS WITH OR WITHOUT FEVER  NAUSEA AND VOMITING THAT IS NOT CONTROLLED WITH YOUR NAUSEA MEDICATION  *UNUSUAL SHORTNESS OF BREATH  *UNUSUAL BRUISING OR BLEEDING  TENDERNESS IN MOUTH AND THROAT WITH OR WITHOUT PRESENCE OF ULCERS  *URINARY PROBLEMS  *BOWEL PROBLEMS  UNUSUAL RASH Items with * indicate a potential emergency and should be followed up as soon as possible.  Feel free to call the clinic you have any questions or concerns. The clinic phone number is [phone].  Please show the CHEMO ALERT CARD at check-in to the Emergency Department and triage nurse.   

## 2016-08-08 ENCOUNTER — Ambulatory Visit: Payer: Medicaid Other

## 2016-08-08 ENCOUNTER — Other Ambulatory Visit (HOSPITAL_BASED_OUTPATIENT_CLINIC_OR_DEPARTMENT_OTHER): Payer: Medicaid Other

## 2016-08-08 ENCOUNTER — Ambulatory Visit (HOSPITAL_BASED_OUTPATIENT_CLINIC_OR_DEPARTMENT_OTHER): Payer: Medicaid Other

## 2016-08-08 VITALS — BP 127/78 | HR 54 | Temp 97.1°F | Resp 20

## 2016-08-08 DIAGNOSIS — Z171 Estrogen receptor negative status [ER-]: Principal | ICD-10-CM

## 2016-08-08 DIAGNOSIS — C773 Secondary and unspecified malignant neoplasm of axilla and upper limb lymph nodes: Secondary | ICD-10-CM | POA: Diagnosis not present

## 2016-08-08 DIAGNOSIS — C50312 Malignant neoplasm of lower-inner quadrant of left female breast: Secondary | ICD-10-CM

## 2016-08-08 DIAGNOSIS — Z95828 Presence of other vascular implants and grafts: Secondary | ICD-10-CM

## 2016-08-08 DIAGNOSIS — Z5111 Encounter for antineoplastic chemotherapy: Secondary | ICD-10-CM

## 2016-08-08 DIAGNOSIS — D500 Iron deficiency anemia secondary to blood loss (chronic): Secondary | ICD-10-CM

## 2016-08-08 LAB — COMPREHENSIVE METABOLIC PANEL
ALBUMIN: 3.3 g/dL — AB (ref 3.5–5.0)
ALT: 37 U/L (ref 0–55)
ANION GAP: 11 meq/L (ref 3–11)
AST: 35 U/L — ABNORMAL HIGH (ref 5–34)
Alkaline Phosphatase: 74 U/L (ref 40–150)
BILIRUBIN TOTAL: 0.88 mg/dL (ref 0.20–1.20)
BUN: 12.8 mg/dL (ref 7.0–26.0)
CALCIUM: 9.6 mg/dL (ref 8.4–10.4)
CO2: 23 mEq/L (ref 22–29)
Chloride: 107 mEq/L (ref 98–109)
Creatinine: 0.7 mg/dL (ref 0.6–1.1)
EGFR: >90 mL/min/{1.73_m2} (ref >90)
Glucose: 93 mg/dl (ref 70–140)
Potassium: 3.8 mEq/L (ref 3.5–5.1)
Sodium: 141 mEq/L (ref 136–145)
TOTAL PROTEIN: 6.7 g/dL (ref 6.4–8.3)

## 2016-08-08 LAB — CBC WITH DIFFERENTIAL/PLATELET
BASO%: 0.8 % (ref 0.0–2.0)
Basophils Absolute: 0 10*3/uL (ref 0.0–0.1)
EOS%: 2.9 % (ref 0.0–7.0)
Eosinophils Absolute: 0.1 10*3/uL (ref 0.0–0.5)
HEMATOCRIT: 34.7 % — AB (ref 34.8–46.6)
HGB: 11.7 g/dL (ref 11.6–15.9)
LYMPH%: 16.4 % (ref 14.0–49.7)
MCH: 25.7 pg (ref 25.1–34.0)
MCHC: 33.7 g/dL (ref 31.5–36.0)
MCV: 76.1 fL — ABNORMAL LOW (ref 79.5–101.0)
MONO#: 0.7 10*3/uL (ref 0.1–0.9)
MONO%: 13.7 % (ref 0.0–14.0)
NEUT%: 66.2 % (ref 38.4–76.8)
NEUTROS ABS: 3.2 10*3/uL (ref 1.5–6.5)
NRBC: 0 % (ref 0–0)
Platelets: 220 10*3/uL (ref 145–400)
RBC: 4.56 10*6/uL (ref 3.70–5.45)
RDW: 17.9 % — AB (ref 11.2–14.5)
WBC: 4.9 10*3/uL (ref 3.9–10.3)
lymph#: 0.8 10*3/uL — ABNORMAL LOW (ref 0.9–3.3)

## 2016-08-08 MED ORDER — PACLITAXEL CHEMO INJECTION 300 MG/50ML
65.0000 mg/m2 | Freq: Once | INTRAVENOUS | Status: AC
  Administered 2016-08-08: 138 mg via INTRAVENOUS
  Filled 2016-08-08: qty 23

## 2016-08-08 MED ORDER — SODIUM CHLORIDE 0.9 % IV SOLN
20.0000 mg | Freq: Once | INTRAVENOUS | Status: AC
  Administered 2016-08-08: 20 mg via INTRAVENOUS
  Filled 2016-08-08: qty 2

## 2016-08-08 MED ORDER — SODIUM CHLORIDE 0.9 % IV SOLN
Freq: Once | INTRAVENOUS | Status: AC
  Administered 2016-08-08: 09:00:00 via INTRAVENOUS

## 2016-08-08 MED ORDER — DIPHENHYDRAMINE HCL 50 MG/ML IJ SOLN
50.0000 mg | Freq: Once | INTRAMUSCULAR | Status: AC
  Administered 2016-08-08: 50 mg via INTRAVENOUS

## 2016-08-08 MED ORDER — FAMOTIDINE IN NACL 20-0.9 MG/50ML-% IV SOLN
20.0000 mg | Freq: Once | INTRAVENOUS | Status: AC
  Administered 2016-08-08: 20 mg via INTRAVENOUS

## 2016-08-08 MED ORDER — HEPARIN SOD (PORK) LOCK FLUSH 100 UNIT/ML IV SOLN
500.0000 [IU] | Freq: Once | INTRAVENOUS | Status: AC | PRN
  Administered 2016-08-08: 500 [IU]
  Filled 2016-08-08: qty 5

## 2016-08-08 MED ORDER — FAMOTIDINE IN NACL 20-0.9 MG/50ML-% IV SOLN
INTRAVENOUS | Status: AC
  Filled 2016-08-08: qty 50

## 2016-08-08 MED ORDER — SODIUM CHLORIDE 0.9% FLUSH
10.0000 mL | INTRAVENOUS | Status: DC | PRN
  Administered 2016-08-08: 10 mL
  Filled 2016-08-08: qty 10

## 2016-08-08 MED ORDER — SODIUM CHLORIDE 0.9% FLUSH
10.0000 mL | Freq: Once | INTRAVENOUS | Status: AC
  Administered 2016-08-08: 10 mL
  Filled 2016-08-08: qty 10

## 2016-08-08 MED ORDER — DIPHENHYDRAMINE HCL 50 MG/ML IJ SOLN
INTRAMUSCULAR | Status: AC
  Filled 2016-08-08: qty 1

## 2016-08-08 NOTE — Patient Instructions (Signed)
[name] Discharge Instructions for Patients Receiving Chemotherapy  Today you received the following chemotherapy agents Taxol  To help prevent nausea and vomiting after your treatment, we encourage you to take your nausea medication   If you develop nausea and vomiting that is not controlled by your nausea medication, call the clinic.   BELOW ARE SYMPTOMS THAT SHOULD BE REPORTED IMMEDIATELY:  *FEVER GREATER THAN 100.5 F  *CHILLS WITH OR WITHOUT FEVER  NAUSEA AND VOMITING THAT IS NOT CONTROLLED WITH YOUR NAUSEA MEDICATION  *UNUSUAL SHORTNESS OF BREATH  *UNUSUAL BRUISING OR BLEEDING  TENDERNESS IN MOUTH AND THROAT WITH OR WITHOUT PRESENCE OF ULCERS  *URINARY PROBLEMS  *BOWEL PROBLEMS  UNUSUAL RASH Items with * indicate a potential emergency and should be followed up as soon as possible.  Feel free to call the clinic you have any questions or concerns. The clinic phone number is [phone].  Please show the CHEMO ALERT CARD at check-in to the Emergency Department and triage nurse.   

## 2016-08-14 ENCOUNTER — Ambulatory Visit (HOSPITAL_BASED_OUTPATIENT_CLINIC_OR_DEPARTMENT_OTHER): Payer: Medicaid Other

## 2016-08-14 ENCOUNTER — Encounter: Payer: Self-pay | Admitting: Hematology and Oncology

## 2016-08-14 ENCOUNTER — Encounter: Payer: Self-pay | Admitting: *Deleted

## 2016-08-14 ENCOUNTER — Other Ambulatory Visit (HOSPITAL_BASED_OUTPATIENT_CLINIC_OR_DEPARTMENT_OTHER): Payer: Medicaid Other

## 2016-08-14 ENCOUNTER — Ambulatory Visit (HOSPITAL_BASED_OUTPATIENT_CLINIC_OR_DEPARTMENT_OTHER): Payer: Medicaid Other | Admitting: Hematology and Oncology

## 2016-08-14 ENCOUNTER — Ambulatory Visit: Payer: Medicaid Other

## 2016-08-14 DIAGNOSIS — G620 Drug-induced polyneuropathy: Secondary | ICD-10-CM

## 2016-08-14 DIAGNOSIS — C773 Secondary and unspecified malignant neoplasm of axilla and upper limb lymph nodes: Secondary | ICD-10-CM | POA: Diagnosis not present

## 2016-08-14 DIAGNOSIS — C50312 Malignant neoplasm of lower-inner quadrant of left female breast: Secondary | ICD-10-CM

## 2016-08-14 DIAGNOSIS — Z5111 Encounter for antineoplastic chemotherapy: Secondary | ICD-10-CM | POA: Diagnosis present

## 2016-08-14 DIAGNOSIS — N130 Hydronephrosis with ureteropelvic junction obstruction: Secondary | ICD-10-CM | POA: Diagnosis not present

## 2016-08-14 DIAGNOSIS — Z171 Estrogen receptor negative status [ER-]: Principal | ICD-10-CM

## 2016-08-14 DIAGNOSIS — D509 Iron deficiency anemia, unspecified: Secondary | ICD-10-CM

## 2016-08-14 DIAGNOSIS — Z95828 Presence of other vascular implants and grafts: Secondary | ICD-10-CM

## 2016-08-14 LAB — COMPREHENSIVE METABOLIC PANEL
ALT: 28 U/L (ref 0–55)
ANION GAP: 11 meq/L (ref 3–11)
AST: 20 U/L (ref 5–34)
Albumin: 3.3 g/dL — ABNORMAL LOW (ref 3.5–5.0)
Alkaline Phosphatase: 65 U/L (ref 40–150)
BUN: 10.7 mg/dL (ref 7.0–26.0)
CHLORIDE: 107 meq/L (ref 98–109)
CO2: 23 meq/L (ref 22–29)
CREATININE: 0.7 mg/dL (ref 0.6–1.1)
Calcium: 9.4 mg/dL (ref 8.4–10.4)
EGFR: >90 mL/min/{1.73_m2} (ref >90)
Glucose: 86 mg/dl (ref 70–140)
Potassium: 3.4 mEq/L — ABNORMAL LOW (ref 3.5–5.1)
SODIUM: 141 meq/L (ref 136–145)
Total Bilirubin: 0.88 mg/dL (ref 0.20–1.20)
Total Protein: 6.7 g/dL (ref 6.4–8.3)

## 2016-08-14 LAB — CBC WITH DIFFERENTIAL/PLATELET
BASO%: 1.2 % (ref 0.0–2.0)
Basophils Absolute: 0.1 10*3/uL (ref 0.0–0.1)
EOS%: 2.9 % (ref 0.0–7.0)
Eosinophils Absolute: 0.2 10*3/uL (ref 0.0–0.5)
HCT: 38.6 % (ref 34.8–46.6)
HGB: 12.7 g/dL (ref 11.6–15.9)
LYMPH%: 17.6 % (ref 14.0–49.7)
MCH: 25.4 pg (ref 25.1–34.0)
MCHC: 33 g/dL (ref 31.5–36.0)
MCV: 77 fL — ABNORMAL LOW (ref 79.5–101.0)
MONO#: 0.4 10*3/uL (ref 0.1–0.9)
MONO%: 7.5 % (ref 0.0–14.0)
NEUT#: 3.7 10*3/uL (ref 1.5–6.5)
NEUT%: 70.8 % (ref 38.4–76.8)
Platelets: 240 10*3/uL (ref 145–400)
RBC: 5.02 10*6/uL (ref 3.70–5.45)
RDW: 18.9 % — ABNORMAL HIGH (ref 11.2–14.5)
WBC: 5.2 10*3/uL (ref 3.9–10.3)
lymph#: 0.9 10*3/uL (ref 0.9–3.3)

## 2016-08-14 MED ORDER — HEPARIN SOD (PORK) LOCK FLUSH 100 UNIT/ML IV SOLN
500.0000 [IU] | Freq: Once | INTRAVENOUS | Status: AC | PRN
  Administered 2016-08-14: 500 [IU]
  Filled 2016-08-14: qty 5

## 2016-08-14 MED ORDER — SODIUM CHLORIDE 0.9% FLUSH
10.0000 mL | INTRAVENOUS | Status: DC | PRN
  Administered 2016-08-14: 10 mL
  Filled 2016-08-14: qty 10

## 2016-08-14 MED ORDER — PACLITAXEL CHEMO INJECTION 300 MG/50ML
65.0000 mg/m2 | Freq: Once | INTRAVENOUS | Status: AC
  Administered 2016-08-14: 138 mg via INTRAVENOUS
  Filled 2016-08-14: qty 23

## 2016-08-14 MED ORDER — FAMOTIDINE IN NACL 20-0.9 MG/50ML-% IV SOLN
INTRAVENOUS | Status: AC
  Filled 2016-08-14: qty 50

## 2016-08-14 MED ORDER — SODIUM CHLORIDE 0.9% FLUSH
10.0000 mL | Freq: Once | INTRAVENOUS | Status: AC
  Administered 2016-08-14: 10 mL
  Filled 2016-08-14: qty 10

## 2016-08-14 MED ORDER — FAMOTIDINE IN NACL 20-0.9 MG/50ML-% IV SOLN
20.0000 mg | Freq: Once | INTRAVENOUS | Status: AC
  Administered 2016-08-14: 20 mg via INTRAVENOUS

## 2016-08-14 MED ORDER — DEXAMETHASONE SODIUM PHOSPHATE 100 MG/10ML IJ SOLN
20.0000 mg | Freq: Once | INTRAMUSCULAR | Status: AC
  Administered 2016-08-14: 20 mg via INTRAVENOUS
  Filled 2016-08-14: qty 2

## 2016-08-14 MED ORDER — DIPHENHYDRAMINE HCL 50 MG/ML IJ SOLN
INTRAMUSCULAR | Status: AC
  Filled 2016-08-14: qty 1

## 2016-08-14 MED ORDER — SODIUM CHLORIDE 0.9 % IV SOLN
Freq: Once | INTRAVENOUS | Status: AC
  Administered 2016-08-14: 13:00:00 via INTRAVENOUS

## 2016-08-14 MED ORDER — DIPHENHYDRAMINE HCL 50 MG/ML IJ SOLN
50.0000 mg | Freq: Once | INTRAMUSCULAR | Status: AC
  Administered 2016-08-14: 50 mg via INTRAVENOUS

## 2016-08-14 NOTE — Assessment & Plan Note (Signed)
03/22/2016: Left breast biopsy 8:00: IDC high-grade with necrosis, left axillary lymph node biopsy IDC, ER 0%, PR 0%, HER-2 negative ratio 1.42, Ki-67 90%; palpable lump 3.5 x 2.6 x 2.2 cm and 2.8 Center left axillary lymph node, T2 N1 stage IIB  Treatment plan based on multidisciplinary tumor board: 1. Neoadjuvant chemotherapy with Adriamycin and Cytoxan dose dense 4 followed by Taxolweekly 12 2. Followed by breast conserving surgery with sentinel lymph node study vs targeted axillary dissection 3. Followed by adjuvant radiation therapy --------------------------------------------------------------------------------------------------------------------------------------------- Current treatment: Completed 4 cycles of dose dense Adriamycin Cytoxan, today is cycle 10Taxol Echocardiogram 04/09/2016: EF 55-60% Closely monitoring for chemotherapy toxicities.  Chemotherapy toxicities: 1. Chemotherapy-induced peripheral neuropathy: I decreased the dosage of Taxol with cycle 6 2. Chemotherapy-induced nail changes 3. Fatigue  Microcytic anemia: Status post iron infusion She has had bleeding problems from the uterus. OnZoladex injection last given in the end of April  Bug bite on the thigh with erythema: Sent a prescription for Keflex 500 twice a day for 7 days  Renal issues with the left hydronephrosisand chronic ureteropelvic obstruction: Referredto urology.They recommended watchful waiting and monitoring every 3 months.  Return to clinic in 2weeksfor cycle 12Taxol

## 2016-08-14 NOTE — Patient Instructions (Signed)
[facility] Discharge Instructions for Patients Receiving Chemotherapy  Today you received the following chemotherapy agents Taxol  To help prevent nausea and vomiting after your treatment, we encourage you to take your nausea medication   If you develop nausea and vomiting that is not controlled by your nausea medication, call the clinic.   BELOW ARE SYMPTOMS THAT SHOULD BE REPORTED IMMEDIATELY:  *FEVER GREATER THAN 100.5 F  *CHILLS WITH OR WITHOUT FEVER  NAUSEA AND VOMITING THAT IS NOT CONTROLLED WITH YOUR NAUSEA MEDICATION  *UNUSUAL SHORTNESS OF BREATH  *UNUSUAL BRUISING OR BLEEDING  TENDERNESS IN MOUTH AND THROAT WITH OR WITHOUT PRESENCE OF ULCERS  *URINARY PROBLEMS  *BOWEL PROBLEMS  UNUSUAL RASH Items with * indicate a potential emergency and should be followed up as soon as possible.  Feel free to call the clinic you have any questions or concerns. The clinic phone number is [phone].  Please show the CHEMO ALERT CARD at check-in to the Emergency Department and triage nurse.   

## 2016-08-14 NOTE — Patient Instructions (Signed)

## 2016-08-14 NOTE — Progress Notes (Signed)
Patient Care Team: Nche, Charlene Brooke, NP as PCP - General (Internal Medicine) Alphonsa Overall, MD as Consulting Physician (General Surgery) Nicholas Lose, MD as Consulting Physician (Hematology and Oncology) Eppie Gibson, MD as Attending Physician (Radiation Oncology) Gardenia Phlegm, NP as Nurse Practitioner (Hematology and Oncology)  DIAGNOSIS:  Encounter Diagnosis  Name Primary?  . Malignant neoplasm of lower-inner quadrant of left breast in female, estrogen receptor negative ([place])     SUMMARY OF ONCOLOGIC HISTORY:   Malignant neoplasm of lower-inner quadrant of left breast in female, estrogen receptor negative ([place])   03/22/2016 Initial Diagnosis    Left breast biopsy 8:00: IDC high-grade with necrosis, left axillary lymph node biopsy IDC, ER 0%, PR 0%, HER-2 negative ratio 1.42, Ki-67 90%; palpable lump 3.5 x 2.6 x 2.2 cm and 2.8 Center left axillary lymph node, T2 N1 stage IIB      04/10/2016 Initial Biopsy    Additional biopsies left breast 9:30: Sclerosed intraductal papilloma with every 3 hours, right breast 1:00: Complex sclerosing lesion with UDH, right breast 8:00: Complex sclerosing lesion with UDH      04/17/2016 -  Neo-Adjuvant Chemotherapy    Dose dense Adriamycin and Cytoxan 4 followed by Taxol weekly 12      05/15/2016 Genetic Testing    Genetic counseling and testing for hereditary cancer syndromes performed on 05/15/2016. Results are negative for pathogenic mutations in 46 genes analyzed by [facility]'s Common Hereditary Cancers Panel. Results are dated 05/24/2016. Genes tested: APC, ATM, AXIN2, BARD1, BMPR1A, BRCA1, BRCA2, BRIP1, CDH1, CDKN2A, CHEK2, CTNNA1, DICER1, EPCAM, GREM1, HOXB13, KIT, MEN1, MLH1, MSH2, MSH3, MSH6, MUTYH, NBN, NF1, NTHL1, PALB2, PDGFRA, PMS2, POLD1, POLE, PTEN, RAD50, RAD51C, RAD51D, SDHA, SDHB, SDHC, SDHD, SMAD4, SMARCA4, STK11, TP53, TSC1, TSC2, and VHL.  A variant of uncertain significance (not clinically actionable) was noted in  MUTYH.          CHIEF COMPLIANT: Cycle 10 Taxol  INTERVAL HISTORY: AWANDA WILCOCK is a 46 year old with above-mentioned history of left breast cancer currently on neoadjuvant chemotherapy and today is cycle 10 of Taxol. She does have symptoms of numbness of the tips of the fingers. She does not have neuropathy in the toes. Denies any nausea vomiting.  REVIEW OF SYSTEMS:   Constitutional: Denies fevers, chills or abnormal weight loss Eyes: Denies blurriness of vision Ears, nose, mouth, throat, and face: Denies mucositis or sore throat Respiratory: Denies cough, dyspnea or wheezes Cardiovascular: Denies palpitation, chest discomfort Gastrointestinal:  Denies nausea, heartburn or change in bowel habits Skin: Denies abnormal skin rashes Lymphatics: Denies new lymphadenopathy or easy bruising Neurological: Neuropathy in the fingers Behavioral/Psych: Mood is stable, no new changes  Extremities: No lower extremity edema  All other systems were reviewed with the patient and are negative.  I have reviewed the past medical history, past surgical history, social history and family history with the patient and they are unchanged from previous note.  ALLERGIES:  has No Known Allergies.  MEDICATIONS:  Current Outpatient Prescriptions  Medication Sig Dispense Refill  . cephALEXin (KEFLEX) 500 MG capsule Take 1 capsule (500 mg total) by mouth 2 (two) times daily. 14 capsule 0  . cycloSPORINE (RESTASIS) 0.05 % ophthalmic emulsion Place 1 drop into both eyes 2 (two) times daily. 1 each 1  . lidocaine-prilocaine (EMLA) cream Apply to affected area once 30 g 3  . LORazepam (ATIVAN) 0.5 MG tablet Take 1 tablet (0.5 mg total) by mouth every 6 (six) hours as needed (Nausea or vomiting). 30 tablet  0  . meloxicam (MOBIC) 15 MG tablet Take 1 tablet (15 mg total) by mouth 2 (two) times daily. 60 tablet 3  . Multiple Vitamins-Minerals (WOMENS MULTI VITAMIN & MINERAL) TABS Take 1 tablet by mouth daily  after breakfast. 30 tablet 3  . ondansetron (ZOFRAN) 8 MG tablet Take 1 tablet (8 mg total) by mouth 2 (two) times daily as needed. Start on the third day after chemotherapy. 30 tablet 1  . prochlorperazine (COMPAZINE) 10 MG tablet Take 1 tablet (10 mg total) by mouth every 6 (six) hours as needed (Nausea or vomiting). 30 tablet 1   No current facility-administered medications for this visit.     PHYSICAL EXAMINATION: ECOG PERFORMANCE STATUS: 1 - Symptomatic but completely ambulatory  Vitals:   08/14/16 1129  BP: 128/86  Pulse: 66  Resp: 18  Temp: 98.2 F (36.8 C)   Filed Weights   08/14/16 1129  Weight: 211 lb 3.2 oz (95.8 kg)    GENERAL:alert, no distress and comfortable SKIN: skin color, texture, turgor are normal, no rashes or significant lesions EYES: normal, Conjunctiva are pink and non-injected, sclera clear OROPHARYNX:no exudate, no erythema and lips, buccal mucosa, and tongue normal  NECK: supple, thyroid normal size, non-tender, without nodularity LYMPH:  no palpable lymphadenopathy in the cervical, axillary or inguinal LUNGS: clear to auscultation and percussion with normal breathing effort HEART: regular rate & rhythm and no murmurs and no lower extremity edema ABDOMEN:abdomen soft, non-tender and normal bowel sounds MUSCULOSKELETAL:no cyanosis of digits and no clubbing  NEURO: alert & oriented x 3 with fluent speech, no focal motor/sensory deficits EXTREMITIES: No lower extremity edema  LABORATORY DATA:  I have reviewed the data as listed   Chemistry      Component Value Date/Time   NA 141 08/14/2016 1008   K 3.4 (L) 08/14/2016 1008   CL 108 05/15/2016 1147   CO2 23 08/14/2016 1008   BUN 10.7 08/14/2016 1008   CREATININE 0.7 08/14/2016 1008      Component Value Date/Time   CALCIUM 9.4 08/14/2016 1008   ALKPHOS 65 08/14/2016 1008   AST 20 08/14/2016 1008   ALT 28 08/14/2016 1008   BILITOT 0.88 08/14/2016 1008       Lab Results  Component Value  Date   WBC 5.2 08/14/2016   HGB 12.7 08/14/2016   HCT 38.6 08/14/2016   MCV 77.0 (L) 08/14/2016   PLT 240 08/14/2016   NEUTROABS 3.7 08/14/2016    ASSESSMENT & PLAN:  Malignant neoplasm of lower-inner quadrant of left breast in female, estrogen receptor negative ([place]) 03/22/2016: Left breast biopsy 8:00: IDC high-grade with necrosis, left axillary lymph node biopsy IDC, ER 0%, PR 0%, HER-2 negative ratio 1.42, Ki-67 90%; palpable lump 3.5 x 2.6 x 2.2 cm and 2.8 Center left axillary lymph node, T2 N1 stage IIB  Treatment plan based on multidisciplinary tumor board: 1. Neoadjuvant chemotherapy with Adriamycin and Cytoxan dose dense 4 followed by Taxolweekly 12 2. Followed by breast conserving surgery with sentinel lymph node study vs targeted axillary dissection 3. Followed by adjuvant radiation therapy --------------------------------------------------------------------------------------------------------------------------------------------- Current treatment: Completed 4 cycles of dose dense Adriamycin Cytoxan, today is cycle 10Taxol Echocardiogram 04/09/2016: EF 55-60% Closely monitoring for chemotherapy toxicities.  Chemotherapy toxicities: 1. Chemotherapy-induced peripheral neuropathy: I decreased the dosage of Taxol with cycle 6 2. Chemotherapy-induced nail changes 3. Fatigue  Microcytic anemia: Status post iron infusion hemoglobin today is 12.7 She has had bleeding problems from the uterus. OnZoladex injection last given in the  end of April  Renal issues with the left hydronephrosisand chronic ureteropelvic obstruction: Referredto urology.They recommended watchful waiting and monitoring every 3 months. We will arrange for breast MRI to be done after the last cycle of chemotherapy. She will be presented in the tumor board on 09/04/2016. Return to clinic in 2weeksfor cycle 12Taxol   I spent 25 minutes talking to the patient of which more than half was spent in  counseling and coordination of care.  Orders Placed This Encounter  Procedures  . MR BREAST BILATERAL W WO CONTRAST    Standing Status:   Future    Standing Expiration Date:   10/14/2017    Order Specific Question:   If indicated for the ordered procedure, I authorize the administration of contrast media per Radiology protocol    Answer:   Yes    Order Specific Question:   Reason for Exam (SYMPTOM  OR DIAGNOSIS REQUIRED)    Answer:   Breast cancer Post neoadj chemo    Order Specific Question:   Preferred imaging location?    Answer:   [facility] (table limit-350 lbs)    Order Specific Question:   Does the patient have a pacemaker or implanted devices?    Answer:   No    Order Specific Question:   What is the patient's sedation requirement?    Answer:   No Sedation   The patient has a good understanding of the overall plan. she agrees with it. she will call with any problems that may develop before the next visit here.   Rulon Eisenmenger, MD 08/14/16

## 2016-08-19 ENCOUNTER — Telehealth: Payer: Self-pay | Admitting: *Deleted

## 2016-08-19 ENCOUNTER — Emergency Department (HOSPITAL_COMMUNITY): Payer: Medicaid Other

## 2016-08-19 ENCOUNTER — Emergency Department (HOSPITAL_COMMUNITY)
Admission: EM | Admit: 2016-08-19 | Discharge: 2016-08-19 | Disposition: A | Discharge status: Home / Self Care | Payer: Medicaid Other | Attending: Emergency Medicine | Admitting: Emergency Medicine

## 2016-08-19 ENCOUNTER — Other Ambulatory Visit: Payer: Self-pay

## 2016-08-19 DIAGNOSIS — M79645 Pain in left finger(s): Secondary | ICD-10-CM | POA: Diagnosis present

## 2016-08-19 DIAGNOSIS — C50312 Malignant neoplasm of lower-inner quadrant of left female breast: Secondary | ICD-10-CM

## 2016-08-19 DIAGNOSIS — L03012 Cellulitis of left finger: Secondary | ICD-10-CM | POA: Insufficient documentation

## 2016-08-19 DIAGNOSIS — Z171 Estrogen receptor negative status [ER-]: Principal | ICD-10-CM

## 2016-08-19 DIAGNOSIS — Z79899 Other long term (current) drug therapy: Secondary | ICD-10-CM | POA: Insufficient documentation

## 2016-08-19 DIAGNOSIS — Z853 Personal history of malignant neoplasm of breast: Secondary | ICD-10-CM | POA: Insufficient documentation

## 2016-08-19 MED ORDER — BACITRACIN ZINC 500 UNIT/GM EX OINT
TOPICAL_OINTMENT | Freq: Two times a day (BID) | CUTANEOUS | Status: DC
  Administered 2016-08-19: 1 via TOPICAL
  Filled 2016-08-19: qty 4.5

## 2016-08-19 MED ORDER — SULFAMETHOXAZOLE-TRIMETHOPRIM 800-160 MG PO TABS: 1.0000 | ORAL_TABLET | Freq: Two times a day (BID) | ORAL | 0 refills | Status: AC

## 2016-08-19 MED ORDER — LIDOCAINE-EPINEPHRINE (PF) 2 %-1:200000 IJ SOLN
10.0000 mL | Freq: Once | INTRAMUSCULAR | Status: AC
  Administered 2016-08-19: 23:00:00
  Filled 2016-08-19: qty 20

## 2016-08-19 NOTE — Discharge Instructions (Addendum)
Take antibiotics as prescribed. You may use Tylenol as needed for pain. You will likely have some drainage for the next several days.  Keep the dressing on for 24 hours. After that, you may wash the area with soap and water gently and reapply dressing daily.  You may follow-up with her primary care provider if you continue to have pain in the finger. Return to the emergency department if you develop fever, chills, worsening pain, or spread of redness down your finger.

## 2016-08-19 NOTE — Progress Notes (Signed)
Sent referral request for Dr.Carey Wetzel Bjornstad (available clinical trial). Sent message to Blima Singer (HIM) to start referral process.

## 2016-08-19 NOTE — ED Provider Notes (Addendum)
[facility] DEPT Provider Note   CSN: [id number] Arrival date & time: 08/19/16  [place]     History   Chief Complaint Chief Complaint  Patient presents with  . Hand Pain    HPI Susan Morrison is a 46 y.o. female presenting with left index finger pain 2 days.  Patient states that last night she started to notice some pain around the nail of her right finger. In standing, she has had increasing pain of the finger and increasing swelling. The pain now wraps around her entire distal digit, and she has redness just pass the DIP. She denies drainage from the site. She reports the pain extends into her hand. She tried her pain medicine last night without relief. She took 2 extra strength Tylenol this afternoon with no relief. Patient is currently being treated for breast cancer, and has had 10 of her 12 chemotherapy treatments. Additionally she reports she is on Paxil, which is turned all of her fingernails break, but this is a chronic problem. She denies fever, chills, nausea, vomiting, numbness, or tingling. Movement and touching makes the pain worse. Nothing makes it better.  HPI  Past Medical History:  Diagnosis Date  . Anemia   . Breast mass 03/21/2016   left 7:00  . Cancer ([place])    left brest cancer   . Genetic testing 06/03/2016   Ms. Paules underwent genetic counseling and testing for hereditary cancer syndromes on 05/15/2016. Her results were negative for mutations in all 46 genes analyzed by [facility]'s 46-gene Common Hereditary Cancers Panel. Genes analyzed include: APC, ATM, AXIN2, BARD1, BMPR1A, BRCA1, BRCA2, BRIP1, CDH1, CDKN2A, CHEK2, CTNNA1, DICER1, EPCAM, GREM1, HOXB13, KIT, MEN1, MLH1, MSH2, MSH3, MSH6, MUTYH, NBN,    Patient Active Problem List   Diagnosis Date Noted  . Port catheter in place 06/11/2016  . Genetic testing 06/03/2016  . Keratoconjunctivitis of both eyes 06/03/2016  . UPJ obstruction, congenital 05/22/2016  . Iron deficiency anemia due to chronic  blood loss 04/03/2016  . Malignant neoplasm of lower-inner quadrant of left breast in female, estrogen receptor negative ([place]) 03/26/2016  . Menorrhagia with regular cycle 03/05/2016  . Anemia 03/05/2016  . Left breast mass 03/05/2016  . Elevated liver enzymes 03/05/2016    Past Surgical History:  Procedure Laterality Date  . DILATION AND CURETTAGE OF UTERUS     last done 2years ago  . PORTACATH PLACEMENT N/A 04/15/2016   Procedure: INSERTION PORT-A-CATH WITH [place];  Surgeon: Alphonsa Overall, MD;  Location: [facility] ORS;  Service: General;  Laterality: N/A;  . TUBAL LIGATION  1996    OB History    Gravida Para Term Preterm AB Living   3         3   SAB TAB Ectopic Multiple Live Births           3       Home Medications    Prior to Admission medications   Medication Sig Start Date End Date Taking? Authorizing Provider  acetaminophen (TYLENOL) 500 MG tablet Take 1,000 mg by mouth every 6 (six) hours as needed for moderate pain.   Yes [provider]  cycloSPORINE (RESTASIS) 0.05 % ophthalmic emulsion Place 1 drop into both eyes 2 (two) times daily. 06/03/16  Yes Nche, Charlene Brooke, NP  lidocaine-prilocaine (EMLA) cream Apply to affected area once Patient taking differently: Apply 1 application topically daily as needed (chemo treatments). Apply to affected area once 04/03/16  Yes Nicholas Lose, MD  LORazepam (ATIVAN) 0.5 MG tablet  Take 1 tablet (0.5 mg total) by mouth every 6 (six) hours as needed (Nausea or vomiting). 04/09/16  Yes Nicholas Lose, MD  meloxicam (MOBIC) 15 MG tablet Take 1 tablet (15 mg total) by mouth 2 (two) times daily. 07/03/16  Yes Nicholas Lose, MD  Multiple Vitamins-Minerals (WOMENS MULTI VITAMIN & MINERAL) TABS Take 1 tablet by mouth daily after breakfast. 03/05/16  Yes Nche, Charlene Brooke, NP  cephALEXin (KEFLEX) 500 MG capsule Take 1 capsule (500 mg total) by mouth 2 (two) times daily. Patient not taking: Reported on 08/19/2016 07/31/16   Nicholas Lose, MD    ondansetron (ZOFRAN) 8 MG tablet Take 1 tablet (8 mg total) by mouth 2 (two) times daily as needed. Start on the third day after chemotherapy. Patient not taking: Reported on 08/19/2016 04/03/16   Nicholas Lose, MD  prochlorperazine (COMPAZINE) 10 MG tablet Take 1 tablet (10 mg total) by mouth every 6 (six) hours as needed (Nausea or vomiting). Patient not taking: Reported on 08/19/2016 04/03/16   Nicholas Lose, MD  sulfamethoxazole-trimethoprim (BACTRIM DS,SEPTRA DS) 800-160 MG tablet Take 1 tablet by mouth 2 (two) times daily. 08/19/16 08/26/16  Isao Seltzer, PA-C    Family History Family History  Problem Relation Age of Onset  . Hypertension Mother   . Diabetes Mother   . Breast cancer Paternal Grandmother 56       d.62 bilateral breast cancer    Social History Social History  Substance Use Topics  . Smoking status: Never Smoker  . Smokeless tobacco: Never Used  . Alcohol use Yes     Comment: wine/social     Allergies   Patient has no known allergies.   Review of Systems Review of Systems  Musculoskeletal: Positive for arthralgias.  Neurological: Negative for numbness.     Physical Exam Updated Vital Signs BP (!) 150/110   Pulse 90   Temp 98.2 F (36.8 C) (Oral)   Resp 16   LMP 05/24/2016   SpO2 99%   Physical Exam  Constitutional: She is oriented to person, place, and time. She appears well-developed and well-nourished. No distress.  HENT:  Head: Normocephalic.  Eyes: Pupils are equal, round, and reactive to light.  Neck: Normal range of motion.  Cardiovascular: Normal rate and regular rhythm.   Pulmonary/Chest: Effort normal and breath sounds normal.  Abdominal: Soft.  Musculoskeletal:  Redness and swelling of distal left index finger. Redness extending just past the DIP but not into the PIP. No obvious laceration or injury. Patient with decreased range of motion at the DIP due to pain. Full range of motion of the PIP without pain range of motion of the  MCP without pain. Tenderness to palpation of the DIP and distal finger. No tenderness to palpation of the hand, proximal digit. Sensation intact. Pulses intact. Compartments soft.  Neurological: She is alert and oriented to person, place, and time. No sensory deficit.  Skin: Skin is warm.  Psychiatric: She has a normal mood and affect.  Nursing note and vitals reviewed.    ED Treatments / Results  Labs (all labs ordered are listed, but only abnormal results are displayed) Labs Reviewed - No data to display  EKG  EKG Interpretation None       Radiology Dg Finger Index Left  Result Date: 08/19/2016 CLINICAL DATA:  Initial evaluation for acute pain and swelling with redness to distal left index finger for 1 day. No injury. EXAM: LEFT INDEX FINGER 2+V COMPARISON:  None. FINDINGS: There is no evidence  of fracture or dislocation. There is no evidence of arthropathy or other focal bone abnormality. Soft tissues are unremarkable. IMPRESSION: No acute abnormality about the left index finger. Electronically Signed   By: Jeannine Boga M.D.   On: 08/19/2016 21:53    Procedures .Marland KitchenIncision and Drainage Date/Time: 08/19/2016 10:00 PM Performed by: Franchot Heidelberg Authorized by: Franchot Heidelberg   Consent:    Consent obtained:  Verbal   Consent given by:  Patient   Risks discussed:  Bleeding, incomplete drainage, pain and infection Location:    Type:  Abscess   Location:  Upper extremity   Upper extremity location:  Finger   Finger location:  L index finger Pre-procedure details:    Skin preparation:  Betadine Anesthesia (see MAR for exact dosages):    Anesthesia method:  Local infiltration   Local anesthetic:  Lidocaine 2% WITH epi Procedure type:    Complexity:  Simple Procedure details:    Incision types:  Single straight   Incision depth:  Dermal   Scalpel blade:  11   Wound management:  Irrigated with saline   Drainage:  Purulent   Drainage amount:  Moderate    Wound treatment:  Wound left open   Packing materials:  None Post-procedure details:    Patient tolerance of procedure:  Tolerated well, no immediate complications   (including critical care time)    Medications Ordered in ED Medications  bacitracin ointment (1 application Topical Given 08/19/16 2235)  lidocaine-EPINEPHrine (XYLOCAINE W/EPI) 2 %-1:200000 (PF) injection 10 mL ( Infiltration Given by Other 08/19/16 2235)     Initial Impression / Assessment and Plan / ED Course  I have reviewed the triage vital signs and the nursing notes.  Pertinent labs & imaging results that were available during my care of the patient were reviewed by me and considered in my medical decision making (see chart for details).     Patient with pain, redness, and swelling of the distal left index finger. Pain is worse with movement and touch. Physical exam shows likely paronychia. Redness extends just past the DIP, but not into the PIP. No streaking noted. Patient with full range of motion without pain of the MCP and PIP joint. Pulses intact, compartments soft. Sensation intact. Will order x-ray of the finger to ensure no bony involvement, as patient is currently a chemotherapy patient. Discussed case with attending, Dr. Darl Householder agrees to plan.  X-ray negative for bony involvement. I&D performed with expression of pus. Discussed post I&D care. Pt to manage pain with tylenol. Abx give for surrounding cellulitis and due to decreased immunes system. Patient appears safe for discharge. Return precautions given. Patient states she understands and agrees to plan.  Final Clinical Impressions(s) / ED Diagnoses   Final diagnoses:  Paronychia of left index finger  Cellulitis of finger of left hand    New Prescriptions Discharge Medication List as of 08/19/2016 10:28 PM    START taking these medications   Details  sulfamethoxazole-trimethoprim (BACTRIM DS,SEPTRA DS) 800-160 MG tablet Take 1 tablet by mouth 2 (two)  times daily., Starting Mon 08/19/2016, Until Mon 08/26/2016, Print         [place], [place], PA-C 08/19/16 2252    Drenda Freeze, MD 08/19/16 Donal[place], [place], PA-C 08/28/16 2326    Drenda Freeze, MD 08/28/16 [phone]

## 2016-08-19 NOTE — Telephone Encounter (Signed)
Pt called with c/o index finger being black/blue and sensitive. Pt denies injuring it or getting it caught. Relate fingernails are black. Discussed with pt that unfortunately one of the symptoms of taxol is skin and finger nail changes. Discussed soaking in ice water and tee tree oil and water combination. Received verbal understanding. Informed pt that if symptoms became worse to call back for assessment.

## 2016-08-19 NOTE — ED Notes (Signed)
Pt states she can take anything for pain except Ibuprofen

## 2016-08-19 NOTE — ED Triage Notes (Signed)
Pt states that she is on Taxol and has discoloration of her fingernails but has had swelling and discolaration along with pain to her L pointer finger since last night. Last chemo last Wednesday.  Alert and oriented.

## 2016-08-21 ENCOUNTER — Other Ambulatory Visit (HOSPITAL_BASED_OUTPATIENT_CLINIC_OR_DEPARTMENT_OTHER): Payer: Medicaid Other

## 2016-08-21 ENCOUNTER — Ambulatory Visit: Payer: Medicaid Other | Admitting: Hematology and Oncology

## 2016-08-21 ENCOUNTER — Ambulatory Visit (HOSPITAL_BASED_OUTPATIENT_CLINIC_OR_DEPARTMENT_OTHER): Payer: Medicaid Other

## 2016-08-21 VITALS — BP 113/69 | HR 73 | Temp 98.4°F | Resp 17

## 2016-08-21 DIAGNOSIS — C50312 Malignant neoplasm of lower-inner quadrant of left female breast: Secondary | ICD-10-CM

## 2016-08-21 DIAGNOSIS — Z171 Estrogen receptor negative status [ER-]: Principal | ICD-10-CM

## 2016-08-21 DIAGNOSIS — C773 Secondary and unspecified malignant neoplasm of axilla and upper limb lymph nodes: Secondary | ICD-10-CM | POA: Diagnosis not present

## 2016-08-21 DIAGNOSIS — Z5111 Encounter for antineoplastic chemotherapy: Secondary | ICD-10-CM | POA: Diagnosis present

## 2016-08-21 DIAGNOSIS — D500 Iron deficiency anemia secondary to blood loss (chronic): Secondary | ICD-10-CM

## 2016-08-21 DIAGNOSIS — Z95828 Presence of other vascular implants and grafts: Secondary | ICD-10-CM

## 2016-08-21 LAB — COMPREHENSIVE METABOLIC PANEL
ALBUMIN: 3.3 g/dL — AB (ref 3.5–5.0)
ALK PHOS: 75 U/L (ref 40–150)
ALT: 36 U/L (ref 0–55)
ANION GAP: 10 meq/L (ref 3–11)
AST: 25 U/L (ref 5–34)
BUN: 13.2 mg/dL (ref 7.0–26.0)
CALCIUM: 9.6 mg/dL (ref 8.4–10.4)
CO2: 23 mEq/L (ref 22–29)
Chloride: 107 mEq/L (ref 98–109)
Creatinine: 0.8 mg/dL (ref 0.6–1.1)
Glucose: 93 mg/dl (ref 70–140)
POTASSIUM: 3.7 meq/L (ref 3.5–5.1)
Sodium: 140 mEq/L (ref 136–145)
Total Bilirubin: 0.87 mg/dL (ref 0.20–1.20)
Total Protein: 6.9 g/dL (ref 6.4–8.3)

## 2016-08-21 LAB — CBC WITH DIFFERENTIAL/PLATELET
BASO%: 0.8 % (ref 0.0–2.0)
BASOS ABS: 0.1 10*3/uL (ref 0.0–0.1)
EOS ABS: 0.2 10*3/uL (ref 0.0–0.5)
EOS%: 2.7 % (ref 0.0–7.0)
HEMATOCRIT: 36.7 % (ref 34.8–46.6)
HEMOGLOBIN: 12.3 g/dL (ref 11.6–15.9)
LYMPH#: 1 10*3/uL (ref 0.9–3.3)
LYMPH%: 14.9 % (ref 14.0–49.7)
MCH: 25.8 pg (ref 25.1–34.0)
MCHC: 33.5 g/dL (ref 31.5–36.0)
MCV: 77.1 fL — AB (ref 79.5–101.0)
MONO#: 0.5 10*3/uL (ref 0.1–0.9)
MONO%: 8.2 % (ref 0.0–14.0)
NEUT#: 4.7 10*3/uL (ref 1.5–6.5)
NEUT%: 73.4 % (ref 38.4–76.8)
Platelets: 236 10*3/uL (ref 145–400)
RBC: 4.76 10*6/uL (ref 3.70–5.45)
RDW: 18.6 % — ABNORMAL HIGH (ref 11.2–14.5)
WBC: 6.5 10*3/uL (ref 3.9–10.3)

## 2016-08-21 MED ORDER — SODIUM CHLORIDE 0.9% FLUSH
10.0000 mL | INTRAVENOUS | Status: DC | PRN
  Administered 2016-08-21: 10 mL
  Filled 2016-08-21: qty 10

## 2016-08-21 MED ORDER — DIPHENHYDRAMINE HCL 50 MG/ML IJ SOLN
INTRAMUSCULAR | Status: AC
  Filled 2016-08-21: qty 1

## 2016-08-21 MED ORDER — SODIUM CHLORIDE 0.9 % IV SOLN
Freq: Once | INTRAVENOUS | Status: AC
  Administered 2016-08-21: 09:00:00 via INTRAVENOUS

## 2016-08-21 MED ORDER — DIPHENHYDRAMINE HCL 50 MG/ML IJ SOLN
50.0000 mg | Freq: Once | INTRAMUSCULAR | Status: AC
  Administered 2016-08-21: 50 mg via INTRAVENOUS

## 2016-08-21 MED ORDER — SODIUM CHLORIDE 0.9 % IV SOLN
20.0000 mg | Freq: Once | INTRAVENOUS | Status: AC
  Administered 2016-08-21: 20 mg via INTRAVENOUS
  Filled 2016-08-21: qty 2

## 2016-08-21 MED ORDER — FAMOTIDINE IN NACL 20-0.9 MG/50ML-% IV SOLN
INTRAVENOUS | Status: AC
  Filled 2016-08-21: qty 50

## 2016-08-21 MED ORDER — GOSERELIN ACETATE 3.6 MG ~~LOC~~ IMPL
3.6000 mg | DRUG_IMPLANT | Freq: Once | SUBCUTANEOUS | Status: AC
  Administered 2016-08-21: 3.6 mg via SUBCUTANEOUS
  Filled 2016-08-21: qty 3.6

## 2016-08-21 MED ORDER — FAMOTIDINE IN NACL 20-0.9 MG/50ML-% IV SOLN
20.0000 mg | Freq: Once | INTRAVENOUS | Status: AC
  Administered 2016-08-21: 20 mg via INTRAVENOUS

## 2016-08-21 MED ORDER — HEPARIN SOD (PORK) LOCK FLUSH 100 UNIT/ML IV SOLN
500.0000 [IU] | Freq: Once | INTRAVENOUS | Status: AC | PRN
  Administered 2016-08-21: 500 [IU]
  Filled 2016-08-21: qty 5

## 2016-08-21 MED ORDER — PACLITAXEL CHEMO INJECTION 300 MG/50ML
65.0000 mg/m2 | Freq: Once | INTRAVENOUS | Status: AC
  Administered 2016-08-21: 138 mg via INTRAVENOUS
  Filled 2016-08-21: qty 23

## 2016-08-21 NOTE — Progress Notes (Signed)
RN caring for pt in infusion called to inquire about okay to tx with recent visit to ED conerning possible infection of L pointer finger. Discussed with Dr. Lindi Adie and upon review of notes okay to proceed with tx today. Pt to call with any further issues.

## 2016-08-21 NOTE — Patient Instructions (Signed)
[facility] Discharge Instructions for Patients Receiving Chemotherapy  Today you received the following chemotherapy agents Taxol   To help prevent nausea and vomiting after your treatment, we encourage you to take your nausea medication as directed.   If you develop nausea and vomiting that is not controlled by your nausea medication, call the clinic.   BELOW ARE SYMPTOMS THAT SHOULD BE REPORTED IMMEDIATELY:  *FEVER GREATER THAN 100.5 F  *CHILLS WITH OR WITHOUT FEVER  NAUSEA AND VOMITING THAT IS NOT CONTROLLED WITH YOUR NAUSEA MEDICATION  *UNUSUAL SHORTNESS OF BREATH  *UNUSUAL BRUISING OR BLEEDING  TENDERNESS IN MOUTH AND THROAT WITH OR WITHOUT PRESENCE OF ULCERS  *URINARY PROBLEMS  *BOWEL PROBLEMS  UNUSUAL RASH Items with * indicate a potential emergency and should be followed up as soon as possible.  Feel free to call the clinic you have any questions or concerns. The clinic phone number is [phone].  Please show the CHEMO ALERT CARD at check-in to the Emergency Department and triage nurse.   

## 2016-08-22 ENCOUNTER — Ambulatory Visit (HOSPITAL_COMMUNITY)
Admission: RE | Admit: 2016-08-22 | Discharge: 2016-08-22 | Disposition: A | Discharge status: Home / Self Care | Payer: Medicaid Other | Source: Ambulatory Visit | Attending: Urology | Admitting: Urology

## 2016-08-22 DIAGNOSIS — R93422 Abnormal radiologic findings on diagnostic imaging of left kidney: Secondary | ICD-10-CM | POA: Insufficient documentation

## 2016-08-22 DIAGNOSIS — R93421 Abnormal radiologic findings on diagnostic imaging of right kidney: Secondary | ICD-10-CM | POA: Diagnosis not present

## 2016-08-22 DIAGNOSIS — Q6211 Congenital occlusion of ureteropelvic junction: Secondary | ICD-10-CM | POA: Diagnosis present

## 2016-08-22 DIAGNOSIS — Q6239 Other obstructive defects of renal pelvis and ureter: Secondary | ICD-10-CM

## 2016-08-22 MED ORDER — FUROSEMIDE 10 MG/ML IJ SOLN
48.0000 mg | Freq: Once | INTRAMUSCULAR | Status: AC
  Administered 2016-08-22: 48 mg via INTRAVENOUS

## 2016-08-22 MED ORDER — FUROSEMIDE 10 MG/ML IJ SOLN
INTRAMUSCULAR | Status: AC
Start: 2016-08-22 — End: 2016-08-22
  Filled 2016-08-22: qty 8

## 2016-08-22 MED ORDER — TECHNETIUM TC 99M MERTIATIDE
5.0000 | Freq: Once | INTRAVENOUS | Status: AC | PRN
  Administered 2016-08-22: 5 via INTRAVENOUS

## 2016-08-27 ENCOUNTER — Ambulatory Visit (HOSPITAL_COMMUNITY)
Admission: RE | Admit: 2016-08-27 | Discharge: 2016-08-27 | Disposition: A | Discharge status: Home / Self Care | Payer: Medicaid Other | Source: Ambulatory Visit | Attending: Hematology and Oncology | Admitting: Hematology and Oncology

## 2016-08-27 DIAGNOSIS — Z171 Estrogen receptor negative status [ER-]: Secondary | ICD-10-CM | POA: Diagnosis present

## 2016-08-27 DIAGNOSIS — R590 Localized enlarged lymph nodes: Secondary | ICD-10-CM | POA: Insufficient documentation

## 2016-08-27 DIAGNOSIS — C50312 Malignant neoplasm of lower-inner quadrant of left female breast: Secondary | ICD-10-CM | POA: Diagnosis not present

## 2016-08-27 MED ORDER — GADOBENATE DIMEGLUMINE 529 MG/ML IV SOLN
20.0000 mL | Freq: Once | INTRAVENOUS | Status: AC | PRN
  Administered 2016-08-27: 20 mL via INTRAVENOUS

## 2016-08-27 NOTE — Assessment & Plan Note (Signed)
03/22/2016: Left breast biopsy 8:00: IDC high-grade with necrosis, left axillary lymph node biopsy IDC, ER 0%, PR 0%, HER-2 negative ratio 1.42, Ki-67 90%; palpable lump 3.5 x 2.6 x 2.2 cm and 2.8 Center left axillary lymph node, T2 N1 stage IIB  Treatment plan based on multidisciplinary tumor board: 1. Neoadjuvant chemotherapy with Adriamycin and Cytoxan dose dense 4 followed by Taxolweekly 12 2. Followed by breast conserving surgery with sentinel lymph node study vs targeted axillary dissection 3. Followed by adjuvant radiation therapy --------------------------------------------------------------------------------------------------------------------------------------------- Current treatment: Completed 4 cycles of dose dense Adriamycin Cytoxan, today is cycle 12Taxol Echocardiogram 04/09/2016: EF 55-60% Closely monitoring for chemotherapy toxicities.  Chemotherapy toxicities: 1. Chemotherapy-induced peripheral neuropathy: I decreased the dosage of Taxol with cycle 6 2. Chemotherapy-induced nail changes 3. Fatigue  Microcytic anemia: Status post iron infusion hemoglobin today is 12.7 She has had bleeding problems from the uterus. OnZoladex injection last given in the end of April  Renal issues with the left hydronephrosisand chronic ureteropelvic obstruction: Referredto urology.They recommended watchful waiting and monitoring every 3 months. Breast MRI is already been set up She will be presented in the tumor board on 09/04/2016. Return to clinic after surgery

## 2016-08-28 ENCOUNTER — Encounter: Payer: Self-pay | Admitting: Hematology and Oncology

## 2016-08-28 ENCOUNTER — Ambulatory Visit: Payer: Medicaid Other

## 2016-08-28 ENCOUNTER — Telehealth: Payer: Self-pay | Admitting: Hematology and Oncology

## 2016-08-28 ENCOUNTER — Other Ambulatory Visit (HOSPITAL_BASED_OUTPATIENT_CLINIC_OR_DEPARTMENT_OTHER): Payer: Medicaid Other

## 2016-08-28 ENCOUNTER — Ambulatory Visit (HOSPITAL_BASED_OUTPATIENT_CLINIC_OR_DEPARTMENT_OTHER): Payer: Medicaid Other | Admitting: Hematology and Oncology

## 2016-08-28 ENCOUNTER — Ambulatory Visit (HOSPITAL_BASED_OUTPATIENT_CLINIC_OR_DEPARTMENT_OTHER): Payer: Medicaid Other

## 2016-08-28 ENCOUNTER — Encounter: Payer: Self-pay | Admitting: *Deleted

## 2016-08-28 ENCOUNTER — Telehealth: Payer: Self-pay

## 2016-08-28 DIAGNOSIS — C50312 Malignant neoplasm of lower-inner quadrant of left female breast: Secondary | ICD-10-CM | POA: Diagnosis present

## 2016-08-28 DIAGNOSIS — Z5111 Encounter for antineoplastic chemotherapy: Secondary | ICD-10-CM | POA: Diagnosis not present

## 2016-08-28 DIAGNOSIS — G629 Polyneuropathy, unspecified: Secondary | ICD-10-CM

## 2016-08-28 DIAGNOSIS — Z171 Estrogen receptor negative status [ER-]: Secondary | ICD-10-CM

## 2016-08-28 DIAGNOSIS — C773 Secondary and unspecified malignant neoplasm of axilla and upper limb lymph nodes: Secondary | ICD-10-CM

## 2016-08-28 DIAGNOSIS — N130 Hydronephrosis with ureteropelvic junction obstruction: Secondary | ICD-10-CM

## 2016-08-28 DIAGNOSIS — Z95828 Presence of other vascular implants and grafts: Secondary | ICD-10-CM

## 2016-08-28 LAB — CBC WITH DIFFERENTIAL/PLATELET
BASO%: 0.7 % (ref 0.0–2.0)
Basophils Absolute: 0 10*3/uL (ref 0.0–0.1)
EOS%: 2.4 % (ref 0.0–7.0)
Eosinophils Absolute: 0.1 10*3/uL (ref 0.0–0.5)
HCT: 36.5 % (ref 34.8–46.6)
HEMOGLOBIN: 12.4 g/dL (ref 11.6–15.9)
LYMPH#: 1 10*3/uL (ref 0.9–3.3)
LYMPH%: 24.9 % (ref 14.0–49.7)
MCH: 25.6 pg (ref 25.1–34.0)
MCHC: 34 g/dL (ref 31.5–36.0)
MCV: 75.3 fL — ABNORMAL LOW (ref 79.5–101.0)
MONO#: 0.5 10*3/uL (ref 0.1–0.9)
MONO%: 11.4 % (ref 0.0–14.0)
NEUT#: 2.5 10*3/uL (ref 1.5–6.5)
NEUT%: 60.6 % (ref 38.4–76.8)
NRBC: 0 % (ref 0–0)
Platelets: 241 10*3/uL (ref 145–400)
RBC: 4.85 10*6/uL (ref 3.70–5.45)
RDW: 17.4 % — AB (ref 11.2–14.5)
WBC: 4.1 10*3/uL (ref 3.9–10.3)

## 2016-08-28 LAB — COMPREHENSIVE METABOLIC PANEL
ALBUMIN: 3.4 g/dL — AB (ref 3.5–5.0)
ALK PHOS: 75 U/L (ref 40–150)
ALT: 40 U/L (ref 0–55)
AST: 31 U/L (ref 5–34)
Anion Gap: 9 mEq/L (ref 3–11)
BUN: 8 mg/dL (ref 7.0–26.0)
CO2: 23 meq/L (ref 22–29)
Calcium: 9.8 mg/dL (ref 8.4–10.4)
Chloride: 107 mEq/L (ref 98–109)
Creatinine: 0.7 mg/dL (ref 0.6–1.1)
GLUCOSE: 92 mg/dL (ref 70–140)
POTASSIUM: 4 meq/L (ref 3.5–5.1)
SODIUM: 139 meq/L (ref 136–145)
TOTAL PROTEIN: 6.8 g/dL (ref 6.4–8.3)
Total Bilirubin: 0.68 mg/dL (ref 0.20–1.20)

## 2016-08-28 MED ORDER — FAMOTIDINE IN NACL 20-0.9 MG/50ML-% IV SOLN
INTRAVENOUS | Status: AC
  Filled 2016-08-28: qty 50

## 2016-08-28 MED ORDER — DIPHENHYDRAMINE HCL 50 MG/ML IJ SOLN
INTRAMUSCULAR | Status: AC
  Filled 2016-08-28: qty 1

## 2016-08-28 MED ORDER — SODIUM CHLORIDE 0.9% FLUSH
10.0000 mL | INTRAVENOUS | Status: DC | PRN
  Administered 2016-08-28: 10 mL
  Filled 2016-08-28: qty 10

## 2016-08-28 MED ORDER — FAMOTIDINE IN NACL 20-0.9 MG/50ML-% IV SOLN
20.0000 mg | Freq: Once | INTRAVENOUS | Status: AC
Start: 2016-08-28 — End: 2016-08-28
  Administered 2016-08-28: 20 mg via INTRAVENOUS

## 2016-08-28 MED ORDER — DIPHENHYDRAMINE HCL 50 MG/ML IJ SOLN
50.0000 mg | Freq: Once | INTRAMUSCULAR | Status: AC
  Administered 2016-08-28: 50 mg via INTRAVENOUS

## 2016-08-28 MED ORDER — SODIUM CHLORIDE 0.9 % IV SOLN
Freq: Once | INTRAVENOUS | Status: AC
  Administered 2016-08-28: 12:00:00 via INTRAVENOUS

## 2016-08-28 MED ORDER — SODIUM CHLORIDE 0.9% FLUSH
10.0000 mL | Freq: Once | INTRAVENOUS | Status: AC
  Administered 2016-08-28: 10 mL
  Filled 2016-08-28: qty 10

## 2016-08-28 MED ORDER — DEXAMETHASONE SODIUM PHOSPHATE 100 MG/10ML IJ SOLN
20.0000 mg | Freq: Once | INTRAMUSCULAR | Status: AC
  Administered 2016-08-28: 20 mg via INTRAVENOUS
  Filled 2016-08-28: qty 2

## 2016-08-28 MED ORDER — HEPARIN SOD (PORK) LOCK FLUSH 100 UNIT/ML IV SOLN
500.0000 [IU] | Freq: Once | INTRAVENOUS | Status: AC | PRN
  Administered 2016-08-28: 500 [IU]
  Filled 2016-08-28: qty 5

## 2016-08-28 MED ORDER — PACLITAXEL CHEMO INJECTION 300 MG/50ML
65.0000 mg/m2 | Freq: Once | INTRAVENOUS | Status: AC
  Administered 2016-08-28: 138 mg via INTRAVENOUS
  Filled 2016-08-28: qty 23

## 2016-08-28 NOTE — Progress Notes (Signed)
Patient Care Team: Nche, Charlene Brooke, NP as PCP - General (Internal Medicine) Alphonsa Overall, MD as Consulting Physician (General Surgery) Nicholas Lose, MD as Consulting Physician (Hematology and Oncology) Eppie Gibson, MD as Attending Physician (Radiation Oncology) Gardenia Phlegm, NP as Nurse Practitioner (Hematology and Oncology)  DIAGNOSIS:  Encounter Diagnosis  Name Primary?  . Malignant neoplasm of lower-inner quadrant of left breast in female, estrogen receptor negative ([place])     SUMMARY OF ONCOLOGIC HISTORY:   Malignant neoplasm of lower-inner quadrant of left breast in female, estrogen receptor negative ([place])   03/22/2016 Initial Diagnosis    Left breast biopsy 8:00: IDC high-grade with necrosis, left axillary lymph node biopsy IDC, ER 0%, PR 0%, HER-2 negative ratio 1.42, Ki-67 90%; palpable lump 3.5 x 2.6 x 2.2 cm and 2.8 Center left axillary lymph node, T2 N1 stage IIB      04/10/2016 Initial Biopsy    Additional biopsies left breast 9:30: Sclerosed intraductal papilloma with every 3 hours, right breast 1:00: Complex sclerosing lesion with UDH, right breast 8:00: Complex sclerosing lesion with UDH      04/17/2016 -  Neo-Adjuvant Chemotherapy    Dose dense Adriamycin and Cytoxan 4 followed by Taxol weekly 12      05/15/2016 Genetic Testing    Genetic counseling and testing for hereditary cancer syndromes performed on 05/15/2016. Results are negative for pathogenic mutations in 46 genes analyzed by [employer]'s Common Hereditary Cancers Panel. Results are dated 05/24/2016. Genes tested: APC, ATM, AXIN2, BARD1, BMPR1A, BRCA1, BRCA2, BRIP1, CDH1, CDKN2A, CHEK2, CTNNA1, DICER1, EPCAM, GREM1, HOXB13, KIT, MEN1, MLH1, MSH2, MSH3, MSH6, MUTYH, NBN, NF1, NTHL1, PALB2, PDGFRA, PMS2, POLD1, POLE, PTEN, RAD50, RAD51C, RAD51D, SDHA, SDHB, SDHC, SDHD, SMAD4, SMARCA4, STK11, TP53, TSC1, TSC2, and VHL.  A variant of uncertain significance (not clinically actionable) was noted in  MUTYH.          CHIEF COMPLIANT: Cycle 12 Taxol last cycle  INTERVAL HISTORY: Susan Morrison is a 46 year old with above-mentioned history of triple negative breast cancer currently on neoadjuvant chemotherapy and today the last cycle of Taxol. Her biggest issue has been discharged under the nails for which she went to the emergency room and had it drained. It appears to be doing slightly better. Neuropathy has been stable. She had an MRI of the breast yesterday.  REVIEW OF SYSTEMS:   Constitutional: Denies fevers, chills or abnormal weight loss Eyes: Denies blurriness of vision Ears, nose, mouth, throat, and face: Denies mucositis or sore throat Respiratory: Denies cough, dyspnea or wheezes Cardiovascular: Denies palpitation, chest discomfort Gastrointestinal:  Denies nausea, heartburn or change in bowel habits Skin: Denies abnormal skin rashes Lymphatics: Denies new lymphadenopathy or easy bruising Neurological: Mild neuropathy grade 1 Behavioral/Psych: Mood is stable, no new changes  Extremities: No lower extremity edema  All other systems were reviewed with the patient and are negative.  I have reviewed the past medical history, past surgical history, social history and family history with the patient and they are unchanged from previous note.  ALLERGIES:  has No Known Allergies.  MEDICATIONS:  Current Outpatient Prescriptions  Medication Sig Dispense Refill  . acetaminophen (TYLENOL) 500 MG tablet Take 1,000 mg by mouth every 6 (six) hours as needed for moderate pain.    . cephALEXin (KEFLEX) 500 MG capsule Take 1 capsule (500 mg total) by mouth 2 (two) times daily. (Patient not taking: Reported on 08/19/2016) 14 capsule 0  . cycloSPORINE (RESTASIS) 0.05 % ophthalmic emulsion Place 1 drop into  both eyes 2 (two) times daily. 1 each 1  . lidocaine-prilocaine (EMLA) cream Apply to affected area once (Patient taking differently: Apply 1 application topically daily as needed  (chemo treatments). Apply to affected area once) 30 g 3  . LORazepam (ATIVAN) 0.5 MG tablet Take 1 tablet (0.5 mg total) by mouth every 6 (six) hours as needed (Nausea or vomiting). 30 tablet 0  . meloxicam (MOBIC) 15 MG tablet Take 1 tablet (15 mg total) by mouth 2 (two) times daily. 60 tablet 3  . Multiple Vitamins-Minerals (WOMENS MULTI VITAMIN & MINERAL) TABS Take 1 tablet by mouth daily after breakfast. 30 tablet 3  . ondansetron (ZOFRAN) 8 MG tablet Take 1 tablet (8 mg total) by mouth 2 (two) times daily as needed. Start on the third day after chemotherapy. (Patient not taking: Reported on 08/19/2016) 30 tablet 1  . prochlorperazine (COMPAZINE) 10 MG tablet Take 1 tablet (10 mg total) by mouth every 6 (six) hours as needed (Nausea or vomiting). (Patient not taking: Reported on 08/19/2016) 30 tablet 1   No current facility-administered medications for this visit.     PHYSICAL EXAMINATION: ECOG PERFORMANCE STATUS: 1 - Symptomatic but completely ambulatory  Vitals:   08/28/16 0950  BP: (!) 134/91  Pulse: 80  Resp: 20  Temp: 98.3 F (36.8 C)   Filed Weights   08/28/16 0950  Weight: 212 lb 12.8 oz (96.5 kg)    GENERAL:alert, no distress and comfortable SKIN: skin color, texture, turgor are normal, no rashes or significant lesions EYES: normal, Conjunctiva are pink and non-injected, sclera clear OROPHARYNX:no exudate, no erythema and lips, buccal mucosa, and tongue normal  NECK: supple, thyroid normal size, non-tender, without nodularity LYMPH:  no palpable lymphadenopathy in the cervical, axillary or inguinal LUNGS: clear to auscultation and percussion with normal breathing effort HEART: regular rate & rhythm and no murmurs and no lower extremity edema ABDOMEN:abdomen soft, non-tender and normal bowel sounds MUSCULOSKELETAL:no cyanosis of digits and no clubbing  NEURO: alert & oriented x 3 with fluent speech,Grade 1 peripheral neuropathy EXTREMITIES: No lower extremity  edema  LABORATORY DATA:  I have reviewed the data as listed   Chemistry      Component Value Date/Time   NA 140 08/21/2016 0840   K 3.7 08/21/2016 0840   CL 108 05/15/2016 1147   CO2 23 08/21/2016 0840   BUN 13.2 08/21/2016 0840   CREATININE 0.8 08/21/2016 0840      Component Value Date/Time   CALCIUM 9.6 08/21/2016 0840   ALKPHOS 75 08/21/2016 0840   AST 25 08/21/2016 0840   ALT 36 08/21/2016 0840   BILITOT 0.87 08/21/2016 0840       Lab Results  Component Value Date   WBC 4.1 08/28/2016   HGB 12.4 08/28/2016   HCT 36.5 08/28/2016   MCV 75.3 (L) 08/28/2016   PLT 241 08/28/2016   NEUTROABS 2.5 08/28/2016    ASSESSMENT & PLAN:  Malignant neoplasm of lower-inner quadrant of left breast in female, estrogen receptor negative ([place]) 03/22/2016: Left breast biopsy 8:00: IDC high-grade with necrosis, left axillary lymph node biopsy IDC, ER 0%, PR 0%, HER-2 negative ratio 1.42, Ki-67 90%; palpable lump 3.5 x 2.6 x 2.2 cm and 2.8 Center left axillary lymph node, T2 N1 stage IIB  Treatment plan based on multidisciplinary tumor board: 1. Neoadjuvant chemotherapy with Adriamycin and Cytoxan dose dense 4 followed by Taxolweekly 12 2. Followed by breast conserving surgery with sentinel lymph node study vs targeted axillary dissection 3.  Followed by adjuvant radiation therapy --------------------------------------------------------------------------------------------------------------------------------------------- Current treatment: Completed 4 cycles of dose dense Adriamycin Cytoxan, today is cycle 12Taxol Echocardiogram 04/09/2016: EF 55-60% Closely monitoring for chemotherapy toxicities.  Chemotherapy toxicities: 1. Chemotherapy-induced peripheral neuropathy: I decreased the dosage of Taxol with cycle 6 2. Chemotherapy-induced nail changes 3. Fatigue  Microcytic anemia: Status post iron infusion hemoglobin today is 12.4 She has had bleeding problems from the uterus.  OnZoladex injection last given in the end of April, Last injection be given in August 2018. After that she will talk to her gynecologist about having her ovaries taken out.  Renal issues with the left hydronephrosisand chronic ureteropelvic obstruction: Referredto urology.They recommended watchful waiting and monitoring every 3 months. Breast MRI 08/27/2016: He does not been officially read. But to my review the primary breast tumor appears to have completely resolved. There appear to be some axillary lymph nodes that I am unsure if they are pathologically enlarged  She will be presented in the tumor board on 09/04/2016. Return to clinic after surgery  I spent 25 minutes talking to the patient of which more than half was spent in counseling and coordination of care.  No orders of the defined types were placed in this encounter.  The patient has a good understanding of the overall plan. she agrees with it. she will call with any problems that may develop before the next visit here.   Rulon Eisenmenger, MD 08/28/16

## 2016-08-28 NOTE — Patient Instructions (Signed)
[facility] Discharge Instructions for Patients Receiving Chemotherapy  Today you received the following chemotherapy agents:  Taxol  To help prevent nausea and vomiting after your treatment, we encourage you to take your nausea medication as prescribed.   If you develop nausea and vomiting that is not controlled by your nausea medication, call the clinic.   BELOW ARE SYMPTOMS THAT SHOULD BE REPORTED IMMEDIATELY:  *FEVER GREATER THAN 100.5 F  *CHILLS WITH OR WITHOUT FEVER  NAUSEA AND VOMITING THAT IS NOT CONTROLLED WITH YOUR NAUSEA MEDICATION  *UNUSUAL SHORTNESS OF BREATH  *UNUSUAL BRUISING OR BLEEDING  TENDERNESS IN MOUTH AND THROAT WITH OR WITHOUT PRESENCE OF ULCERS  *URINARY PROBLEMS  *BOWEL PROBLEMS  UNUSUAL RASH Items with * indicate a potential emergency and should be followed up as soon as possible.  Feel free to call the clinic you have any questions or concerns. The clinic phone number is [phone].  Please show the CHEMO ALERT CARD at check-in to the Emergency Department and triage nurse.   

## 2016-08-28 NOTE — Telephone Encounter (Signed)
Called pt to let her know that Dr.Anders will not be available to take new pt until 8/22, however, pt is able to come and see Dr.Dees on 09/13/16. Confirmed time and date with Dr.Dees and notified pt. Pt verbalized understanding and told pt that Dr.Anders office will be reaching out to her next week for more instructions prior to her appt.

## 2016-08-28 NOTE — Telephone Encounter (Signed)
Faxed records to Dr. Prentice Docker office Nurse will review records and call pt with appt.

## 2016-08-28 NOTE — Patient Instructions (Signed)

## 2016-09-05 ENCOUNTER — Other Ambulatory Visit: Payer: Self-pay | Admitting: Surgery

## 2016-09-05 DIAGNOSIS — D242 Benign neoplasm of left breast: Secondary | ICD-10-CM

## 2016-09-05 DIAGNOSIS — N649 Disorder of breast, unspecified: Secondary | ICD-10-CM

## 2016-09-05 DIAGNOSIS — C50912 Malignant neoplasm of unspecified site of left female breast: Secondary | ICD-10-CM

## 2016-09-16 ENCOUNTER — Telehealth: Payer: Self-pay | Admitting: *Deleted

## 2016-09-16 NOTE — Telephone Encounter (Signed)
"  Derrill Kay Dr. Albertina Parr [phone]) calling to find out what Dr.Gudena would like for this patient.  She was scheduled based referral received.  Dr.Dees messaged me to clarify.  Has seen patient for clinical trials.  Patient told her she is not interested, is not receiving chemotherapy and has not had surgery yet.  Care Everywhere shows she has received chemotherapy.  What does patient need scheduled at [place] patient inquiring her understanding of [facility] appointment, her lymph nodes and pending surgery.   "I have met with Dr.Newman awaiting a call to set up surgery for lumpectomy.  I am happy with my care in [place].  I do not need studies of the lymph nodes.  There is an indentation Dr. Lucia Gaskins can see from pictures but not able to feel in my armpit.  Chemotherapy shrunk it.  Went to [facility] at Dr. Geralyn Flash recommendation for second opinion.  The appointment went [facility] as soon as I told her I am not interested in clinical trials/research.  No test were run.  She backed up with no discussion or options."  Provider returns to [facility] office on 09-25-2016.    Routing call information to [employer] collaborative nurses and providers for review.  Any  further patient communication will be with collaborative nurse.  Otila Kluver with [facility] awaiting return call.

## 2016-09-17 ENCOUNTER — Other Ambulatory Visit: Payer: Self-pay | Admitting: Surgery

## 2016-09-17 DIAGNOSIS — C50912 Malignant neoplasm of unspecified site of left female breast: Secondary | ICD-10-CM

## 2016-09-18 ENCOUNTER — Other Ambulatory Visit: Payer: Self-pay | Admitting: Oncology

## 2016-09-18 ENCOUNTER — Ambulatory Visit (HOSPITAL_BASED_OUTPATIENT_CLINIC_OR_DEPARTMENT_OTHER): Payer: Medicaid Other

## 2016-09-18 ENCOUNTER — Other Ambulatory Visit: Payer: Self-pay | Admitting: *Deleted

## 2016-09-18 DIAGNOSIS — C773 Secondary and unspecified malignant neoplasm of axilla and upper limb lymph nodes: Secondary | ICD-10-CM | POA: Diagnosis not present

## 2016-09-18 DIAGNOSIS — C50312 Malignant neoplasm of lower-inner quadrant of left female breast: Secondary | ICD-10-CM

## 2016-09-18 DIAGNOSIS — Z171 Estrogen receptor negative status [ER-]: Principal | ICD-10-CM

## 2016-09-18 DIAGNOSIS — D500 Iron deficiency anemia secondary to blood loss (chronic): Secondary | ICD-10-CM

## 2016-09-18 DIAGNOSIS — Z5111 Encounter for antineoplastic chemotherapy: Secondary | ICD-10-CM

## 2016-09-18 DIAGNOSIS — Z95828 Presence of other vascular implants and grafts: Secondary | ICD-10-CM

## 2016-09-18 MED ORDER — GOSERELIN ACETATE 3.6 MG ~~LOC~~ IMPL
3.6000 mg | DRUG_IMPLANT | Freq: Once | SUBCUTANEOUS | Status: AC
  Administered 2016-09-18: 3.6 mg via SUBCUTANEOUS
  Filled 2016-09-18: qty 3.6

## 2016-09-18 NOTE — Patient Instructions (Signed)
Goserelin injection What is this medicine? GOSERELIN (GOE se rel in) is similar to a hormone found in the body. It lowers the amount of sex hormones that the body makes. Men will have lower testosterone levels and women will have lower estrogen levels while taking this medicine. In men, this medicine is used to treat prostate cancer; the injection is either given once per month or once every 12 weeks. A once per month injection (only) is used to treat women with endometriosis, dysfunctional uterine bleeding, or advanced breast cancer. This medicine may be used for other purposes; ask your health care provider or pharmacist if you have questions. COMMON BRAND NAME(S): Zoladex What should I tell my health care provider before I take this medicine? They need to know if you have any of these conditions (some only apply to women): -diabetes -heart disease or previous heart attack -high blood pressure -high cholesterol -kidney disease -osteoporosis or low bone density -problems passing urine -spinal cord injury -stroke -tobacco smoker -an unusual or allergic reaction to goserelin, hormone therapy, other medicines, foods, dyes, or preservatives -pregnant or trying to get pregnant -breast-feeding How should I use this medicine? This medicine is for injection under the skin. It is given by a health care professional in a hospital or clinic setting. Men receive this injection once every 4 weeks or once every 12 weeks. Women will only receive the once every 4 weeks injection. Talk to your pediatrician regarding the use of this medicine in children. Special care may be needed. Overdosage: If you think you have taken too much of this medicine contact a poison control center or emergency room at once. NOTE: This medicine is only for you. Do not share this medicine with others. What if I miss a dose? It is important not to miss your dose. Call your doctor or health care professional if you are unable to  keep an appointment. What may interact with this medicine? -female hormones like estrogen -herbal or dietary supplements like black cohosh, chasteberry, or DHEA -female hormones like testosterone -prasterone This list may not describe all possible interactions. Give your health care provider a list of all the medicines, herbs, non-prescription drugs, or dietary supplements you use. Also tell them if you smoke, drink alcohol, or use illegal drugs. Some items may interact with your medicine. What should I watch for while using this medicine? Visit your doctor or health care professional for regular checks on your progress. Your symptoms may appear to get worse during the first weeks of this therapy. Tell your doctor or healthcare professional if your symptoms do not start to get better or if they get worse after this time. Your bones may get weaker if you take this medicine for a long time. If you smoke or frequently drink alcohol you may increase your risk of bone loss. A family history of osteoporosis, chronic use of drugs for seizures (convulsions), or corticosteroids can also increase your risk of bone loss. Talk to your doctor about how to keep your bones strong. This medicine should stop regular monthly menstration in women. Tell your doctor if you continue to menstrate. Women should not become pregnant while taking this medicine or for 12 weeks after stopping this medicine. Women should inform their doctor if they wish to become pregnant or think they might be pregnant. There is a potential for serious side effects to an unborn child. Talk to your health care professional or pharmacist for more information. Do not breast-feed an infant while taking   this medicine. Men should inform their doctors if they wish to father a child. This medicine may lower sperm counts. Talk to your health care professional or pharmacist for more information. What side effects may I notice from receiving this  medicine? Side effects that you should report to your doctor or health care professional as soon as possible: -allergic reactions like skin rash, itching or hives, swelling of the face, lips, or tongue -bone pain -breathing problems -changes in vision -chest pain -feeling faint or lightheaded, falls -fever, chills -pain, swelling, warmth in the leg -pain, tingling, numbness in the hands or feet -signs and symptoms of low blood pressure like dizziness; feeling faint or lightheaded, falls; unusually weak or tired -stomach pain -swelling of the ankles, feet, hands -trouble passing urine or change in the amount of urine -unusually high or low blood pressure -unusually weak or tired Side effects that usually do not require medical attention (report to your doctor or health care professional if they continue or are bothersome): -change in sex drive or performance -changes in breast size in both males and females -changes in emotions or moods -headache -hot flashes -irritation at site where injected -loss of appetite -skin problems like acne, dry skin -vaginal dryness This list may not describe all possible side effects. Call your doctor for medical advice about side effects. You may report side effects to FDA at 1-800-FDA-1088. Where should I keep my medicine? This drug is given in a hospital or clinic and will not be stored at home. NOTE: This sheet is a summary. It may not cover all possible information. If you have questions about this medicine, talk to your doctor, pharmacist, or health care provider.  2018 Elsevier/Gold Standard (2013-03-30 11:10:35)  

## 2016-09-20 ENCOUNTER — Telehealth: Payer: Self-pay | Admitting: Hematology and Oncology

## 2016-09-20 NOTE — Telephone Encounter (Signed)
sw pt to confirm 9/27 appt at 11 am per sch msg

## 2016-09-23 NOTE — Telephone Encounter (Signed)
Will confirm and check with Dr.Gudena on what pt needs done at [facility].

## 2016-09-24 ENCOUNTER — Telehealth: Payer: Self-pay | Admitting: Hematology and Oncology

## 2016-09-24 NOTE — Telephone Encounter (Signed)
Spoke with patient regarding the addition of an injection on 9/12.

## 2016-10-01 ENCOUNTER — Ambulatory Visit: Payer: Medicaid Other | Admitting: Hematology and Oncology

## 2016-10-16 ENCOUNTER — Ambulatory Visit (HOSPITAL_BASED_OUTPATIENT_CLINIC_OR_DEPARTMENT_OTHER): Payer: Medicaid Other

## 2016-10-16 VITALS — BP 140/72 | HR 69 | Temp 97.8°F | Resp 18

## 2016-10-16 DIAGNOSIS — D500 Iron deficiency anemia secondary to blood loss (chronic): Secondary | ICD-10-CM

## 2016-10-16 DIAGNOSIS — C773 Secondary and unspecified malignant neoplasm of axilla and upper limb lymph nodes: Secondary | ICD-10-CM

## 2016-10-16 DIAGNOSIS — Z5111 Encounter for antineoplastic chemotherapy: Secondary | ICD-10-CM

## 2016-10-16 DIAGNOSIS — Z95828 Presence of other vascular implants and grafts: Secondary | ICD-10-CM

## 2016-10-16 DIAGNOSIS — C50312 Malignant neoplasm of lower-inner quadrant of left female breast: Secondary | ICD-10-CM | POA: Diagnosis not present

## 2016-10-16 MED ORDER — GOSERELIN ACETATE 3.6 MG ~~LOC~~ IMPL
3.6000 mg | DRUG_IMPLANT | Freq: Once | SUBCUTANEOUS | Status: AC
  Administered 2016-10-16: 3.6 mg via SUBCUTANEOUS
  Filled 2016-10-16: qty 3.6

## 2016-10-16 NOTE — Patient Instructions (Signed)
Goserelin injection What is this medicine? GOSERELIN (GOE se rel in) is similar to a hormone found in the body. It lowers the amount of sex hormones that the body makes. Men will have lower testosterone levels and women will have lower estrogen levels while taking this medicine. In men, this medicine is used to treat prostate cancer; the injection is either given once per month or once every 12 weeks. A once per month injection (only) is used to treat women with endometriosis, dysfunctional uterine bleeding, or advanced breast cancer. This medicine may be used for other purposes; ask your health care provider or pharmacist if you have questions. COMMON BRAND NAME(S): Zoladex What should I tell my health care provider before I take this medicine? They need to know if you have any of these conditions (some only apply to women): -diabetes -heart disease or previous heart attack -high blood pressure -high cholesterol -kidney disease -osteoporosis or low bone density -problems passing urine -spinal cord injury -stroke -tobacco smoker -an unusual or allergic reaction to goserelin, hormone therapy, other medicines, foods, dyes, or preservatives -pregnant or trying to get pregnant -breast-feeding How should I use this medicine? This medicine is for injection under the skin. It is given by a health care professional in a hospital or clinic setting. Men receive this injection once every 4 weeks or once every 12 weeks. Women will only receive the once every 4 weeks injection. Talk to your pediatrician regarding the use of this medicine in children. Special care may be needed. Overdosage: If you think you have taken too much of this medicine contact a poison control center or emergency room at once. NOTE: This medicine is only for you. Do not share this medicine with others. What if I miss a dose? It is important not to miss your dose. Call your doctor or health care professional if you are unable to  keep an appointment. What may interact with this medicine? -female hormones like estrogen -herbal or dietary supplements like black cohosh, chasteberry, or DHEA -female hormones like testosterone -prasterone This list may not describe all possible interactions. Give your health care provider a list of all the medicines, herbs, non-prescription drugs, or dietary supplements you use. Also tell them if you smoke, drink alcohol, or use illegal drugs. Some items may interact with your medicine. What should I watch for while using this medicine? Visit your doctor or health care professional for regular checks on your progress. Your symptoms may appear to get worse during the first weeks of this therapy. Tell your doctor or healthcare professional if your symptoms do not start to get better or if they get worse after this time. Your bones may get weaker if you take this medicine for a long time. If you smoke or frequently drink alcohol you may increase your risk of bone loss. A family history of osteoporosis, chronic use of drugs for seizures (convulsions), or corticosteroids can also increase your risk of bone loss. Talk to your doctor about how to keep your bones strong. This medicine should stop regular monthly menstration in women. Tell your doctor if you continue to menstrate. Women should not become pregnant while taking this medicine or for 12 weeks after stopping this medicine. Women should inform their doctor if they wish to become pregnant or think they might be pregnant. There is a potential for serious side effects to an unborn child. Talk to your health care professional or pharmacist for more information. Do not breast-feed an infant while taking   this medicine. Men should inform their doctors if they wish to father a child. This medicine may lower sperm counts. Talk to your health care professional or pharmacist for more information. What side effects may I notice from receiving this  medicine? Side effects that you should report to your doctor or health care professional as soon as possible: -allergic reactions like skin rash, itching or hives, swelling of the face, lips, or tongue -bone pain -breathing problems -changes in vision -chest pain -feeling faint or lightheaded, falls -fever, chills -pain, swelling, warmth in the leg -pain, tingling, numbness in the hands or feet -signs and symptoms of low blood pressure like dizziness; feeling faint or lightheaded, falls; unusually weak or tired -stomach pain -swelling of the ankles, feet, hands -trouble passing urine or change in the amount of urine -unusually high or low blood pressure -unusually weak or tired Side effects that usually do not require medical attention (report to your doctor or health care professional if they continue or are bothersome): -change in sex drive or performance -changes in breast size in both males and females -changes in emotions or moods -headache -hot flashes -irritation at site where injected -loss of appetite -skin problems like acne, dry skin -vaginal dryness This list may not describe all possible side effects. Call your doctor for medical advice about side effects. You may report side effects to FDA at 1-800-FDA-1088. Where should I keep my medicine? This drug is given in a hospital or clinic and will not be stored at home. NOTE: This sheet is a summary. It may not cover all possible information. If you have questions about this medicine, talk to your doctor, pharmacist, or health care provider.  2018 Elsevier/Gold Standard (2013-03-30 11:10:35)  

## 2016-10-17 ENCOUNTER — Encounter (HOSPITAL_BASED_OUTPATIENT_CLINIC_OR_DEPARTMENT_OTHER): Payer: Self-pay | Admitting: *Deleted

## 2016-10-23 ENCOUNTER — Ambulatory Visit
Admission: RE | Admit: 2016-10-23 | Discharge: 2016-10-23 | Disposition: A | Discharge status: Home / Self Care | Payer: Medicaid Other | Source: Ambulatory Visit | Attending: Surgery | Admitting: Surgery

## 2016-10-23 ENCOUNTER — Other Ambulatory Visit: Payer: Self-pay | Admitting: Surgery

## 2016-10-23 DIAGNOSIS — C50912 Malignant neoplasm of unspecified site of left female breast: Secondary | ICD-10-CM

## 2016-10-23 DIAGNOSIS — N649 Disorder of breast, unspecified: Secondary | ICD-10-CM

## 2016-10-23 DIAGNOSIS — D242 Benign neoplasm of left breast: Secondary | ICD-10-CM

## 2016-10-23 NOTE — H&P (Signed)
Susan Morrison  Location: [facility] Surgery Patient #: [id number] DOB: [birth year] Single / Language: Susan Morrison / Race: Black or African American Female  History of Present Illness   The patient is a 46 year old female who presents with a complaint of Left breast cancer.  Her PCP is Wilfred Lacy, NP   She was seen at the [facility] 04/03/2016 - Drs. [place] and [place]  She with her aunt, Ludger Nutting.  She comes for follow up after chemotx. Her last chemotherapy was 28 August 2016. She has done well from the treatment, cannot feel any residual masses, and has a good attitude. She saw Dr. Lindi Adie on 27 August 2016. He has made an appointment for her to see a physician at [facility] for a second opinion. I don't know whom she is seeing. She had an MRI of the breast on 27 August 2016. It showed 1.Dominant mass in the lower left breast with surrounding satellite nodules no longer identified. No new areas of abnormal enhancement seen in the left breast.  2. Improved appearance with decreased size of left axillary lymphadenopathy. 3. Biopsy clip artifact in the inner left breast at site of previously biopsied papilloma, with no surrounding abnormal enhancement. 4. Previously seen 3 areas of focal non mass enhancement in the right breast are no longer identified, with biopsy clips at sites of prior biopsied complex sclerosing lesions.  History of left breast cancer (03/2016): She felt a small knot in her left breast. This prompted her first mammogram. A grandmother on ther father's side had breast cancer. Her LMP was 2/22.  She underwent mammograms at [facility] on 02/19/2016 which showed in the left breast at 7:30 o'clock location a 2.5 x 2.6 cm mass. She also had enlarged left axillary lymph node. Biopsy of the left breast and left axillay lymph node ([id number]) on 03/22/2016 showed IDC, High grade, triple negative, Ki67 -  90% Genetics were negative. She has a variant of unknown significance. Getting Neoadjuvant chemothx - Adriamycin and cytoxan, followed by taxol.  Her breast MRI on 04/05/2016 - 3.9 cm left breast cancer. 3 adjacent small masses that are similar - the larest mesures 6 x 5 mm. She had a biopsies of both breast - 04/10/2016 - [id number] - left - 9:30 o'clock - sclerosed intraductal papiloma, right 1:00 - complex sclerosing lesion, right 8:00 - complex sclerosing lesion   Plan: 1) She is a candidate for left breast lumpectomy, targeted left axillary node dissection, excision of left breast papilloma, and biopsy of right breast x 2.  Past Medical History: 1. Left ureteral obstruction seen on CT scan on 04/12/2016. To see urology - Dr. Jacinto Reap. [place] 2. Gall stone 3. Power port placed - right subclavian - 04/15/2016 - D. Emanuele Mcwhirter  Social History: Has significant other. ?married? Significant other - Sarita Bottom, aunt - Susan Morrison (she used to work with Dr. Sondra Come), [place], daughter Susan Morrison, in room. She has 3 children: [place] - 46 yo, Susan Morrison - 21, Susan Morrison - 21. 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 Restasis (0.05% Emulsion, Ophthalmic) Active. Lidocaine-Prilocaine (2.5-2.5% Cream, External) Active. Compazine (10MG Tablet, Oral) Active. Multiple Vitamin (1 (one) Oral) Active. Medications Reconciled  Vitals Malachy Moan RMA; 09/05/2016 1:37 PM) 09/05/2016 1:36 PM Weight: 216.2 lb Height: 66in  Body Surface Area: 2.07 m Body Mass Index: 34.9 kg/m  Temp.: 97.68F  Pulse: 71 (Regular)  BP: 118/78 (Sitting, Left Arm, Standard)   Physical Exam  General: WN AA alert and generally healthy  appearing. Skin: Inspection and palpation of the skin unremarkable.  Eyes: Conjunctivae white, pupils equal. Face, ears, nose, mouth, and throat: Face - normal. Normal ears and nose. Lips and teeth normal.  Neck: Supple. No mass. Trachea midline. No thyroid mass.   Lungs - Clear Heart - RRR, no murmur  Lymph Nodes - I feel no abnormal cervical, supraclavicular, or axillary nodes. I could originally feel a high left axillary node, but I do not feel it now.  Breasts: right - No mass or nodule. Port in [place] of right breast Left - She had a mass in the 7 o'clock position of her left breast - but it is gone.  Extremities - Good strength and motion in upper extremities. No lymphedema.   Assessment & Plan  1.  BREAST CANCER, STAGE 2, LEFT (C50.912)  Story: She underwent mammograms at [facility] on 02/19/2016 which showed in the 7:30 o'clock location a 2.5 x 2.6 cm mass. She also had enlarged lymph node. Biopsy of the left breast and left axillay lymph node ([id number]) on 03/22/2016 showed IDC, High grade, triple negative, Ki67 - 90%  Treating oncologist - Gudena/Kinard  Plan:   1) Lumpectomy of left breast cancer (seed localization) with targeted left axillary dissection (seed loc)   2) Excise left breast papilloma (seed loc) - 9:30 o'clock   3) Excise two areas of right breast CSL (seed loc) - 1 and 8 o'clock  2.  PAPILLOMA OF LEFT BREAST (D24.2) 3.  BREAST MASS, RIGHT (N63.10)  Impression: Her breast MRI on 04/05/2016 - 3 discrete areas of focal non mass enhancement.   She had a biopsies of both breast - 04/10/2016 - [id number] - left - 9:30 o'clock - sclerosed intraductal papiloma, right 1:00 - complex sclerosing lesion, right 8:00 - complex sclerosing lesion  4. Left ureteral obstruction seen on CT scan on 04/12/2016. To see urology - Dr. Jacinto Reap. [place] 5. Gall stone 6. Power port placed - right subclavian - 04/15/2016 - D. Levada Schilling, MD,  [facility] Surgery Pager: [phone] Office phone:  [phone]

## 2016-10-23 NOTE — Progress Notes (Signed)
Ensure drink given to pt today.  To drink by 0800.

## 2016-10-24 ENCOUNTER — Ambulatory Visit (HOSPITAL_BASED_OUTPATIENT_CLINIC_OR_DEPARTMENT_OTHER): Payer: Medicaid Other | Admitting: Anesthesiology

## 2016-10-24 ENCOUNTER — Ambulatory Visit
Admission: RE | Admit: 2016-10-24 | Discharge: 2016-10-24 | Disposition: A | Discharge status: Home / Self Care | Payer: Medicaid Other | Source: Ambulatory Visit | Attending: Surgery | Admitting: Surgery

## 2016-10-24 ENCOUNTER — Encounter (HOSPITAL_COMMUNITY)
Admission: RE | Admit: 2016-10-24 | Discharge: 2016-10-24 | Disposition: A | Discharge status: Home / Self Care | Payer: Medicaid Other | Source: Ambulatory Visit | Attending: Surgery | Admitting: Surgery

## 2016-10-24 ENCOUNTER — Encounter (HOSPITAL_BASED_OUTPATIENT_CLINIC_OR_DEPARTMENT_OTHER)
Admission: RE | Disposition: A | Discharge status: Home / Self Care | Payer: Self-pay | Source: Ambulatory Visit | Attending: Surgery

## 2016-10-24 ENCOUNTER — Ambulatory Visit (HOSPITAL_BASED_OUTPATIENT_CLINIC_OR_DEPARTMENT_OTHER)
Admission: RE | Admit: 2016-10-24 | Discharge: 2016-10-24 | Disposition: A | Discharge status: Home / Self Care | Payer: Medicaid Other | Source: Ambulatory Visit | Attending: Surgery | Admitting: Surgery

## 2016-10-24 ENCOUNTER — Encounter (HOSPITAL_BASED_OUTPATIENT_CLINIC_OR_DEPARTMENT_OTHER): Payer: Self-pay | Admitting: Emergency Medicine

## 2016-10-24 DIAGNOSIS — K219 Gastro-esophageal reflux disease without esophagitis: Secondary | ICD-10-CM | POA: Insufficient documentation

## 2016-10-24 DIAGNOSIS — C50912 Malignant neoplasm of unspecified site of left female breast: Secondary | ICD-10-CM

## 2016-10-24 DIAGNOSIS — N6031 Fibrosclerosis of right breast: Secondary | ICD-10-CM | POA: Insufficient documentation

## 2016-10-24 DIAGNOSIS — Z171 Estrogen receptor negative status [ER-]: Secondary | ICD-10-CM | POA: Diagnosis not present

## 2016-10-24 DIAGNOSIS — Z853 Personal history of malignant neoplasm of breast: Secondary | ICD-10-CM | POA: Diagnosis not present

## 2016-10-24 DIAGNOSIS — E669 Obesity, unspecified: Secondary | ICD-10-CM | POA: Insufficient documentation

## 2016-10-24 DIAGNOSIS — D242 Benign neoplasm of left breast: Secondary | ICD-10-CM | POA: Diagnosis present

## 2016-10-24 DIAGNOSIS — D649 Anemia, unspecified: Secondary | ICD-10-CM | POA: Insufficient documentation

## 2016-10-24 DIAGNOSIS — Z6835 Body mass index (BMI) 35.0-35.9, adult: Secondary | ICD-10-CM | POA: Insufficient documentation

## 2016-10-24 DIAGNOSIS — Z79899 Other long term (current) drug therapy: Secondary | ICD-10-CM | POA: Insufficient documentation

## 2016-10-24 DIAGNOSIS — Z803 Family history of malignant neoplasm of breast: Secondary | ICD-10-CM | POA: Insufficient documentation

## 2016-10-24 DIAGNOSIS — D241 Benign neoplasm of right breast: Secondary | ICD-10-CM | POA: Diagnosis not present

## 2016-10-24 HISTORY — DX: Nausea with vomiting, unspecified: R11.2

## 2016-10-24 HISTORY — DX: Other specified postprocedural states: Z98.890

## 2016-10-24 HISTORY — PX: RADIOACTIVE SEED GUIDED AXILLARY SENTINEL LYMPH NODE: SHX6735

## 2016-10-24 HISTORY — PX: RADIOACTIVE SEED GUIDED EXCISIONAL BREAST BIOPSY: SHX6490

## 2016-10-24 SURGERY — RADIOACTIVE SEED GUIDED BREAST BIOPSY
Anesthesia: General | Site: Breast | Laterality: Left

## 2016-10-24 MED ORDER — OXYCODONE HCL 5 MG/5ML PO SOLN: 5.0000 mg | Freq: Once | ORAL | Status: DC | PRN

## 2016-10-24 MED ORDER — LACTATED RINGERS IV SOLN
INTRAVENOUS | Status: DC | PRN
  Administered 2016-10-24 (×2): via INTRAVENOUS

## 2016-10-24 MED ORDER — PROPOFOL 10 MG/ML IV BOLUS
INTRAVENOUS | Status: AC
  Filled 2016-10-24: qty 40

## 2016-10-24 MED ORDER — MIDAZOLAM HCL 2 MG/2ML IJ SOLN
1.0000 mg | INTRAMUSCULAR | Status: DC | PRN
  Administered 2016-10-24: 2 mg via INTRAVENOUS

## 2016-10-24 MED ORDER — SCOPOLAMINE 1 MG/3DAYS TD PT72
1.0000 | MEDICATED_PATCH | Freq: Once | TRANSDERMAL | Status: DC | PRN
  Administered 2016-10-24: 1.5 mg via TRANSDERMAL

## 2016-10-24 MED ORDER — HYDROCODONE-ACETAMINOPHEN 5-325 MG PO TABS: 1.0000 | ORAL_TABLET | Freq: Four times a day (QID) | ORAL | 0 refills | Status: DC | PRN

## 2016-10-24 MED ORDER — LIDOCAINE 2% (20 MG/ML) 5 ML SYRINGE
INTRAMUSCULAR | Status: AC
  Filled 2016-10-24: qty 5

## 2016-10-24 MED ORDER — SODIUM CHLORIDE 0.9 % IJ SOLN
INTRAVENOUS | Status: DC | PRN
  Administered 2016-10-24: 1 mL

## 2016-10-24 MED ORDER — FENTANYL CITRATE (PF) 100 MCG/2ML IJ SOLN
INTRAMUSCULAR | Status: AC
  Filled 2016-10-24: qty 2

## 2016-10-24 MED ORDER — GABAPENTIN 300 MG PO CAPS
300.0000 mg | ORAL_CAPSULE | ORAL | Status: AC
  Administered 2016-10-24: 300 mg via ORAL

## 2016-10-24 MED ORDER — HYDROMORPHONE HCL 1 MG/ML IJ SOLN
0.2500 mg | INTRAMUSCULAR | Status: DC | PRN
  Administered 2016-10-24: 0.5 mg via INTRAVENOUS

## 2016-10-24 MED ORDER — LIDOCAINE HCL (CARDIAC) 20 MG/ML IV SOLN
INTRAVENOUS | Status: DC | PRN
  Administered 2016-10-24: 50 mg via INTRAVENOUS

## 2016-10-24 MED ORDER — FENTANYL CITRATE (PF) 100 MCG/2ML IJ SOLN
INTRAMUSCULAR | Status: AC
Start: 2016-10-24 — End: 2016-10-24
  Filled 2016-10-24: qty 2

## 2016-10-24 MED ORDER — FENTANYL CITRATE (PF) 100 MCG/2ML IJ SOLN
50.0000 ug | INTRAMUSCULAR | Status: DC | PRN
  Administered 2016-10-24: 100 ug via INTRAVENOUS

## 2016-10-24 MED ORDER — LACTATED RINGERS IV SOLN
INTRAVENOUS | Status: DC
  Administered 2016-10-24: 10:00:00 via INTRAVENOUS

## 2016-10-24 MED ORDER — ACETAMINOPHEN 500 MG PO TABS
ORAL_TABLET | ORAL | Status: AC
  Filled 2016-10-24: qty 2

## 2016-10-24 MED ORDER — MIDAZOLAM HCL 2 MG/2ML IJ SOLN
INTRAMUSCULAR | Status: AC
  Filled 2016-10-24: qty 2

## 2016-10-24 MED ORDER — PROPOFOL 10 MG/ML IV BOLUS
INTRAVENOUS | Status: DC | PRN
  Administered 2016-10-24: 20 mg via INTRAVENOUS
  Administered 2016-10-24: 200 mg via INTRAVENOUS
  Administered 2016-10-24: 20 mg via INTRAVENOUS

## 2016-10-24 MED ORDER — CHLORHEXIDINE GLUCONATE CLOTH 2 % EX PADS: 6.0000 | MEDICATED_PAD | Freq: Once | CUTANEOUS | Status: DC

## 2016-10-24 MED ORDER — DEXAMETHASONE SODIUM PHOSPHATE 10 MG/ML IJ SOLN
INTRAMUSCULAR | Status: AC
  Filled 2016-10-24: qty 1

## 2016-10-24 MED ORDER — MEPERIDINE HCL 25 MG/ML IJ SOLN: 6.2500 mg | INTRAMUSCULAR | Status: DC | PRN

## 2016-10-24 MED ORDER — ONDANSETRON HCL 4 MG/2ML IJ SOLN
INTRAMUSCULAR | Status: AC
  Filled 2016-10-24: qty 2

## 2016-10-24 MED ORDER — TECHNETIUM TC 99M SULFUR COLLOID FILTERED
1.0000 | Freq: Once | INTRAVENOUS | Status: AC | PRN
  Administered 2016-10-24: 1 via INTRADERMAL

## 2016-10-24 MED ORDER — PROMETHAZINE HCL 25 MG/ML IJ SOLN: 6.2500 mg | INTRAMUSCULAR | Status: DC | PRN

## 2016-10-24 MED ORDER — BUPIVACAINE-EPINEPHRINE (PF) 0.5% -1:200000 IJ SOLN
INTRAMUSCULAR | Status: DC | PRN
  Administered 2016-10-24: 30 mL

## 2016-10-24 MED ORDER — GLYCOPYRROLATE 0.2 MG/ML IJ SOLN
INTRAMUSCULAR | Status: DC | PRN
  Administered 2016-10-24: 0.2 mg via INTRAVENOUS

## 2016-10-24 MED ORDER — GABAPENTIN 300 MG PO CAPS
ORAL_CAPSULE | ORAL | Status: AC
  Filled 2016-10-24: qty 1

## 2016-10-24 MED ORDER — ONDANSETRON HCL 4 MG/2ML IJ SOLN
INTRAMUSCULAR | Status: DC | PRN
  Administered 2016-10-24: 4 mg via INTRAVENOUS

## 2016-10-24 MED ORDER — CEFAZOLIN SODIUM-DEXTROSE 2-4 GM/100ML-% IV SOLN
2.0000 g | INTRAVENOUS | Status: AC
  Administered 2016-10-24: 2 g via INTRAVENOUS

## 2016-10-24 MED ORDER — SCOPOLAMINE 1 MG/3DAYS TD PT72
MEDICATED_PATCH | TRANSDERMAL | Status: AC
  Filled 2016-10-24: qty 1

## 2016-10-24 MED ORDER — DEXAMETHASONE SODIUM PHOSPHATE 4 MG/ML IJ SOLN
INTRAMUSCULAR | Status: DC | PRN
  Administered 2016-10-24: 8 mg via INTRAVENOUS

## 2016-10-24 MED ORDER — CEFAZOLIN SODIUM-DEXTROSE 2-4 GM/100ML-% IV SOLN
INTRAVENOUS | Status: AC
  Filled 2016-10-24: qty 100

## 2016-10-24 MED ORDER — BUPIVACAINE-EPINEPHRINE (PF) 0.25% -1:200000 IJ SOLN
INTRAMUSCULAR | Status: DC | PRN
  Administered 2016-10-24: 30 mL

## 2016-10-24 MED ORDER — HYDROMORPHONE HCL 1 MG/ML IJ SOLN
INTRAMUSCULAR | Status: AC
  Filled 2016-10-24: qty 0.5

## 2016-10-24 MED ORDER — FENTANYL CITRATE (PF) 100 MCG/2ML IJ SOLN
INTRAMUSCULAR | Status: DC | PRN
  Administered 2016-10-24 (×8): 25 ug via INTRAVENOUS
  Administered 2016-10-24: 50 ug via INTRAVENOUS
  Administered 2016-10-24 (×4): 25 ug via INTRAVENOUS

## 2016-10-24 MED ORDER — ACETAMINOPHEN 500 MG PO TABS
1000.0000 mg | ORAL_TABLET | ORAL | Status: AC
  Administered 2016-10-24: 1000 mg via ORAL

## 2016-10-24 MED ORDER — BUPIVACAINE-EPINEPHRINE (PF) 0.25% -1:200000 IJ SOLN
INTRAMUSCULAR | Status: AC
  Filled 2016-10-24: qty 30

## 2016-10-24 MED ORDER — OXYCODONE HCL 5 MG PO TABS: 5.0000 mg | ORAL_TABLET | Freq: Once | ORAL | Status: DC | PRN

## 2016-10-24 SURGICAL SUPPLY — 65 items
ADH SKN CLS APL DERMABOND .7 (GAUZE/BANDAGES/DRESSINGS) ×6
APL SKNCLS STERI-STRIP NONHPOA (GAUZE/BANDAGES/DRESSINGS)
BENZOIN TINCTURE PRP APPL 2/3 (GAUZE/BANDAGES/DRESSINGS) IMPLANT
BINDER BREAST LRG (GAUZE/BANDAGES/DRESSINGS) IMPLANT
BINDER BREAST XLRG (GAUZE/BANDAGES/DRESSINGS) IMPLANT
BINDER BREAST XXLRG (GAUZE/BANDAGES/DRESSINGS) IMPLANT
BLADE HEX COATED 2.75 (ELECTRODE) IMPLANT
BLADE SURG 10 STRL SS (BLADE) ×2 IMPLANT
BLADE SURG 15 STRL LF DISP TIS (BLADE) ×4 IMPLANT
BLADE SURG 15 STRL SS (BLADE) ×10
CANISTER SUC SOCK COL 7IN (MISCELLANEOUS) IMPLANT
CANISTER SUCT 1200ML W/VALVE (MISCELLANEOUS) ×5 IMPLANT
CHLORAPREP W/TINT 26ML (MISCELLANEOUS) ×5 IMPLANT
CLIP VESOCCLUDE SM WIDE 6/CT (CLIP) ×8 IMPLANT
CLOSURE WOUND 1/2 X4 (GAUZE/BANDAGES/DRESSINGS)
CLOSURE WOUND 1/4X4 (GAUZE/BANDAGES/DRESSINGS)
COVER BACK TABLE 60X90IN (DRAPES) ×5 IMPLANT
COVER MAYO STAND STRL (DRAPES) ×5 IMPLANT
COVER PROBE W GEL 5X96 (DRAPES) ×2 IMPLANT
DECANTER SPIKE VIAL GLASS SM (MISCELLANEOUS) IMPLANT
DERMABOND ADVANCED (GAUZE/BANDAGES/DRESSINGS) ×4
DERMABOND ADVANCED .7 DNX12 (GAUZE/BANDAGES/DRESSINGS) ×4 IMPLANT
DEVICE DUBIN W/COMP PLATE 8390 (MISCELLANEOUS) ×16 IMPLANT
DRAPE LAPAROSCOPIC ABDOMINAL (DRAPES) ×5 IMPLANT
DRAPE UTILITY XL STRL (DRAPES) ×5 IMPLANT
DRSG PAD ABDOMINAL 8X10 ST (GAUZE/BANDAGES/DRESSINGS) IMPLANT
ELECT COATED BLADE 2.86 ST (ELECTRODE) ×5 IMPLANT
ELECT REM PT RETURN 9FT ADLT (ELECTROSURGICAL) ×5
ELECTRODE REM PT RTRN 9FT ADLT (ELECTROSURGICAL) ×3 IMPLANT
GAUZE SPONGE 4X4 12PLY STRL (GAUZE/BANDAGES/DRESSINGS) ×8 IMPLANT
GAUZE SPONGE 4X4 12PLY STRL LF (GAUZE/BANDAGES/DRESSINGS) IMPLANT
GLOVE BIO SURGEON STRL SZ7 (GLOVE) ×3 IMPLANT
GLOVE BIOGEL PI IND STRL 7.0 (GLOVE) ×1 IMPLANT
GLOVE BIOGEL PI INDICATOR 7.0 (GLOVE) ×2
GLOVE ECLIPSE 6.5 STRL STRAW (GLOVE) ×3 IMPLANT
GLOVE EXAM NITRILE MD LF STRL (GLOVE) ×3 IMPLANT
GLOVE SURG SIGNA 7.5 PF LTX (GLOVE) ×10 IMPLANT
GOWN STRL REUS W/ TWL LRG LVL3 (GOWN DISPOSABLE) ×4 IMPLANT
GOWN STRL REUS W/ TWL XL LVL3 (GOWN DISPOSABLE) ×3 IMPLANT
GOWN STRL REUS W/TWL LRG LVL3 (GOWN DISPOSABLE) ×10
GOWN STRL REUS W/TWL XL LVL3 (GOWN DISPOSABLE) ×5
ILLUMINATOR WAVEGUIDE N/F (MISCELLANEOUS) IMPLANT
KIT MARKER MARGIN INK (KITS) ×8 IMPLANT
LIGHT WAVEGUIDE WIDE FLAT (MISCELLANEOUS) IMPLANT
NDL HYPO 25X1 1.5 SAFETY (NEEDLE) ×2 IMPLANT
NDL SAFETY ECLIPSE 18X1.5 (NEEDLE) IMPLANT
NEEDLE HYPO 18GX1.5 SHARP (NEEDLE)
NEEDLE HYPO 25X1 1.5 SAFETY (NEEDLE) ×10 IMPLANT
NS IRRIG 1000ML POUR BTL (IV SOLUTION) ×5 IMPLANT
PACK BASIN DAY SURGERY FS (CUSTOM PROCEDURE TRAY) ×5 IMPLANT
PENCIL BUTTON HOLSTER BLD 10FT (ELECTRODE) ×5 IMPLANT
PIN SAFETY STERILE (MISCELLANEOUS) IMPLANT
SHEET MEDIUM DRAPE 40X70 STRL (DRAPES) ×5 IMPLANT
SLEEVE SCD COMPRESS KNEE MED (MISCELLANEOUS) ×5 IMPLANT
SPONGE LAP 18X18 X RAY DECT (DISPOSABLE) ×8 IMPLANT
STRIP CLOSURE SKIN 1/2X4 (GAUZE/BANDAGES/DRESSINGS) IMPLANT
STRIP CLOSURE SKIN 1/4X4 (GAUZE/BANDAGES/DRESSINGS) IMPLANT
SUT MNCRL AB 4-0 PS2 18 (SUTURE) ×11 IMPLANT
SUT VICRYL 3-0 CR8 SH (SUTURE) ×11 IMPLANT
SYR CONTROL 10ML LL (SYRINGE) ×8 IMPLANT
TOWEL OR 17X24 6PK STRL BLUE (TOWEL DISPOSABLE) ×5 IMPLANT
TOWEL OR NON WOVEN STRL DISP B (DISPOSABLE) ×2 IMPLANT
TUBE CONNECTING 20'X1/4 (TUBING) ×1
TUBE CONNECTING 20X1/4 (TUBING) ×4 IMPLANT
YANKAUER SUCT BULB TIP NO VENT (SUCTIONS) ×5 IMPLANT

## 2016-10-24 NOTE — Anesthesia Preprocedure Evaluation (Signed)
Anesthesia Evaluation  Patient identified by MRN, date of birth, ID band Patient awake    Reviewed: Allergy & Precautions, NPO status , Patient's Chart, lab work & pertinent test results  History of Anesthesia Complications (+) PONV and history of anesthetic complications  Airway Mallampati: III  TM Distance: >3 FB Neck ROM: Full    Dental  (+) Teeth Intact, Dental Advisory Given   Pulmonary neg pulmonary ROS,    Pulmonary exam normal breath sounds clear to auscultation       Cardiovascular negative cardio ROS Normal cardiovascular exam Rhythm:Regular Rate:Normal     Neuro/Psych negative neurological ROS  negative psych ROS   GI/Hepatic negative GI ROS, Neg liver ROS, GERD  Medicated,  Endo/Other  Obesity   Renal/GU Renal diseasenegative Renal ROS     Musculoskeletal negative musculoskeletal ROS (+)   Abdominal   Peds  Hematology  (+) Blood dyscrasia, anemia ,   Anesthesia Other Findings Day of surgery medications reviewed with the patient.  Left breast cancer  Reproductive/Obstetrics negative OB ROS                             Anesthesia Physical  Anesthesia Plan  ASA: II  Anesthesia Plan: General   Post-op Pain Management: GA combined w/ Regional for post-op pain   Induction: Intravenous  PONV Risk Score and Plan: 4 or greater and Ondansetron, Dexamethasone, Midazolam, Scopolamine patch - Pre-op and Propofol infusion  Airway Management Planned: LMA  Additional Equipment:   Intra-op Plan:   Post-operative Plan: Extubation in OR  Informed Consent: I have reviewed the patients History and Physical, chart, labs and discussed the procedure including the risks, benefits and alternatives for the proposed anesthesia with the patient or authorized representative who has indicated his/her understanding and acceptance.   Dental advisory given  Plan Discussed with:  CRNA  Anesthesia Plan Comments:        Anesthesia Quick Evaluation

## 2016-10-24 NOTE — Anesthesia Postprocedure Evaluation (Signed)
Anesthesia Post Note  Patient: Susan Morrison  Procedure(s) Performed: Procedure(s) (LRB): RIGHT BREAST RADIOACTIVE SEED GUIDED EXCISIONAL BREAST BIOPSIES X'S 2, LEFT BREAST RADIOACTIVE SEED GUIDED EXCISIONAL BIOPSY X2 (Bilateral) RADIOACTIVE SEED GUIDED AXILLARY TARGETED SENTINEL LYMPH NODE DISSECTION (Left)     Patient location during evaluation: PACU Anesthesia Type: General Level of consciousness: sedated and patient cooperative Pain management: pain level controlled Vital Signs Assessment: post-procedure vital signs reviewed and stable Respiratory status: spontaneous breathing Cardiovascular status: stable Anesthetic complications: no    Last Vitals:  Vitals:   10/24/16 1615 10/24/16 1630  BP: 119/63 123/67  Pulse: 76 74  Resp: (!) 23 (!) 22  Temp:    SpO2: 96% 95%    Last Pain:  Vitals:   10/24/16 1630  TempSrc:   PainSc: [place]

## 2016-10-24 NOTE — Interval H&P Note (Signed)
History and Physical Interval Note:  10/24/2016 12:01 PM  Susan Morrison  has presented today for surgery, with the diagnosis of LEFT BREAST CANCER, LEFT BREAST PAPILLOMA, RIGHT BREAST COMPLEX SCLEROSING LESION X'S 2.  The various methods of treatment have been discussed with the patient and family.   Her husband is here with her today.  After consideration of risks, benefits and other options for treatment, the patient has consented to  Procedure(s): LEFT BREAST RADIOACTIVE SEED GUIDED LUMPECTOMY WITH  RADIOACTIVE SEED TARGETED LEFT AXILLARY LYMPH NODE EXCISION AND LEFT SENTINEL LYMPH NODE BIOPSY (Left) RIGHT BREAST RADIOACTIVE SEED GUIDED EXCISIONAL BREAST BIOPSIES X'S 2, LEFT BREAST RADIOACTIVE SEED GUIDED EXCISIONAL BIOPSY (Bilateral) as a surgical intervention .  The patient's history has been reviewed, patient examined, no change in status, stable for surgery.  I have reviewed the patient's chart and labs.  Questions were answered to the patient's satisfaction.     [name]

## 2016-10-24 NOTE — Anesthesia Procedure Notes (Signed)
Procedure Name: LMA Insertion Date/Time: 10/24/2016 12:19 PM Performed by: Rayvon Char Pre-anesthesia Checklist: Patient identified and Emergency Drugs available Patient Re-evaluated:Patient Re-evaluated prior to induction Oxygen Delivery Method: Circle system utilized Preoxygenation: Pre-oxygenation with 100% oxygen Induction Type: IV induction Ventilation: Mask ventilation without difficulty LMA: LMA inserted LMA Size: 4.0 Number of attempts: 1 Dental Injury: Teeth and Oropharynx as per pre-operative assessment

## 2016-10-24 NOTE — Anesthesia Procedure Notes (Signed)
Anesthesia Regional Block: Pectoralis block   Pre-Anesthetic Checklist: ,, timeout performed, Correct Patient, Correct Site, Correct Laterality, Correct Procedure, Correct Position, site marked, Risks and benefits discussed,  Surgical consent,  Pre-op evaluation,  At surgeon's request and post-op pain management  Laterality: Left  Prep: chloraprep       Needles:   Needle Type: Stimiplex     Needle Length: 9cm      Additional Needles:   Procedures:,,,, ultrasound used (permanent image in chart),,,,  Narrative:  Start time: 10/24/2016 11:22 AM End time: 10/24/2016 11:27 AM Injection made incrementally with aspirations every 5 mL.  Performed by: Personally  Anesthesiologist: Nolon Nations  Additional Notes: Patient tolerated well. Good fascial spread noted.

## 2016-10-24 NOTE — Transfer of Care (Signed)
Immediate Anesthesia Transfer of Care Note  Patient: Susan Morrison  Procedure(s) Performed: Procedure(s): RIGHT BREAST RADIOACTIVE SEED GUIDED EXCISIONAL BREAST BIOPSIES X'S 2, LEFT BREAST RADIOACTIVE SEED GUIDED EXCISIONAL BIOPSY X2 (Bilateral) RADIOACTIVE SEED GUIDED AXILLARY TARGETED SENTINEL LYMPH NODE DISSECTION (Left)  Patient Location: PACU  Anesthesia Type:General  Level of Consciousness: awake, alert  and oriented  Airway & Oxygen Therapy: Patient Spontanous Breathing and Patient connected to face mask oxygen  Post-op Assessment: Report given to RN  Post vital signs: Reviewed and stable  Last Vitals:  Vitals:   10/24/16 1130 10/24/16 1136  BP: 131/77 140/77  Pulse: 62 66  Resp: 18 19  Temp:    SpO2: 98% 97%    Last Pain:  Vitals:   10/24/16 1136  TempSrc:   PainSc: 0-No pain         Complications: No apparent anesthesia complications

## 2016-10-24 NOTE — Progress Notes (Signed)
Nuclear medicine at bedside. Time out was called.  Patient is alert and orientated x4.  She denies any pain.  Remained at bedside to offer support and comfort to patient.

## 2016-10-24 NOTE — Discharge Instructions (Signed)
[facility] - DISCHARGE INSTRUCTIONS TO PATIENT  Activity:  Driving - May drive in 2 or 3 days, if doing well   Lifting - No lifting more than 15 pounds for 1 week, then no limit  Wound Care:   Leave bandage for 2 days, then remove and shower  Diet:  As tolerated  Follow up appointment:  Call Dr. Pollie Friar office [facility]) at [phone] for an appointment in 2 to 3 weeks  Medications and dosages:  Resume your home medications.  You have a prescription for:  Vicodin  Call Dr. Lucia Gaskins or his office  [phone]) if you have:  Temperature greater than 100.4,  Persistent nausea and vomiting,  Severe uncontrolled pain,  Redness, tenderness, or signs of infection (pain, swelling, redness, odor or green/yellow discharge around the site),  Difficulty breathing, headache or visual disturbances,  Any other questions or concerns you may have after discharge.  In an emergency, call 911 or go to an Emergency Department at a nearby hospital.     Post Anesthesia Home Care Instructions  Activity: Get plenty of rest for the remainder of the day. A responsible individual must stay with you for 24 hours following the procedure.  For the next 24 hours, DO NOT: -Drive a car -Paediatric nurse -Drink alcoholic beverages -Take any medication unless instructed by your physician -Make any legal decisions or sign important papers.  Meals: Start with liquid foods such as gelatin or soup. Progress to regular foods as tolerated. Avoid greasy, spicy, heavy foods. If nausea and/or vomiting occur, drink only clear liquids until the nausea and/or vomiting subsides. Call your physician if vomiting continues.  Special Instructions/Symptoms: Your throat may feel dry or sore from the anesthesia or the breathing tube placed in your throat during surgery. If this causes discomfort, gargle with warm salt water. The discomfort should disappear within 24 hours.  If you had a  scopolamine patch placed behind your ear for the management of post- operative nausea and/or vomiting:  1. The medication in the patch is effective for 72 hours, after which it should be removed.  Wrap patch in a tissue and discard in the trash. Wash hands thoroughly with soap and water. 2. You may remove the patch earlier than 72 hours if you experience unpleasant side effects which may include dry mouth, dizziness or visual disturbances. 3. Avoid touching the patch. Wash your hands with soap and water after contact with the patch.

## 2016-10-24 NOTE — Progress Notes (Signed)
Assisted Dr. Germeroth with left, ultrasound guided, pectoralis block. Side rails up, monitors on throughout procedure. See vital signs in flow sheet. Tolerated Procedure well. 

## 2016-10-24 NOTE — Op Note (Signed)
10/24/2016  3:17 PM  PATIENT:  Susan Morrison DOB: 1970-08-07 MRN: [id number]  PREOP DIAGNOSIS:   LEFT BREAST CANCER, LEFT BREAST PAPILLOMA, RIGHT BREAST COMPLEX SCLEROSING LESION X'S 2  POSTOP DIAGNOSIS:    Left breast cancer, 7 o'clock position (T2, N1) Lesion left breast at 9:30 o'clock (Papilloma) Lesion right breast at 1:00 o'clock (Complex sclerosing lesion) Lesion right breast at 8:00 o'clock (Complex sclerosing lesion) Final pathology pending  PROCEDURE:   Procedure(s): RIGHT BREAST RADIOACTIVE SEED GUIDED EXCISIONAL BREAST BIOPSIES X'S 2,  LEFT BREAST RADIOACTIVE SEED GUIDED EXCISIONAL BIOPSY X2 RADIOACTIVE SEED GUIDED AXILLARY TARGETED SENTINEL LYMPH NODE DISSECTION,  Injection of left peri areolar area of breast with methylene blue (1.0 cc),  Left deep sentinel lymph node biopsy  SURGEON:   Alphonsa Overall, M.D.  ANESTHESIA:   general  Anesthesiologist: Nolon Nations, MD CRNA: Verita Lamb, CRNA; Rickelton, Levada Dy, CRNA  General  EBL:  100  ml  DRAINS:  none   LOCAL MEDICATIONS USED:   30 cc 1/4% marcaine  SPECIMEN:   Right breast lesion 8:00 o'clock Right breast lesion 1:00 o'clock Left breast lesion 9:30 o'clock Left breast lesion 7:00 o'clock (known cancer) Inferior margin of 7:00 o'clock excision Lateral margin of 7:00 o'clock exicision Left targeted axillary node dissection (hot - counts - 1,600, blue, and seed in specimen)  COUNTS CORRECT:  YES  INDICATIONS FOR PROCEDURE:  Susan Morrison is a 46 y.o. (DOB: 09/18/1970) AA female whose primary care physician is Nche, Charlene Brooke, NP and comes for right breast lumpectomy x 2, left breast lumpectomy x 2 and targeted left axillary sentinel lymph node biopsy.   Ms. Loschiavo presented to the [facility] in February, 2018.  She was found to have a left breast cancer, triple negative, in the 7:00 o'clock of the left breast.  She also had a positive left axillary lymph node.   She  underwent neoadjuvant chemotx supervised by Dr. Lindi Adie.   On MRI - she had 3 additional lesions - 1 o'clock right breast (complex sclerosing lesion) , 8 o'clock right breast (complex sclerosing lesion) , and 9:30 o'clock left breast (sclerosed intraductal papilloma).  These will be excised with the primary breast cancer..     The options for breast cancer treatment have been discussed with the patient. She elected to proceed with lumpectomy and axillary sentinel lymph node.     The indications and potential complications of surgery were explained to the patient. Potential complications include, but are not limited to, bleeding, infection, the need for further surgery, and nerve injury.     She had five I131 seed placed in her breasts at [facility] on 10/23/2016   The seeds were in the 1 and 8 o'clock position of the right breast, in the 7:30 and 9:30 o'clock position of the left breast and in the left axilla.   In the holding area, her left areola was injected with 1 millicurie of Technitium Sulfur Colloid.  OPERATIVE NOTE:   The patient was taken to room # 8 at [facility] where she underwent a general anesthesia  supervised by Anesthesiologist: Nolon Nations, MD CRNA: Verita Lamb, CRNA; Rayvon Char, Immunologist. Both breast and axilla were prepped with  ChloraPrep and sterilely draped.    A time-out and the surgical check list was reviewed.    I injected about 0.5 mL of 40% methylene blue around her left areola.   I first excised the lesion at the 8 o'clock position of  the right breast.   I used the Neoprobe to identify the I131 seed.  I tried to excise an area around the tumor of at least 1 cm.    I excised this block of breast tissue approximately 3 cm by 4 cm  in diameter.   I painted the lumpectomy specimen with the 6 color paint kit and did a specimen mammogram which confirmed the mass, clip, and the seed were all in the right position in the specimen.  The specimen was  sent to pathology who called back to confirm that they have the seed and the specimen.   I secondly excised the lesion at the 1 o'clock position of the right breast.   I used the Neoprobe to identify the I131 seed.  This lesion was just below the power port in the upper right breast.   I tried to excise an area around the tumor of at least 1 cm.    I excised this block of breast tissue approximately 3 cm by 3 cm  in diameter.   I painted the lumpectomy specimen with the 6 color paint kit and did a specimen mammogram which confirmed the mass, clip, and the seed were all in the right position in the specimen.  The specimen was sent to pathology who called back to confirm that they have the seed and the specimen.   I thirdly excised the lesion at the 9:30 o'clock position of the left breast.  The lesion was just lateral to the left areola.   I used the Neoprobe to identify the I131 seed.  I tried to excise an area around the tumor of at least 1 cm.    I excised this block of breast tissue approximately 3 cm by 4 cm  in diameter.   I painted the lumpectomy specimen with the 6 color paint kit and did a specimen mammogram which confirmed the mass, clip, and the seed were all in the right position in the specimen.  The specimen was sent to pathology who called back to confirm that they have the seed and the specimen.   I lastly excised the lesion at the 7 o'clock position of the left breast.  This is where her left breast cancer had been previously biopsied.   I used the Neoprobe to identify the I131 seed.  I tried to excise an area around the tumor of at least 1 cm.    I excised this block of breast tissue approximately 5 cm by 4 cm  in diameter.   I painted the lumpectomy specimen with the 6 color paint kit and did a specimen mammogram which confirmed the mass, clip, and the seed were all in the right position in the specimen.  The specimen was sent to pathology who called back to confirm that they have the seed  and the specimen.   I thought the lumpectomy was close to the inferior and lateral margin - so I took additional inferior and medial margins and painted and labeled this tissue as margins.   I then started the left deep axillary targeted lymph node biopsy. I made an incision in the left axilla.  I found a hot area at the junction of the breast and the pectoralis major muscle, deep in the axilla.  I also found the seed in the left axilla.   I cut down and  identified a hot node that had counts of 1,600 and the background has 30 counts. The lymph node was blue.  I checked her internal mammary nodes and supraclavicular nodes with the neoprobe and found no other hot area. The specimen was scanned in the faxitron - confirming the seed and clip were in the specimen.  The axillary nodes were then sent to pathology.    I then irrigated the wounds with saline. She had a left pectoral block by anesthesia.   I infiltrated approximately 30 mL of 1/4% Marcaine between the incisions. I placed 4 clips to mark left breast biopsy cavity (at 7 o'clock), with clips at 12, 3, 6, and 9 o'clock.  I then closed all the wounds in layers using 3-0 Vicryl sutures for the deep layer. At the skin, I closed the incisions with a 4-0 Monocryl suture. The incisions were then painted with Dermabond.  She had gauze place over the wounds and placed in a breast binder.   The patient tolerated the procedure well, was transported to the recovery room in good condition. Sponge and needle count were correct at the end of the case.   Final pathology is pending.   Alphonsa Overall, MD, [facility] Pager: [phone] Office phone:  [phone]

## 2016-10-25 ENCOUNTER — Encounter (HOSPITAL_BASED_OUTPATIENT_CLINIC_OR_DEPARTMENT_OTHER): Payer: Self-pay | Admitting: Surgery

## 2016-10-25 NOTE — Addendum Note (Signed)
Addendum  created 10/25/16 1115 by Tawni Millers, CRNA   Charge Capture section accepted

## 2016-10-31 ENCOUNTER — Ambulatory Visit (HOSPITAL_BASED_OUTPATIENT_CLINIC_OR_DEPARTMENT_OTHER): Payer: Medicaid Other | Admitting: Hematology and Oncology

## 2016-10-31 ENCOUNTER — Telehealth: Payer: Self-pay | Admitting: Hematology and Oncology

## 2016-10-31 DIAGNOSIS — D6481 Anemia due to antineoplastic chemotherapy: Secondary | ICD-10-CM

## 2016-10-31 DIAGNOSIS — C773 Secondary and unspecified malignant neoplasm of axilla and upper limb lymph nodes: Secondary | ICD-10-CM | POA: Diagnosis not present

## 2016-10-31 DIAGNOSIS — Z171 Estrogen receptor negative status [ER-]: Secondary | ICD-10-CM | POA: Diagnosis not present

## 2016-10-31 DIAGNOSIS — C50312 Malignant neoplasm of lower-inner quadrant of left female breast: Secondary | ICD-10-CM

## 2016-10-31 DIAGNOSIS — G620 Drug-induced polyneuropathy: Secondary | ICD-10-CM

## 2016-10-31 NOTE — Assessment & Plan Note (Signed)
Treatment summary: 1. 03/22/2016: Left breast biopsy 8:00: IDC high-grade with necrosis, left axillary lymph node biopsy IDC, ER 0%, PR 0%, HER-2 negative ratio 1.42, Ki-67 90%; palpable lump 3.5 x 2.6 x 2.2 cm and 2.8 Center left axillary lymph node, T2 N1 stage IIB 2. Neoadjuvant chemotherapy with Adriamycin and Cytoxan dose dense 4 followed by Taxolweekly 12 completed 08/30/2016 2. 10/24/2016 Right lumpectomy: No malignancy identified. Complete pathologic response. 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 Chemotherapy-induced anemia  Left hydronephrosis with chronic ureteropelvic obstruction follows with urology   

## 2016-10-31 NOTE — Progress Notes (Signed)
Patient Care Team: Nche, Charlene Brooke, NP as PCP - General (Internal Medicine) Alphonsa Overall, MD as Consulting Physician (General Surgery) Nicholas Lose, MD as Consulting Physician (Hematology and Oncology) Eppie Gibson, MD as Attending Physician (Radiation Oncology) Gardenia Phlegm, NP as Nurse Practitioner (Hematology and Oncology)  DIAGNOSIS:  Encounter Diagnosis  Name Primary?  . Malignant neoplasm of lower-inner quadrant of left breast in female, estrogen receptor negative ([place])     SUMMARY OF ONCOLOGIC HISTORY:   Malignant neoplasm of lower-inner quadrant of left breast in female, estrogen receptor negative ([place])   03/22/2016 Initial Diagnosis    Left breast biopsy 8:00: IDC high-grade with necrosis, left axillary lymph node biopsy IDC, ER 0%, PR 0%, HER-2 negative ratio 1.42, Ki-67 90%; palpable lump 3.5 x 2.6 x 2.2 cm and 2.8 Center left axillary lymph node, T2 N1 stage IIB      04/10/2016 Initial Biopsy    Additional biopsies left breast 9:30: Sclerosed intraductal papilloma with every 3 hours, right breast 1:00: Complex sclerosing lesion with UDH, right breast 8:00: Complex sclerosing lesion with UDH      04/17/2016 - 08/28/2016 Neo-Adjuvant Chemotherapy    Dose dense Adriamycin and Cytoxan 4 followed by Taxol weekly 12      05/15/2016 Genetic Testing    Genetic counseling and testing for hereditary cancer syndromes performed on 05/15/2016. Results are negative for pathogenic mutations in 46 genes analyzed by [facility]'s Common Hereditary Cancers Panel. Results are dated 05/24/2016. Genes tested: APC, ATM, AXIN2, BARD1, BMPR1A, BRCA1, BRCA2, BRIP1, CDH1, CDKN2A, CHEK2, CTNNA1, DICER1, EPCAM, GREM1, HOXB13, KIT, MEN1, MLH1, MSH2, MSH3, MSH6, MUTYH, NBN, NF1, NTHL1, PALB2, PDGFRA, PMS2, POLD1, POLE, PTEN, RAD50, RAD51C, RAD51D, SDHA, SDHB, SDHC, SDHD, SMAD4, SMARCA4, STK11, TP53, TSC1, TSC2, and VHL.  A variant of uncertain significance (not clinically actionable) was  noted in MUTYH.         10/24/2016 Surgery    Right lumpectomy: No malignancy identified. Complete pathologic response. 0/4 lymph nodes negative, previously triple negative disease with a Ki-67 of 90%       CHIEF COMPLIANT:  Follow-up after right lumpectomy  INTERVAL HISTORY: Susan Morrison is a  46 year old above-mentioned history of triple negative left breast cancer underwent neoadjuvant chemotherapy followed by lumpectomy. She had complete pathologic response. There is no evidence of disease in the final specimen.She is recovering very well from the surgery.  REVIEW OF SYSTEMS:   Constitutional: Denies fevers, chills or abnormal weight loss Eyes: Denies blurriness of vision Ears, nose, mouth, throat, and face: Denies mucositis or sore throat Respiratory: Denies cough, dyspnea or wheezes Cardiovascular: Denies palpitation, chest discomfort Gastrointestinal:  Denies nausea, heartburn or change in bowel habits Skin: Denies abnormal skin rashes Lymphatics: Denies new lymphadenopathy or easy bruising Neurological:Denies numbness, tingling or new weaknesses Behavioral/Psych: Mood is stable, no new changes  Extremities: No lower extremity edema Breast:  Recent left lumpectomy All other systems were reviewed with the patient and are negative.  I have reviewed the past medical history, past surgical history, social history and family history with the patient and they are unchanged from previous note.  ALLERGIES:  has No Known Allergies.  MEDICATIONS:  Current Outpatient Prescriptions  Medication Sig Dispense Refill  . acetaminophen (TYLENOL) 500 MG tablet Take 1,000 mg by mouth every 6 (six) hours as needed for moderate pain.    . cycloSPORINE (RESTASIS) 0.05 % ophthalmic emulsion Place 1 drop into both eyes 2 (two) times daily. 1 each 1  . goserelin (ZOLADEX) 3.6  MG injection Inject 3.6 mg into the skin every 28 (twenty-eight) days.    Marland Kitchen HYDROcodone-acetaminophen (NORCO/VICODIN)  5-325 MG tablet Take 1-2 tablets by mouth every 6 (six) hours as needed for moderate pain. 20 tablet 0  . lidocaine-prilocaine (EMLA) cream Apply to affected area once (Patient taking differently: Apply 1 application topically daily as needed (chemo treatments). Apply to affected area once) 30 g 3  . meloxicam (MOBIC) 15 MG tablet Take 1 tablet (15 mg total) by mouth 2 (two) times daily. 60 tablet 3  . Multiple Vitamins-Minerals (WOMENS MULTI VITAMIN & MINERAL) TABS Take 1 tablet by mouth daily after breakfast. 30 tablet 3   No current facility-administered medications for this visit.     PHYSICAL EXAMINATION: ECOG PERFORMANCE STATUS: 1 - Symptomatic but completely ambulatory  Vitals:   10/31/16 1109  BP: 120/81  Pulse: 71  Resp: 18  Temp: 98.2 F (36.8 C)  SpO2: 99%   Filed Weights   10/31/16 1109  Weight: 219 lb 3.2 oz (99.4 kg)    GENERAL:alert, no distress and comfortable SKIN: skin color, texture, turgor are normal, no rashes or significant lesions EYES: normal, Conjunctiva are pink and non-injected, sclera clear OROPHARYNX:no exudate, no erythema and lips, buccal mucosa, and tongue normal  NECK: supple, thyroid normal size, non-tender, without nodularity LYMPH:  no palpable lymphadenopathy in the cervical, axillary or inguinal LUNGS: clear to auscultation and percussion with normal breathing effort HEART: regular rate & rhythm and no murmurs and no lower extremity edema ABDOMEN:abdomen soft, non-tender and normal bowel sounds MUSCULOSKELETAL:no cyanosis of digits and no clubbing  NEURO: alert & oriented x 3 with fluent speech, no focal motor/sensory deficits EXTREMITIES: No lower extremity edema   LABORATORY DATA:  I have reviewed the data as listed   Chemistry      Component Value Date/Time   NA 139 08/28/2016 0908   K 4.0 08/28/2016 0908   CL 108 05/15/2016 1147   CO2 23 08/28/2016 0908   BUN 8.0 08/28/2016 0908   CREATININE 0.7 08/28/2016 0908        Component Value Date/Time   CALCIUM 9.8 08/28/2016 0908   ALKPHOS 75 08/28/2016 0908   AST 31 08/28/2016 0908   ALT 40 08/28/2016 0908   BILITOT 0.68 08/28/2016 0908       Lab Results  Component Value Date   WBC 4.1 08/28/2016   HGB 12.4 08/28/2016   HCT 36.5 08/28/2016   MCV 75.3 (L) 08/28/2016   PLT 241 08/28/2016   NEUTROABS 2.5 08/28/2016    ASSESSMENT & PLAN:  Malignant neoplasm of lower-inner quadrant of left breast in female, estrogen receptor negative ([place]) Treatment summary: 1. 03/22/2016: Left breast biopsy 8:00: IDC high-grade with necrosis, left axillary lymph node biopsy IDC, ER 0%, PR 0%, HER-2 negative ratio 1.42, Ki-67 90%; palpable lump 3.5 x 2.6 x 2.2 cm and 2.8 Center left axillary lymph node, T2 N1 stage IIB 2. Neoadjuvant chemotherapy with Adriamycin and Cytoxan dose dense 4 followed by Taxolweekly 12  completed 08/30/2016 2. 10/24/2016 Right lumpectomy: No malignancy identified. Complete pathologic response. 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 Chemotherapy-induced anemia  Left hydronephrosis with chronic ureteropelvic obstruction follows with urology I requested Dr.Newman to remove port RTC 6 months for surveillance   I spent 25 minutes talking to the patient of which more than half  was spent in counseling and coordination of care.  No orders of the defined types were placed in this encounter.  The patient has a good understanding of the overall plan. she agrees with it. she will call with any problems that may develop before the next visit here.   Rulon Eisenmenger, MD 10/31/16

## 2016-10-31 NOTE — Telephone Encounter (Signed)
Gave patient avs and calendar with appts per 89/27 los.

## 2016-11-01 NOTE — Progress Notes (Signed)
Location of Breast Cancer: lower-inner quadrant of left breast    Histology per Pathology Report:   10/24/16 Diagnosis 1. Breast, lumpectomy, Right - DUCTAL PAPILLOMA WITH SCLEROSIS. - COMPLEX SCLEROSING LESION WITH CALCIFICATIONS. - FIBROCYTIC CHANGES WITH USUAL DUCTAL HYPERPLASIA. - NO MALIGNANCY IDENTIFIED. 2. Breast, lumpectomy, Right - COMPLEX SCLEROSING LESION WITH USUAL DUCTAL HYPERPLASIA. - FIBROCYSTIC CHANGES WITH USUAL DUCTAL HYPERPLASIA. - PSEUDOANGIOMATOUS STROMAL HYPERPLASIA ([place]). - NO MALIGNANCY IDENTIFIED. 3. Breast, lumpectomy, Left w/seed - DUCTAL PAPILLOMA WITH SCLEROSIS AND USUAL DUCTAL HYPERPLASIA. - FIBROCYSTIC CHANGES WITH CALCIFICATIONS, USUAL DUCTAL HYPERPLASIA AND SCLEROSING ADENOSIS. - NO MALIGNANCY IDENTIFIED. 4. Breast, lumpectomy, Left w/seed - NUMEROUS MACROPHAGES AND FIBROSIS. - FIBROCYSTIC CHANGES. - PREVIOUS BIOPSY SITE. - NO RESIDUAL CARCINOMA IDENTIFIED. 5. Breast, excision, Left additional inferior margin - BENIGN FIBROADIPOSE TISSUE. - NO EVIDENCE OF MALIGNANCY. - FINAL INFERIOR MARGIN CLEAR. 6. Breast, excision, Left additional lateral margin - BENIGN ADIPOSE TISSUE AND SKELETAL MUSCLE. - NO MALIGNANCY IDENTIFIED. - FINAL LATERAL MARGIN CLEAR. 7. Lymph nodes, regional resection, Left Axillary w/seed - FOUR BENIGN LYMPH NODES, ONE WITH MACROPHAGES, FIBROSIS AND BIOPSY SITE. - NO MALIGNANCY IDENTIFIED.  04/10/16 Diagnosis 1. Breast, left, needle core biopsy, 9:30 o'clock - SCLEROSED INTRADUCTAL PAPILLOMA WITH USUAL DUCTAL HYPERPLASIA. - SEE COMMENT. 2. Breast, right, needle core biopsy, 1:00 o'clock - COMPLEX SCLEROSING LESION WITH USUAL DUCTAL HYPERPLASIA. - SEE COMMENT. 3. Breast, right, needle core biopsy, 8:00 o'clock - COMPLEX SCLEROSING LESION WITH USUAL DUCTAL HYPERPLASIA. - SEE COMMENT.  03/22/16 Diagnosis 1. Breast, left, needle core biopsy, 8:00 o'clock - INVASIVE DUCTAL CARCINOMA, HIGH GRADE, WITH NECROSIS. 2. Lymph  node, needle/core biopsy, left axilla - INVASIVE DUCTAL CARCINOMA METASTATIC TO LYMPH NODE.  Receptor Status: ER(0%), PR (0%), Her2-neu (negative), Ki-(90%)  Did patient present with symptoms (if so, please note symptoms) or was this found on screening mammography?: patient felt a lump herself in her left breast  Past/Anticipated interventions by surgeon, if any: 10/24/16 -  Procedure: RIGHT BREAST RADIOACTIVE SEED GUIDED EXCISIONAL BREAST BIOPSIES X'S 2, LEFT BREAST RADIOACTIVE SEED GUIDED EXCISIONAL BIOPSY X2 and Procedure: RADIOACTIVE SEED [place];  Surgeon: Alphonsa Overall, MD  Past/Anticipated interventions by medical oncology, if any: Dose dense Adriamycin and Cytoxan 4 followed by Taxol weekly 12 completed 08/30/2016.   Lymphedema issues, if any: no   Pain issues, if any:  no   SAFETY ISSUES:  Prior radiation? no  Pacemaker/ICD? no  Possible current pregnancy?no  Is the patient on methotrexate? no  Current Complaints / other details:  Patient is here with her husband.  BP 110/80 (BP Location: Right Arm, Patient Position: Sitting)   Pulse 66   Temp 98.5 F (36.9 C) (Oral)   Ht 5' 6.5" (1.689 m)   Wt 219 lb (99.3 kg)   LMP 05/05/2016 (Approximate)   SpO2 98%   BMI 34.82 kg/m    Wt Readings from Last 3 Encounters:  11/14/16 219 lb (99.3 kg)  10/31/16 219 lb 3.2 oz (99.4 kg)  10/24/16 221 lb (100.2 kg)      Jacqulyn Liner, RN 11/01/2016,8:05 AM

## 2016-11-06 ENCOUNTER — Encounter: Payer: Self-pay | Admitting: Physical Therapy

## 2016-11-06 ENCOUNTER — Other Ambulatory Visit: Payer: Self-pay | Admitting: *Deleted

## 2016-11-06 DIAGNOSIS — Z171 Estrogen receptor negative status [ER-]: Principal | ICD-10-CM

## 2016-11-06 DIAGNOSIS — C50312 Malignant neoplasm of lower-inner quadrant of left female breast: Secondary | ICD-10-CM

## 2016-11-13 ENCOUNTER — Ambulatory Visit: Payer: Self-pay | Admitting: Physical Therapy

## 2016-11-13 ENCOUNTER — Ambulatory Visit: Payer: Medicaid Other | Attending: Hematology and Oncology | Admitting: Physical Therapy

## 2016-11-13 DIAGNOSIS — M25612 Stiffness of left shoulder, not elsewhere classified: Secondary | ICD-10-CM | POA: Diagnosis not present

## 2016-11-13 DIAGNOSIS — Z483 Aftercare following surgery for neoplasm: Secondary | ICD-10-CM | POA: Insufficient documentation

## 2016-11-13 DIAGNOSIS — M79622 Pain in left upper arm: Secondary | ICD-10-CM | POA: Diagnosis present

## 2016-11-13 NOTE — Therapy (Addendum)
[place], [place], [address] Phone: [phone]   Fax:  [phone]  Physical Therapy Evaluation  Patient Details  Name: Susan Morrison MRN: [id number] Date of Birth: Oct 14, 1970 Referring Provider: Dr. Nicholas Lose  Encounter Date: 11/13/2016      PT End of Session - 11/13/16 1037    Visit Number 1   Number of Visits 4   Date for PT Re-Evaluation 12/20/16   PT Start Time 0933   PT Stop Time 1018   PT Time Calculation (min) 45 min   Activity Tolerance Patient tolerated treatment well   Behavior During Therapy [facility] for tasks assessed/performed      Past Medical History:  Diagnosis Date  . Anemia   . Breast mass 03/21/2016   left 7:00  . Cancer ([place])    left brest cancer   . Genetic testing 06/03/2016   Ms. Ojeda underwent genetic counseling and testing for hereditary cancer syndromes on 05/15/2016. Her results were negative for mutations in all 46 genes analyzed by [facility]'s 46-gene Common Hereditary Cancers Panel. Genes analyzed include: APC, ATM, AXIN2, BARD1, BMPR1A, BRCA1, BRCA2, BRIP1, CDH1, CDKN2A, CHEK2, CTNNA1, DICER1, EPCAM, GREM1, HOXB13, KIT, MEN1, MLH1, MSH2, MSH3, MSH6, MUTYH, NBN,  . PONV (postoperative nausea and vomiting)     Past Surgical History:  Procedure Laterality Date  . DILATION AND CURETTAGE OF UTERUS     last done 2years ago  . PORTACATH PLACEMENT N/A 04/15/2016   Procedure: INSERTION PORT-A-CATH WITH [place];  Surgeon: Alphonsa Overall, MD;  Location: [facility];  Service: General;  Laterality: N/A;  . RADIOACTIVE SEED GUIDED AXILLARY SENTINEL LYMPH NODE Left 10/24/2016   Procedure: RADIOACTIVE SEED GUIDED AXILLARY TARGETED SENTINEL LYMPH NODE DISSECTION;  Surgeon: Alphonsa Overall, MD;  Location: [facility];  Service: General;  Laterality: Left;  . RADIOACTIVE SEED GUIDED EXCISIONAL BREAST BIOPSY Bilateral 10/24/2016   Procedure: RIGHT BREAST RADIOACTIVE SEED GUIDED  EXCISIONAL BREAST BIOPSIES X'S 2, LEFT BREAST RADIOACTIVE SEED GUIDED EXCISIONAL BIOPSY X2;  Surgeon: Alphonsa Overall, MD;  Location: [facility];  Service: General;  Laterality: Bilateral;  . TUBAL LIGATION  1996    There were no vitals filed for this visit.       Subjective Assessment - 11/13/16 0934    Subjective I have like a burning sensation in the back ofmy arm.  I couldn't get my arm all the way up but now I can.  When I sleep at night I can only sleep in certain positions because my arm will start hurting again.   Pertinent History Susan Morrison is a  46 year old above-mentioned history of triple negative left breast cancer underwent neoadjuvant chemotherapy followed by lumpectomy 10/24/16. Removed 3 lymph nodes on the left and 2 on the right. She had complete pathologic response. There is no evidence of disease in the final specimen.She is recovering very well from the surgery. No other health issues. Has radiation simulation tomorrow and will start four weeks of treatment after that.   Patient Stated Goals be able to use my left arm normally   Currently in Pain? Yes   Pain Score 0-No pain   Pain Location Arm   Pain Orientation Left;Posterior;Upper   Pain Descriptors / Indicators Numbness;Burning   Aggravating Factors  certain sleeping positions (sleeping on RIGHT side); pulling pants down   Pain Relieving Factors lifting arm or putting a pillow up under it  [facility] PT Assessment - 11/13/16 0001      Assessment   Medical Diagnosis triple negative left breast cancer   Referring Provider Dr. Nicholas Lose   Onset Date/Surgical Date 10/24/16   Hand Dominance Right   Prior Therapy none     Precautions   Precautions Other (comment)   Precaution Comments cancer precautions     Restrictions   Weight Bearing Restrictions No     Balance Screen   Has the patient fallen in the past 6 months No   Has the patient had a decrease in activity level because  of a fear of falling?  No   Is the patient reluctant to leave their home because of a fear of falling?  No     Home Ec[employer] residence   Living Arrangements Spouse/significant other   Type of [place] One level     Prior Function   Level of [place] Unemployed   Leisure goes to the gym twice a week: does treadmill and bicycle, but doesn't use her arm and does each for 30 minutes; in the past has done chest press and ball against wall tosses     Cognition   Overall Cognitive Status Within Functional Limits for tasks assessed     Observation/Other Assessments   Observations patient with 3 incisions on left and 2 on right (+Portacath), all healed and looking healthy   Quick DASH  36.36     ROM / Strength   AROM / PROM / Strength AROM     AROM   Overall AROM Comments both shoulder A/ROM WFL, though patient report tight feeling on left side at end range   AROM Assessment Site Shoulder   Right/Left Shoulder Right;Left   Right Shoulder Flexion 150 Degrees   Right Shoulder ABduction 174 Degrees   Right Shoulder Internal Rotation 77 Degrees   Right Shoulder External Rotation 80 Degrees   Left Shoulder Flexion 150 Degrees  gets burning at end range of passive   Left Shoulder ABduction 153 Degrees  gets burning at end range of passive   Left Shoulder Internal Rotation 90 Degrees   Left Shoulder External Rotation 70 Degrees     Ambulation/Gait   Ambulation/Gait Yes   Ambulation/Gait Assistance 7: Independent  per patient report     Functional Gait  Assessment   Gait assessed  No           LYMPHEDEMA/ONCOLOGY QUESTIONNAIRE - 11/13/16 0950      Type   Cancer Type left breast triple negative     Surgeries   Lumpectomy Date 10/24/16   Number Lymph Nodes Removed 5     Treatment   Past Chemotherapy Treatment Yes   Date 08/28/16   Active Radiation Treatment No  will have sim on 11/14/16      Lymphedema Assessments   Lymphedema Assessments Upper extremities     Right Upper Extremity Lymphedema   10 cm Proximal to Olecranon Process 37.3 cm   Olecranon Process 29.2 cm   10 cm Proximal to Ulnar Styloid Process 26 cm   Just Proximal to Ulnar Styloid Process 19.3 cm   Across Hand at [employer] 20.7 cm   At [place] of 2nd Digit 6.8 cm     Left Upper Extremity Lymphedema   10 cm Proximal to Olecranon Process 37.3 cm   Olecranon Process 28.8 cm   10 cm Proximal to Ulnar Styloid Process  26.4 cm   Just Proximal to Ulnar Styloid Process 18.8 cm   Across Hand at [employer] 19.8 cm   At [place] of 2nd Digit 6.8 cm           Katina Dung - 11/13/16 0001    Open a tight or new jar Moderate difficulty   Do heavy household chores (wash walls, wash floors) Mild difficulty   Carry a shopping bag or briefcase No difficulty   Wash your back Moderate difficulty   Use a knife to cut food No difficulty   Recreational activities in which you take some force or impact through your arm, shoulder, or hand (golf, hammering, tennis) Mild difficulty   During the past week, to what extent has your arm, shoulder or hand problem interfered with your normal social activities with family, friends, neighbors, or groups? Modererately   During the past week, to what extent has your arm, shoulder or hand problem limited your work or other regular daily activities Slightly   Arm, shoulder, or hand pain. Mild   Tingling (pins and needles) in your arm, shoulder, or hand Severe   Difficulty Sleeping Severe difficulty   DASH Score 36.36 %      Objective measurements completed on examination: See above findings.                  PT Education - 11/13/16 1037    Education provided Yes   Education Details about ABC class   Person(s) Educated Patient   Methods Explanation;Handout   Comprehension Verbalized understanding                [facility] Goals - 11/13/16 1044       CC Long Term Goal  #1   Title Patient will report pain in left upper arm has stopped waking her up at night.   Baseline Pain wakes her up if she sleeps on right side.   Time 4   Period Weeks   Status New   Target Date 12/20/16     CC Long Term Goal  #2   Title Left shoulder active abduction to at least 170 degrees for improved overhead reach.   Baseline 153 at eval compared to 174 on right   Time 4   Period Weeks   Status New   Target Date 12/20/16     CC Long Term Goal  #3   Title Left shoulder active er to at least 67 degrees for improved ADLs.   Baseline 70 at eval compared to 80 on right   Time 4   Period Weeks   Status New   Target Date 12/20/16     CC Long Term Goal  #4   Title Independent with HEP for stretching left shoulder area.   Baseline Limited knowledge   Time 4   Period Weeks   Status New   Target Date 12/20/16             Plan - 11/13/16 1038    Clinical Impression Statement Pleasant young woman s/p neoadjuvant chemotherapy and lumpectomy for left triple negative breast cancer.  She had complete pathologic response to chemo.  Five lymph nodes were removed, one axillary and two at each breast; she therefore has multiple small incision sites. Her A/ROM is limited on left compared to right and she has burning pain with movement into end range as well as with sleeping on her opposite side (right side). She is to have simulation for  radiation tomorrow, and will have four weeks of treatments.   History and Personal Factors relevant to plan of care: none   Clinical Presentation Evolving   Clinical Presentation due to: in mid-treatment for breast cancer   Clinical Decision Making Low   Rehab Potential Excellent   Clinical Impairments Affecting Rehab Potential recent (10/24/16) surgery with multiple incisions   PT Frequency --  3 visits   PT Duration 4 weeks   PT Treatment/Interventions ADLs/Self Care Home Management;Therapeutic exercise;Patient/family  education;Manual techniques;Scar mobilization;Passive range of motion;Manual lymph drainage;Taping   PT Next Visit Plan P/AA/AROM to left shoulder; myofascial release to left upper quadrant; progress HEP   PT Home Exercise Plan continue four stretches she was shown by the nurse   Recommended Other Services ABC class suggested   Consulted and Agree with Plan of Care Patient      Patient will benefit from skilled therapeutic intervention in order to improve the following deficits and impairments:  Pain, Decreased range of motion, Impaired UE functional use  Visit Diagnosis: Stiffness of left shoulder, not elsewhere classified - Plan: PT plan of care cert/re-cert  Pain in left upper arm - Plan: PT plan of care cert/re-cert  Aftercare following surgery for neoplasm - Plan: PT plan of care cert/re-cert     Problem List Patient Active Problem List   Diagnosis Date Noted  . Port catheter in place 06/11/2016  . Genetic testing 06/03/2016  . Keratoconjunctivitis of both eyes 06/03/2016  . UPJ obstruction, congenital 05/22/2016  . Iron deficiency anemia due to chronic blood loss 04/03/2016  . Malignant neoplasm of lower-inner quadrant of left breast in female, estrogen receptor negative ([place]) 03/26/2016  . Menorrhagia with regular cycle 03/05/2016  . Anemia 03/05/2016  . Left breast mass 03/05/2016  . Elevated liver enzymes 03/05/2016    Max Romano 11/13/2016, 12:12 PM  [place], [place], [address] Phone: [phone]   Fax:  [phone]  Name: LATRIA MCCARRON MRN: [id number] Date of Birth: 11-10-1970  Serafina Royals, PT 11/13/16 12:12 PM

## 2016-11-14 ENCOUNTER — Ambulatory Visit
Admission: RE | Admit: 2016-11-14 | Discharge: 2016-11-14 | Disposition: A | Discharge status: Home / Self Care | Payer: Medicaid Other | Source: Ambulatory Visit | Attending: Radiation Oncology | Admitting: Radiation Oncology

## 2016-11-14 ENCOUNTER — Ambulatory Visit (HOSPITAL_BASED_OUTPATIENT_CLINIC_OR_DEPARTMENT_OTHER): Payer: Medicaid Other

## 2016-11-14 ENCOUNTER — Encounter: Payer: Self-pay | Admitting: Radiation Oncology

## 2016-11-14 DIAGNOSIS — Z8249 Family history of ischemic heart disease and other diseases of the circulatory system: Secondary | ICD-10-CM | POA: Insufficient documentation

## 2016-11-14 DIAGNOSIS — C50312 Malignant neoplasm of lower-inner quadrant of left female breast: Secondary | ICD-10-CM

## 2016-11-14 DIAGNOSIS — Z171 Estrogen receptor negative status [ER-]: Secondary | ICD-10-CM | POA: Diagnosis not present

## 2016-11-14 DIAGNOSIS — Z5111 Encounter for antineoplastic chemotherapy: Secondary | ICD-10-CM

## 2016-11-14 DIAGNOSIS — Z510 Encounter for antineoplastic radiation therapy: Secondary | ICD-10-CM | POA: Insufficient documentation

## 2016-11-14 DIAGNOSIS — R200 Anesthesia of skin: Secondary | ICD-10-CM | POA: Diagnosis not present

## 2016-11-14 DIAGNOSIS — Z791 Long term (current) use of non-steroidal anti-inflammatories (NSAID): Secondary | ICD-10-CM | POA: Diagnosis not present

## 2016-11-14 DIAGNOSIS — Z95828 Presence of other vascular implants and grafts: Secondary | ICD-10-CM

## 2016-11-14 DIAGNOSIS — Z79899 Other long term (current) drug therapy: Secondary | ICD-10-CM | POA: Diagnosis not present

## 2016-11-14 DIAGNOSIS — D500 Iron deficiency anemia secondary to blood loss (chronic): Secondary | ICD-10-CM

## 2016-11-14 DIAGNOSIS — Z9221 Personal history of antineoplastic chemotherapy: Secondary | ICD-10-CM | POA: Insufficient documentation

## 2016-11-14 DIAGNOSIS — Z833 Family history of diabetes mellitus: Secondary | ICD-10-CM | POA: Diagnosis not present

## 2016-11-14 DIAGNOSIS — D649 Anemia, unspecified: Secondary | ICD-10-CM | POA: Diagnosis not present

## 2016-11-14 DIAGNOSIS — C773 Secondary and unspecified malignant neoplasm of axilla and upper limb lymph nodes: Secondary | ICD-10-CM

## 2016-11-14 MED ORDER — GOSERELIN ACETATE 3.6 MG ~~LOC~~ IMPL
3.6000 mg | DRUG_IMPLANT | Freq: Once | SUBCUTANEOUS | Status: AC
  Administered 2016-11-14: 3.6 mg via SUBCUTANEOUS
  Filled 2016-11-14: qty 3.6

## 2016-11-14 NOTE — Patient Instructions (Signed)
Goserelin injection What is this medicine? GOSERELIN (GOE se rel in) is similar to a hormone found in the body. It lowers the amount of sex hormones that the body makes. Men will have lower testosterone levels and women will have lower estrogen levels while taking this medicine. In men, this medicine is used to treat prostate cancer; the injection is either given once per month or once every 12 weeks. A once per month injection (only) is used to treat women with endometriosis, dysfunctional uterine bleeding, or advanced breast cancer. This medicine may be used for other purposes; ask your health care provider or pharmacist if you have questions. COMMON BRAND NAME(S): Zoladex What should I tell my health care provider before I take this medicine? They need to know if you have any of these conditions (some only apply to women): -diabetes -heart disease or previous heart attack -high blood pressure -high cholesterol -kidney disease -osteoporosis or low bone density -problems passing urine -spinal cord injury -stroke -tobacco smoker -an unusual or allergic reaction to goserelin, hormone therapy, other medicines, foods, dyes, or preservatives -pregnant or trying to get pregnant -breast-feeding How should I use this medicine? This medicine is for injection under the skin. It is given by a health care professional in a hospital or clinic setting. Men receive this injection once every 4 weeks or once every 12 weeks. Women will only receive the once every 4 weeks injection. Talk to your pediatrician regarding the use of this medicine in children. Special care may be needed. Overdosage: If you think you have taken too much of this medicine contact a poison control center or emergency room at once. NOTE: This medicine is only for you. Do not share this medicine with others. What if I miss a dose? It is important not to miss your dose. Call your doctor or health care professional if you are unable to  keep an appointment. What may interact with this medicine? -female hormones like estrogen -herbal or dietary supplements like black cohosh, chasteberry, or DHEA -female hormones like testosterone -prasterone This list may not describe all possible interactions. Give your health care provider a list of all the medicines, herbs, non-prescription drugs, or dietary supplements you use. Also tell them if you smoke, drink alcohol, or use illegal drugs. Some items may interact with your medicine. What should I watch for while using this medicine? Visit your doctor or health care professional for regular checks on your progress. Your symptoms may appear to get worse during the first weeks of this therapy. Tell your doctor or healthcare professional if your symptoms do not start to get better or if they get worse after this time. Your bones may get weaker if you take this medicine for a long time. If you smoke or frequently drink alcohol you may increase your risk of bone loss. A family history of osteoporosis, chronic use of drugs for seizures (convulsions), or corticosteroids can also increase your risk of bone loss. Talk to your doctor about how to keep your bones strong. This medicine should stop regular monthly menstration in women. Tell your doctor if you continue to menstrate. Women should not become pregnant while taking this medicine or for 12 weeks after stopping this medicine. Women should inform their doctor if they wish to become pregnant or think they might be pregnant. There is a potential for serious side effects to an unborn child. Talk to your health care professional or pharmacist for more information. Do not breast-feed an infant while taking   this medicine. Men should inform their doctors if they wish to father a child. This medicine may lower sperm counts. Talk to your health care professional or pharmacist for more information. What side effects may I notice from receiving this  medicine? Side effects that you should report to your doctor or health care professional as soon as possible: -allergic reactions like skin rash, itching or hives, swelling of the face, lips, or tongue -bone pain -breathing problems -changes in vision -chest pain -feeling faint or lightheaded, falls -fever, chills -pain, swelling, warmth in the leg -pain, tingling, numbness in the hands or feet -signs and symptoms of low blood pressure like dizziness; feeling faint or lightheaded, falls; unusually weak or tired -stomach pain -swelling of the ankles, feet, hands -trouble passing urine or change in the amount of urine -unusually high or low blood pressure -unusually weak or tired Side effects that usually do not require medical attention (report to your doctor or health care professional if they continue or are bothersome): -change in sex drive or performance -changes in breast size in both males and females -changes in emotions or moods -headache -hot flashes -irritation at site where injected -loss of appetite -skin problems like acne, dry skin -vaginal dryness This list may not describe all possible side effects. Call your doctor for medical advice about side effects. You may report side effects to FDA at 1-800-FDA-1088. Where should I keep my medicine? This drug is given in a hospital or clinic and will not be stored at home. NOTE: This sheet is a summary. It may not cover all possible information. If you have questions about this medicine, talk to your doctor, pharmacist, or health care provider.  2018 Elsevier/Gold Standard (2013-03-30 11:10:35)  

## 2016-11-14 NOTE — Progress Notes (Addendum)
Radiation Oncology         ([phone]) [phone] ________________________________  Outpatient Re-Evaluation Note  Name: Susan Morrison MRN: [id number]  Date: 11/14/2016  DOB: 11/06/1970  XN:[id number], Charlene Brooke, NP  Nicholas Lose, MD   REFERRING PHYSICIAN: Nicholas Lose, MD  DIAGNOSIS: The encounter diagnosis was Malignant neoplasm of lower-inner quadrant of left breast in female, estrogen receptor negative ([place]). 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 female who is here to discuss ongoing management of her triple negative breast cancer.  She had a diagnostic bilateral mammogram on 03/21/16. This showed an irregular mass in the LIQ of the left breast posterior depth and a partially imaged mass in the left axilla. Ultrasound of the left breast revealed an irregular hypoechoic mass in the 7:30 position 4 cm from the nipple measuring 3.5 x 2.2 x 2.6 cm corresponding to the palpable lump and mammographic finding. Ultrasound of the left axilla showed an enlarged/abnormal lymph node measuring 2.8 x 2.4 cm corresponding to the mammographic finding. There was no malignancies noted in the right breast. Biopsies were obtained on 03/22/16. Biopsy of the left breast 8:00 position revealed high grade IDC with necrosis.(triple negative, Ki67 90%) Biopsy of a left axillary lymph node revealed IDC.   Of note since the patient last visit, she has had left breast ultrasound biopsy completed on 04/10/2016 with results revealing a 10 mm hypoechoic area at the 9:30 position with no apparent complications. The known left breast carcinoma lies in the inferior posterior left breast measuring 3.9 x 2.7 x 3.1 cm. There are 3 adjacent small masses which shows similar enhancement characteristics to the index lesion. The largest of these measures 6 x 5 mm. Also in the left breast, there  is a focal area of non mass enhancement measuring 8 x 7 mm, which lies in the medial aspect of the breast, central depth. In the right breast, there are 3 discrete areas of focal non mass enhancement, which show similar enhancement characteristics with moderate wash-in and plateau washout kinetics. Lesions are as follows: 6 mm lesion, image 144, 5 mm lesion, image 84 and 5 mm lesion, image 75. Biopsy-proven enlarged abnormal left axillary metastatic lymph nodes. Additional prominent axillary lymph nodes are also noted. Abnormal appearing right axillary lymph node with a cortical thickness of 8 mm.  On 04/10/2016, she had an ultrasound to bilateral breasts and axillas. The breast ultrasound showed slightly irregular hypoechoic areas at the 1 o'clock position of the right breast, 8 o'clock position of the right breast in 9:30 position of the left breast, which more than likely represent the MR findings.  On 04/12/2016, patient had a CT CAP with findings consistent with known breast cancer. Additionally a left axillary lymph node measuring 2.4 x 2.4 cm. No other evidence of metastatic disease was seen.  Patient underwent Neo-Adjuvant chemotherapy from 04/17/16-08/28/16 consisting of dose dense Adriamycin and Cytoxan x 4 followed by Taxol weekly x 12 weeks.  On 05/15/2016, genetics testing was performed which was negative in 46 genes.   She also underwent right and left breast lumpectomies on 10/24/2016. 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 Four lymph nodes were surveyed and all found to be benign. Margins from this were clear.    Pt presents to the office today accompanied by her husband. She reports  that she is doing well overall. She reports some numbness along the posterior LUE. She reports some soreness due to the surgery but denies any other pain.  On review of systems, she denies pain, lymphedema.    PAST MEDICAL HISTORY:  has a  past medical history of Anemia; Breast mass (03/21/2016); Cancer ([place]); Genetic testing (06/03/2016); and PONV (postoperative nausea and vomiting).    PAST SURGICAL HISTORY: Past Surgical History:  Procedure Laterality Date  . DILATION AND CURETTAGE OF UTERUS     last done 2years ago  . PORTACATH PLACEMENT N/A 04/15/2016   Procedure: INSERTION PORT-A-CATH WITH [place];  Surgeon: Alphonsa Overall, MD;  Location: [facility];  Service: General;  Laterality: N/A;  . RADIOACTIVE SEED GUIDED AXILLARY SENTINEL LYMPH NODE Left 10/24/2016   Procedure: RADIOACTIVE SEED GUIDED AXILLARY TARGETED SENTINEL LYMPH NODE DISSECTION;  Surgeon: Alphonsa Overall, MD;  Location: [facility];  Service: General;  Laterality: Left;  . RADIOACTIVE SEED GUIDED EXCISIONAL BREAST BIOPSY Bilateral 10/24/2016   Procedure: RIGHT BREAST RADIOACTIVE SEED GUIDED EXCISIONAL BREAST BIOPSIES X'S 2, LEFT BREAST RADIOACTIVE SEED GUIDED EXCISIONAL BIOPSY X2;  Surgeon: Alphonsa Overall, MD;  Location: [facility];  Service: General;  Laterality: Bilateral;  . TUBAL LIGATION  1996    FAMILY HISTORY: family history includes Breast cancer (age of onset: 14) in her paternal grandmother; Diabetes in her mother; Hypertension in her mother.  SOCIAL HISTORY:  reports that she has never smoked. She has never used smokeless tobacco. She reports that she drinks alcohol. She reports that she does not use drugs.  ALLERGIES: Patient has no known allergies.  MEDICATIONS:  Current Outpatient Prescriptions  Medication Sig Dispense Refill  . acetaminophen (TYLENOL) 500 MG tablet Take 1,000 mg by mouth every 6 (six) hours as needed for moderate pain.    . cycloSPORINE (RESTASIS) 0.05 % ophthalmic emulsion Place 1 drop into both eyes 2 (two) times daily. 1 each 1  . ferrous sulfate 325 (65 FE) MG EC tablet Take 325 mg by mouth.    Marland Kitchen goserelin (ZOLADEX) 3.6 MG injection Inject 3.6 mg into the skin every 28 (twenty-eight) days.    . meloxicam  (MOBIC) 15 MG tablet Take 1 tablet (15 mg total) by mouth 2 (two) times daily. 60 tablet 3  . Multiple Vitamins-Minerals (WOMENS MULTI VITAMIN & MINERAL) TABS Take 1 tablet by mouth daily after breakfast. 30 tablet 3  . HYDROcodone-acetaminophen (NORCO/VICODIN) 5-325 MG tablet Take 1-2 tablets by mouth every 6 (six) hours as needed for moderate pain. (Patient not taking: Reported on 11/13/2016) 20 tablet 0  . lidocaine-prilocaine (EMLA) cream Apply to affected area once (Patient not taking: Reported on 11/13/2016) 30 g 3   No current facility-administered medications for this encounter.     REVIEW OF SYSTEMS:  A 10+ POINT REVIEW OF SYSTEMS WAS OBTAINED including neurology, dermatology, psychiatry, cardiac, respiratory, lymph, extremities, GI, GU, Musculoskeletal, constitutional, breasts, reproductive, HEENT.  All pertinent positives are noted in the HPI.  All others are negative.   PHYSICAL EXAM:  height is 5' 6.5" (1.689 m) and weight is 219 lb (99.3 kg). Her oral temperature is 98.5 F (36.9 C). Her blood pressure is 110/80 and her pulse is 66. Her oxygen saturation is 98%.    Lungs are clear to auscultation bilaterally. Heart has regular rate and rhythm. No palpable cervical, supraclavicular, or axillary adenopathy. Abdomen soft, non-tender, normal bowel sounds. Right breast with lumpectomy scar in UIQ, healing well. She has a second lumpectomy scar  in LOQ. Left breast has a scar in the approximately 10 o'clock position, healing well. Second lumpectomy scar in LIQ (previous location of cancer) She has a third scar in the axillary region that is also healing well.   ECOG = 0  LABORATORY DATA:  Lab Results  Component Value Date   WBC 4.1 08/28/2016   HGB 12.4 08/28/2016   HCT 36.5 08/28/2016   MCV 75.3 (L) 08/28/2016   PLT 241 08/28/2016   NEUTROABS 2.5 08/28/2016   Lab Results  Component Value Date   NA 139 08/28/2016   K 4.0 08/28/2016   CL 108 05/15/2016   CO2 23 08/28/2016    GLUCOSE 92 08/28/2016   CREATININE 0.7 08/28/2016   CALCIUM 9.8 08/28/2016      RADIOGRAPHY: Mm Breast Surgical Specimen  Result Date: 10/24/2016 CLINICAL DATA:  Evaluate surgical specimen following excision of left axillary lymph node metastasis. EXAM: SPECIMEN RADIOGRAPH OF THE LEFT AXILLA COMPARISON:  Previous exam(s). FINDINGS: Status post excision of the left axilla. The radioactive seed and biopsy marker clip are present and completely intact. IMPRESSION: Specimen radiograph of the left axilla Electronically Signed   By: Margarette Canada M.D.   On: 10/24/2016 14:16   Mm Breast Surgical Specimen  Result Date: 10/24/2016 CLINICAL DATA:  Evaluate surgical specimen following left lumpectomy for left breast cancer. EXAM: SPECIMEN RADIOGRAPH OF THE LEFT BREAST COMPARISON:  Previous exam(s). FINDINGS: Status post excision of the left breast. The radioactive seed and biopsy marker clip are present, completely intact, and were marked for pathology. IMPRESSION: Specimen radiograph of the left breast. Electronically Signed   By: Margarette Canada M.D.   On: 10/24/2016 13:54   Mm Breast Surgical Specimen  Result Date: 10/24/2016 CLINICAL DATA:  Surgical excision of a left breast papilloma. EXAM: SPECIMEN RADIOGRAPH OF THE LEFT BREAST COMPARISON:  Previous exam(s). FINDINGS: Status post excision of the left breast. The radioactive seed and coil shaped biopsy marker clip are present, completely intact, and were marked for pathology. IMPRESSION: Specimen radiograph of the left breast. Electronically Signed   By: Claudie Revering M.D.   On: 10/24/2016 13:40   Mm Breast Surgical Specimen  Result Date: 10/24/2016 CLINICAL DATA:  46 year old female for evaluation of surgical specimen following excision of right breast complex sclerosing lesion. EXAM: SPECIMEN RADIOGRAPH OF THE RIGHT BREAST COMPARISON:  Previous exam(s). FINDINGS: Status post excision of the right breast. The radioactive seed and ribbon shaped biopsy marker  clip are present, completely intact, and were marked for pathology. IMPRESSION: Specimen radiograph of the right breast. Electronically Signed   By: Margarette Canada M.D.   On: 10/24/2016 13:14   Mm Breast Surgical Specimen  Result Date: 10/24/2016 CLINICAL DATA:  Evaluate surgical specimen following excision of right breast complex sclerosing lesion. EXAM: SPECIMEN RADIOGRAPH OF THE RIGHT BREAST COMPARISON:  Previous exam(s). FINDINGS: Status post excision of the right breast. The radioactive seed and coil shaped biopsy marker clip are present, completely intact, and were marked for pathology. IMPRESSION: Specimen radiograph of the right breast. Electronically Signed   By: Margarette Canada M.D.   On: 10/24/2016 13:03   Mm Lt Radioactive Seed Loc Mammo Guide  Result Date: 10/23/2016 CLINICAL DATA:  46 year old female for seed localization of a left breast biopsy site demonstrating carcinoma and of a second left breast biopsy site demonstrating a papilloma. EXAM: MAMMOGRAPHIC GUIDED RADIOACTIVE SEED LOCALIZATION OF THE LEFT BREAST COMPARISON:  Previous exam(s). FINDINGS: Patient presents for radioactive seed localization prior to left lumpectomy, bilateral  excisional biopsies, and excision of a metastatic left axillary lymph node. I met with the patient and we discussed the procedure of seed localization including benefits and alternatives. We discussed the high likelihood of a successful procedure. We discussed the risks of the procedure including infection, bleeding, tissue injury and further surgery. We discussed the low dose of radioactivity involved in the procedure. Informed, written consent was given. The usual time-out protocol was performed immediately prior to the procedure. 1st Seed: Using mammographic guidance, sterile technique, 1% lidocaine and an I-125 radioactive seed, the ribbon shaped biopsy marker in the posterior lower, inner left breast was localized using a medial to lateral approach. The  follow-up mammogram images confirm the seed in the expected location and were marked for Dr. Lucia Gaskins. Follow-up survey of the patient confirms presence of the radioactive seed. Order number of I-125 seed:  [id number]. Total activity:  0.245 mCi  reference Date: 10/22/2016 2nd Seed: Using mammographic guidance, sterile technique, 1% lidocaine and an I-125 radioactive seed, the coil shaped biopsy marker in the medial left breast was localized using a medial to lateral approach. The follow-up mammogram images confirm the seed in the expected location and were marked for Dr. Lucia Gaskins. Follow-up survey of the patient confirms presence of the radioactive seed. Order number of I-125 seed:  [id number]. Total activity:  0.250 mCi  reference Date: 10/22/2016 The patient tolerated the procedure well and was released from the [facility]. She was given instructions regarding seed removal. IMPRESSION: Radioactive seed localization of 2 left breast biopsy sites. No apparent complications. Electronically Signed   By: Pamelia Hoit M.D.   On: 10/23/2016 16:03   Mm Lt Rad Seed Ea Add Lesion Loc Mammo  Result Date: 10/23/2016 CLINICAL DATA:  46 year old female for seed localization of a left breast biopsy site demonstrating carcinoma and of a second left breast biopsy site demonstrating a papilloma. EXAM: MAMMOGRAPHIC GUIDED RADIOACTIVE SEED LOCALIZATION OF THE LEFT BREAST COMPARISON:  Previous exam(s). FINDINGS: Patient presents for radioactive seed localization prior to left lumpectomy, bilateral excisional biopsies, and excision of a metastatic left axillary lymph node. I met with the patient and we discussed the procedure of seed localization including benefits and alternatives. We discussed the high likelihood of a successful procedure. We discussed the risks of the procedure including infection, bleeding, tissue injury and further surgery. We discussed the low dose of radioactivity involved in the procedure. Informed, written  consent was given. The usual time-out protocol was performed immediately prior to the procedure. 1st Seed: Using mammographic guidance, sterile technique, 1% lidocaine and an I-125 radioactive seed, the ribbon shaped biopsy marker in the posterior lower, inner left breast was localized using a medial to lateral approach. The follow-up mammogram images confirm the seed in the expected location and were marked for Dr. Lucia Gaskins. Follow-up survey of the patient confirms presence of the radioactive seed. Order number of I-125 seed:  [id number]. Total activity:  0.245 mCi  reference Date: 10/22/2016 2nd Seed: Using mammographic guidance, sterile technique, 1% lidocaine and an I-125 radioactive seed, the coil shaped biopsy marker in the medial left breast was localized using a medial to lateral approach. The follow-up mammogram images confirm the seed in the expected location and were marked for Dr. Lucia Gaskins. Follow-up survey of the patient confirms presence of the radioactive seed. Order number of I-125 seed:  [id number]. Total activity:  0.250 mCi  reference Date: 10/22/2016 The patient tolerated the procedure well and was released from the [facility]. She was given instructions  regarding seed removal. IMPRESSION: Radioactive seed localization of 2 left breast biopsy sites. No apparent complications. Electronically Signed   By: Pamelia Hoit M.D.   On: 10/23/2016 16:03   Mm Rt Radioactive Seed Loc Mammo Guide  Result Date: 10/23/2016 CLINICAL DATA:  46 year old female for seed localization of 2 right breast biopsy sites demonstrating complex sclerosing lesions. EXAM: MAMMOGRAPHIC GUIDED RADIOACTIVE SEED LOCALIZATION OF THE RIGHT BREAST COMPARISON:  Previous exam(s). FINDINGS: Patient presents for radioactive seed localization prior to left lumpectomy, bilateral excisional biopsies, and excision of a known metastatic left axillary lymph node. I met with the patient and we discussed the procedure of seed localization  including benefits and alternatives. We discussed the high likelihood of a successful procedure. We discussed the risks of the procedure including infection, bleeding, tissue injury and further surgery. We discussed the low dose of radioactivity involved in the procedure. Informed, written consent was given. The usual time-out protocol was performed immediately prior to the procedure. 1st Seed: Using mammographic guidance, sterile technique, 1% lidocaine and an I-125 radioactive seed, the ribbon shaped biopsy marker in the upper, inner right breast was localized using a medial to lateral approach. The follow-up mammogram images confirm the seed in the expected location and were marked for Dr. Lucia Gaskins. Follow-up survey of the patient confirms presence of the radioactive seed. Order number of I-125 seed:  [id number]. Total activity:  0.252 mCi  reference Date: 09/19/2016 2nd seed: Using mammographic guidance, sterile technique, 1% lidocaine and an I-125 radioactive seed, the coil shaped biopsy marker in the upper, outer right breast was localized using a superior to inferior approach. The follow-up mammogram images confirm the seed in the expected location and were marked for Dr. Lucia Gaskins. Follow-up survey of the patient confirms presence of the radioactive seed. Order number of I-125 seed:  [id number]. Total activity:  0.252 mCi  reference Date: 09/19/2016 The patient tolerated the procedure well and was released from the [facility]. She was given instructions regarding seed removal. IMPRESSION: Radioactive seed localization of 2 right breast biopsy sites demonstrating complex sclerosing lesions. No apparent complications. Electronically Signed   By: Pamelia Hoit M.D.   On: 10/23/2016 15:56   Mm Rt Radio Seed Ea Add Lesion Loc Mammo  Result Date: 10/23/2016 CLINICAL DATA:  46 year old female for seed localization of 2 right breast biopsy sites demonstrating complex sclerosing lesions. EXAM: MAMMOGRAPHIC GUIDED  RADIOACTIVE SEED LOCALIZATION OF THE RIGHT BREAST COMPARISON:  Previous exam(s). FINDINGS: Patient presents for radioactive seed localization prior to left lumpectomy, bilateral excisional biopsies, and excision of a known metastatic left axillary lymph node. I met with the patient and we discussed the procedure of seed localization including benefits and alternatives. We discussed the high likelihood of a successful procedure. We discussed the risks of the procedure including infection, bleeding, tissue injury and further surgery. We discussed the low dose of radioactivity involved in the procedure. Informed, written consent was given. The usual time-out protocol was performed immediately prior to the procedure. 1st Seed: Using mammographic guidance, sterile technique, 1% lidocaine and an I-125 radioactive seed, the ribbon shaped biopsy marker in the upper, inner right breast was localized using a medial to lateral approach. The follow-up mammogram images confirm the seed in the expected location and were marked for Dr. Lucia Gaskins. Follow-up survey of the patient confirms presence of the radioactive seed. Order number of I-125 seed:  [id number]. Total activity:  0.252 mCi  reference Date: 09/19/2016 2nd seed: Using mammographic guidance, sterile technique, 1% lidocaine and  an I-125 radioactive seed, the coil shaped biopsy marker in the upper, outer right breast was localized using a superior to inferior approach. The follow-up mammogram images confirm the seed in the expected location and were marked for Dr. Lucia Gaskins. Follow-up survey of the patient confirms presence of the radioactive seed. Order number of I-125 seed:  [id number]. Total activity:  0.252 mCi  reference Date: 09/19/2016 The patient tolerated the procedure well and was released from the [facility]. She was given instructions regarding seed removal. IMPRESSION: Radioactive seed localization of 2 right breast biopsy sites demonstrating complex sclerosing  lesions. No apparent complications. Electronically Signed   By: Pamelia Hoit M.D.   On: 10/23/2016 15:56   [place] Lt Radioactive Seed Loc  Result Date: 10/23/2016 CLINICAL DATA:  46 year old female for ultrasound-guided seed localization of a known metastatic left axillary lymph node. EXAM: ULTRASOUND GUIDED RADIOACTIVE SEED LOCALIZATION OF THE LEFT AXILLA COMPARISON:  Previous exam(s). FINDINGS: Patient presents for radioactive seed localization prior to left lumpectomy, bilateral excisional biopsies, and excision of a known metastatic left axillary lymph node. I met with the patient and we discussed the procedure of seed localization including benefits and alternatives. We discussed the high likelihood of a successful procedure. We discussed the risks of the procedure including infection, bleeding, tissue injury and further surgery. We discussed the low dose of radioactivity involved in the procedure. Informed, written consent was given. The usual time-out protocol was performed immediately prior to the procedure. Using ultrasound guidance, sterile technique, 1% lidocaine and an I-125 radioactive seed, abnormal left axillary lymph node was localized using a lateral to medial approach. The follow-up mammogram images confirm the seed in the expected location and were marked for Dr. Lucia Gaskins. Follow-up survey of the patient confirms presence of the radioactive seed. Order number of I-125 seed:  [id number]. Total activity:  0.250 mCi         reference Date: 10/22/2016 The patient tolerated the procedure well and was released from the [facility]. She was given instructions regarding seed removal. IMPRESSION: Radioactive seed localization of a known metastatic left axillary lymph node. No apparent complications. Electronically Signed   By: Pamelia Hoit M.D.   On: 10/23/2016 15:51      IMPRESSION: Susan Morrison is a  46 y.o. 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 Patient will be a good candidate for breast conservation with radiotherapy to the left breast. Would also recommend coverage of the high axilla/Scf with her tx. We discussed the general course of radiation, potential side effects, and toxicities with radiation and the patient is interested in this approach. A consent form was signed and placed in her chart today.   PLAN: Recommended patient to have radiation following her surgery. Explained complications of radiation therapy and things to expect from the treatment. CT Sim scheduled for 11/26/2016 at 8 AM. She will start her radiation therapy approximately one week later.     ------------------------------------------------  Blair Promise, PhD, MD  This document serves as a record of services personally performed by Gery Pray, MD. It was created on her behalf by Marlowe Kays, a trained medical scribe. The creation of this record is based on the scribe's personal observations and the provider's statements to them. This document has been checked and approved by the attending provider.

## 2016-11-26 ENCOUNTER — Ambulatory Visit
Admission: RE | Admit: 2016-11-26 | Discharge: 2016-11-26 | Disposition: A | Discharge status: Home / Self Care | Payer: Medicaid Other | Source: Ambulatory Visit | Attending: Radiation Oncology | Admitting: Radiation Oncology

## 2016-11-26 DIAGNOSIS — C50312 Malignant neoplasm of lower-inner quadrant of left female breast: Secondary | ICD-10-CM

## 2016-11-26 DIAGNOSIS — Z510 Encounter for antineoplastic radiation therapy: Secondary | ICD-10-CM | POA: Diagnosis not present

## 2016-11-26 DIAGNOSIS — Z171 Estrogen receptor negative status [ER-]: Principal | ICD-10-CM

## 2016-11-27 ENCOUNTER — Ambulatory Visit: Payer: Medicaid Other

## 2016-11-27 NOTE — Progress Notes (Signed)
  Radiation Oncology         [phone] ________________________________  Name: Susan Morrison MRN: [id number]  Date: 11/26/2016  DOB: 13-Oct-1970  SIMULATION AND TREATMENT PLANNING NOTE    ICD-10-CM   1. Malignant neoplasm of lower-inner quadrant of left breast in female, estrogen receptor negative ([place]) C50.312    Z17.1     DIAGNOSIS:   The encounter diagnosis was Malignant neoplasm of lower-inner quadrant of left breast in female, estrogen receptor negative ([place]). 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  Identity was confirmed.  All relevant records and images related to the planned course of therapy were reviewed.  The patient freely provided informed written consent to proceed with treatment after reviewing the details related to the planned course of therapy. The consent form was witnessed and verified by the simulation staff.  Then, the patient was set-up in a stable reproducible  supine position for radiation therapy.  CT images were obtained.  Surface markings were placed.  The CT images were loaded into the planning software.  Then the target and avoidance structures were contoured.  Treatment planning then occurred.  The radiation prescription was entered and confirmed.  Then, I designed and supervised the construction of a total of 4 medically necessary complex treatment devices.  I have requested : 3D Simulation  I have requested a DVH of the following structures: clinical target volume (lumpectomy cavity, heart, lungs.  I have ordered:dose calc.  PLAN:  The patient will receive 50.4 Gy in 28 fractions directed at the left breast. The high axilla/ supraclavicular region will receive 45 gray in 25 fractions.  -----------------------------------  Blair Promise, PhD, MD

## 2016-12-02 DIAGNOSIS — Z510 Encounter for antineoplastic radiation therapy: Secondary | ICD-10-CM | POA: Diagnosis not present

## 2016-12-03 ENCOUNTER — Ambulatory Visit
Admission: RE | Admit: 2016-12-03 | Discharge: 2016-12-03 | Disposition: A | Discharge status: Home / Self Care | Payer: Medicaid Other | Source: Ambulatory Visit | Attending: Radiation Oncology | Admitting: Radiation Oncology

## 2016-12-03 DIAGNOSIS — Z510 Encounter for antineoplastic radiation therapy: Secondary | ICD-10-CM | POA: Diagnosis not present

## 2016-12-03 DIAGNOSIS — Z171 Estrogen receptor negative status [ER-]: Principal | ICD-10-CM

## 2016-12-03 DIAGNOSIS — C50312 Malignant neoplasm of lower-inner quadrant of left female breast: Secondary | ICD-10-CM

## 2016-12-03 NOTE — Progress Notes (Signed)
  Radiation Oncology         [phone] ________________________________  Name: Susan Morrison MRN: [id number]  Date: 12/03/2016  DOB: 1970/05/02  Simulation Verification Note    ICD-10-CM   1. Malignant neoplasm of lower-inner quadrant of left breast in female, estrogen receptor negative ([place]) C50.312    Z17.1     Status: outpatient  NARRATIVE: The patient was brought to the treatment unit and placed in the planned treatment position. The clinical setup was verified. Then port films were obtained and uploaded to the radiation oncology medical record software.  The treatment beams were carefully compared against the planned radiation fields. The position location and shape of the radiation fields was reviewed. They targeted volume of tissue appears to be appropriately covered by the radiation beams. Organs at risk appear to be excluded as planned.  Based on my personal review, I approved the simulation verification. The patient's treatment will proceed as planned.  -----------------------------------  Blair Promise, PhD, MD

## 2016-12-04 ENCOUNTER — Ambulatory Visit
Admission: RE | Admit: 2016-12-04 | Discharge: 2016-12-04 | Disposition: A | Discharge status: Home / Self Care | Payer: Medicaid Other | Source: Ambulatory Visit | Attending: Radiation Oncology | Admitting: Radiation Oncology

## 2016-12-04 ENCOUNTER — Ambulatory Visit: Payer: Medicaid Other

## 2016-12-04 DIAGNOSIS — Z510 Encounter for antineoplastic radiation therapy: Secondary | ICD-10-CM | POA: Diagnosis not present

## 2016-12-05 ENCOUNTER — Telehealth: Payer: Self-pay

## 2016-12-05 ENCOUNTER — Ambulatory Visit
Admission: RE | Admit: 2016-12-05 | Discharge: 2016-12-05 | Disposition: A | Discharge status: Home / Self Care | Payer: Medicaid Other | Source: Ambulatory Visit | Attending: Radiation Oncology | Admitting: Radiation Oncology

## 2016-12-05 DIAGNOSIS — Z510 Encounter for antineoplastic radiation therapy: Secondary | ICD-10-CM | POA: Diagnosis not present

## 2016-12-05 HISTORY — PX: PORT-A-CATH REMOVAL: SHX5289

## 2016-12-05 NOTE — Telephone Encounter (Signed)
Spoke with patient and she is aware of her inj appts  Susan Morrison

## 2016-12-06 ENCOUNTER — Ambulatory Visit
Admission: RE | Admit: 2016-12-06 | Discharge: 2016-12-06 | Disposition: A | Discharge status: Home / Self Care | Payer: Medicaid Other | Source: Ambulatory Visit | Attending: Radiation Oncology | Admitting: Radiation Oncology

## 2016-12-06 DIAGNOSIS — Z510 Encounter for antineoplastic radiation therapy: Secondary | ICD-10-CM | POA: Diagnosis not present

## 2016-12-09 ENCOUNTER — Ambulatory Visit
Admission: RE | Admit: 2016-12-09 | Discharge: 2016-12-09 | Disposition: A | Discharge status: Home / Self Care | Payer: Medicaid Other | Source: Ambulatory Visit | Attending: Radiation Oncology | Admitting: Radiation Oncology

## 2016-12-09 DIAGNOSIS — Z510 Encounter for antineoplastic radiation therapy: Secondary | ICD-10-CM | POA: Diagnosis not present

## 2016-12-10 ENCOUNTER — Ambulatory Visit
Admission: RE | Admit: 2016-12-10 | Discharge: 2016-12-10 | Disposition: A | Discharge status: Home / Self Care | Payer: Medicaid Other | Source: Ambulatory Visit | Attending: Radiation Oncology | Admitting: Radiation Oncology

## 2016-12-10 DIAGNOSIS — C50312 Malignant neoplasm of lower-inner quadrant of left female breast: Secondary | ICD-10-CM

## 2016-12-10 DIAGNOSIS — Z510 Encounter for antineoplastic radiation therapy: Secondary | ICD-10-CM | POA: Diagnosis not present

## 2016-12-10 DIAGNOSIS — Z171 Estrogen receptor negative status [ER-]: Principal | ICD-10-CM

## 2016-12-10 MED ORDER — ALRA NON-METALLIC DEODORANT (RAD-ONC)
1.0000 "application " | Freq: Two times a day (BID) | TOPICAL | Status: DC
  Administered 2016-12-10: 1 via TOPICAL

## 2016-12-10 MED ORDER — SONAFINE EX EMUL
1.0000 "application " | Freq: Two times a day (BID) | CUTANEOUS | Status: DC
  Administered 2016-12-10: 1 via TOPICAL

## 2016-12-10 NOTE — Progress Notes (Signed)
Pt here for patient teaching.  Pt given Radiation and You booklet, skin care instructions, Alra deodorant and Sonafine.  Reviewed areas of pertinence such as fatigue, skin changes, breast tenderness and breast swelling . Pt able to give teach back of to pat skin and use unscented/gentle soap,apply Sonafine bid, avoid applying anything to skin within 4 hours of treatment, avoid wearing an under wire bra and to use an electric razor if they must shave. Pt verbalizes understanding of information given and will contact nursing with any questions or concerns.     Http://rtanswers.org/treatmentinformation/whattoexpect/index  Sonafine was given because radioplex was out of stock.

## 2016-12-11 ENCOUNTER — Ambulatory Visit (HOSPITAL_BASED_OUTPATIENT_CLINIC_OR_DEPARTMENT_OTHER): Payer: Medicaid Other

## 2016-12-11 ENCOUNTER — Ambulatory Visit: Payer: Medicaid Other | Attending: Hematology and Oncology

## 2016-12-11 ENCOUNTER — Ambulatory Visit
Admission: RE | Admit: 2016-12-11 | Discharge: 2016-12-11 | Disposition: A | Discharge status: Home / Self Care | Payer: Medicaid Other | Source: Ambulatory Visit | Attending: Radiation Oncology | Admitting: Radiation Oncology

## 2016-12-11 VITALS — BP 140/85 | HR 67 | Temp 98.0°F | Resp 18

## 2016-12-11 DIAGNOSIS — Z5111 Encounter for antineoplastic chemotherapy: Secondary | ICD-10-CM | POA: Diagnosis present

## 2016-12-11 DIAGNOSIS — C773 Secondary and unspecified malignant neoplasm of axilla and upper limb lymph nodes: Secondary | ICD-10-CM | POA: Diagnosis not present

## 2016-12-11 DIAGNOSIS — R293 Abnormal posture: Secondary | ICD-10-CM | POA: Insufficient documentation

## 2016-12-11 DIAGNOSIS — M25612 Stiffness of left shoulder, not elsewhere classified: Secondary | ICD-10-CM | POA: Diagnosis present

## 2016-12-11 DIAGNOSIS — Z95828 Presence of other vascular implants and grafts: Secondary | ICD-10-CM

## 2016-12-11 DIAGNOSIS — D500 Iron deficiency anemia secondary to blood loss (chronic): Secondary | ICD-10-CM

## 2016-12-11 DIAGNOSIS — M79622 Pain in left upper arm: Secondary | ICD-10-CM | POA: Insufficient documentation

## 2016-12-11 DIAGNOSIS — Z483 Aftercare following surgery for neoplasm: Secondary | ICD-10-CM | POA: Diagnosis present

## 2016-12-11 DIAGNOSIS — C50312 Malignant neoplasm of lower-inner quadrant of left female breast: Secondary | ICD-10-CM | POA: Diagnosis not present

## 2016-12-11 DIAGNOSIS — Z510 Encounter for antineoplastic radiation therapy: Secondary | ICD-10-CM | POA: Diagnosis not present

## 2016-12-11 MED ORDER — GOSERELIN ACETATE 3.6 MG ~~LOC~~ IMPL
3.6000 mg | DRUG_IMPLANT | Freq: Once | SUBCUTANEOUS | Status: AC
  Administered 2016-12-11: 3.6 mg via SUBCUTANEOUS
  Filled 2016-12-11: qty 3.6

## 2016-12-11 NOTE — Addendum Note (Signed)
Addended by: Jomarie Longs on: 12/11/2016 02:06 PM   Modules accepted: Orders

## 2016-12-11 NOTE — Therapy (Addendum)
[place], [place], [address] Phone: [phone]   Fax:  [phone]  Physical Therapy Treatment  Patient Details  Name: Susan Morrison MRN: [id number] Date of Birth: 09-05-1970 Referring Provider: Dr. Nicholas Lose   Encounter Date: 12/11/2016  PT End of Session - 12/11/16 1325    Visit Number  2    Number of Visits  4    Date for PT Re-Evaluation  12/20/16    PT Start Time  9628    PT Stop Time  1346    PT Time Calculation (min)  43 min    Activity Tolerance  Patient tolerated treatment well    Behavior During Therapy  [facility] for tasks assessed/performed       Past Medical History:  Diagnosis Date  . Anemia   . Breast mass 03/21/2016   left 7:00  . Cancer ([place])    left brest cancer   . Genetic testing 06/03/2016   Ms. Chiang underwent genetic counseling and testing for hereditary cancer syndromes on 05/15/2016. Her results were negative for mutations in all 46 genes analyzed by [facility]'s 46-gene Common Hereditary Cancers Panel. Genes analyzed include: APC, ATM, AXIN2, BARD1, BMPR1A, BRCA1, BRCA2, BRIP1, CDH1, CDKN2A, CHEK2, CTNNA1, DICER1, EPCAM, GREM1, HOXB13, KIT, MEN1, MLH1, MSH2, MSH3, MSH6, MUTYH, NBN,  . PONV (postoperative nausea and vomiting)     Past Surgical History:  Procedure Laterality Date  . DILATION AND CURETTAGE OF UTERUS     last done 2years ago  . TUBAL LIGATION  1996    There were no vitals filed for this visit.  Subjective Assessment - 12/11/16 1307    Subjective  The burning sensation in the back of my arm is gone and I can lay on both sides now. My biggest complaint since starting therapy has been my Lt chest/outer breast tightness seems to be getting worse.     Pertinent History  Susan Morrison is a  46 year old above-mentioned history of triple negative left breast cancer underwent neoadjuvant chemotherapy followed by lumpectomy 10/24/16. Removed 3 lymph nodes on the  left and 2 on the right. She had complete pathologic response. There is no evidence of disease in the final specimen.She is recovering very well from the surgery. No other health issues. Has radiation simulation tomorrow and will start four weeks of treatment after that.    Patient Stated Goals  be able to use my left arm normally    Currently in Pain?  No/denies         [facility] PT Assessment - 12/11/16 0001      AROM   Left Shoulder Flexion  152 Degrees No more burning sensation   No more burning sensation   Left Shoulder ABduction  164 Degrees No more burning sensation   No more burning sensation                 [facility] Adult PT Treatment/Exercise - 12/11/16 0001      Shoulder Exercises: Supine   Horizontal ABduction  Strengthening;Both;10 reps;Theraband    Theraband Level (Shoulder Horizontal ABduction)  Level 1 (Yellow)    External Rotation  Strengthening;Both;10 reps;Theraband    Theraband Level (Shoulder External Rotation)  Level 1 (Yellow)    Flexion  Strengthening;Both;10 reps;Theraband Narrow and Wide Grip, 10 times each   Narrow and Wide Grip, 10 times each   Theraband Level (Shoulder Flexion)  Level 1 (Yellow)    Other Supine Exercises  Bil D2 with yellow  theraband 10 times each side with pt returning therapist demo      Shoulder Exercises: Pulleys   Flexion  2 minutes    ABduction  1 minute      Shoulder Exercises: Therapy Ball   Flexion  10 reps With forward lean into end of stretch   With forward lean into end of stretch   ABduction  10 reps Lt UE with side lean into end of stretch   Lt UE with side lean into end of stretch     Shoulder Exercises: Stretch   Corner Stretch  3 reps;20 seconds In doorway   In doorway     Manual Therapy   Manual Therapy  Passive ROM    Passive ROM  In Supine to Lt shoulder into flexion, abduction, er and D2 all to pts tolerance.              PT Education - 12/11/16 1317    Education provided  Yes    Education  Details  Supine scapular series with yellow theraband    Person(s) Educated  Patient    Methods  Explanation;Demonstration;Handout    Comprehension  Verbalized understanding;Returned demonstration             [facility] Goals - 12/11/16 1320      CC Long Term Goal  #1   Title  Patient will report pain in left upper arm has stopped waking her up at night.    Baseline  Pain wakes her up if she sleeps on right side.; this hasn't happened for 2 weeks-12/11/16    Status  Achieved      CC Long Term Goal  #2   Title  Left shoulder active abduction to at least 170 degrees for improved overhead reach.    Baseline  153 at eval compared to 174 on right; 164 degrees-12/11/16    Status  On-going      CC Long Term Goal  #3   Title  Left shoulder active er to at least 80 degrees for improved ADLs.    Baseline  70 at eval compared to 80 on right; 84 degrees-12/11/16    Status  Achieved      CC Long Term Goal  #4   Title  Independent with HEP for stretching left shoulder area.    Baseline  Limited knowledge; progressed HEP to include scapular strength-12/11/16    Status  On-going         Plan - 12/11/16 1325    Clinical Impression Statement  This is pts second visit since starting hterapy and she has progressed well so far. (Eval was ~3 weeks ago). She has been compliant with inital HEP so progressed this today to include supine scapular series which she tolerated well. Pt has met 2/4 goals thus far.     Rehab Potential  Excellent    Clinical Impairments Affecting Rehab Potential  recent (10/24/16) surgery with multiple incisions    PT Frequency  -- 3 visits   3 visits   PT Duration  4 weeks    PT Treatment/Interventions  ADLs/Self Care Home Management;Therapeutic exercise;Patient/family education;Manual techniques;Scar mobilization;Passive range of motion;Manual lymph drainage;Taping    PT Next Visit Plan  Review HEP prn; Cont P/AA/AROM to left shoulder; myofascial release to left  upper quadrant; progress HEP    PT Home Exercise Plan  continue four stretches she was shown by the nurse; supine scapular series     Consulted and Agree with Plan of  Care  Patient       Patient will benefit from skilled therapeutic intervention in order to improve the following deficits and impairments:  Pain, Decreased range of motion, Impaired UE functional use  Visit Diagnosis: Stiffness of left shoulder, not elsewhere classified  Pain in left upper arm  Aftercare following surgery for neoplasm  Abnormal posture     Problem List Patient Active Problem List   Diagnosis Date Noted  . Port catheter in place 06/11/2016  . Genetic testing 06/03/2016  . Keratoconjunctivitis of both eyes 06/03/2016  . UPJ obstruction, congenital 05/22/2016  . Iron deficiency anemia due to chronic blood loss 04/03/2016  . Malignant neoplasm of lower-inner quadrant of left breast in female, estrogen receptor negative ([place]) 03/26/2016  . Menorrhagia with regular cycle 03/05/2016  . Anemia 03/05/2016  . Left breast mass 03/05/2016  . Elevated liver enzymes 03/05/2016    Otelia Limes, PTA 12/11/2016, 1:48 PM  [place], [place], [address] Phone: [phone]   Fax:  [phone]  Name: DALANIE KISNER MRN: [id number] Date of Birth: 11-17-1970  Date extension included in renewal done today.  Serafina Royals, PT 12/11/16 2:06 PM

## 2016-12-11 NOTE — Patient Instructions (Signed)
Goserelin injection What is this medicine? GOSERELIN (GOE se rel in) is similar to a hormone found in the body. It lowers the amount of sex hormones that the body makes. Men will have lower testosterone levels and women will have lower estrogen levels while taking this medicine. In men, this medicine is used to treat prostate cancer; the injection is either given once per month or once every 12 weeks. A once per month injection (only) is used to treat women with endometriosis, dysfunctional uterine bleeding, or advanced breast cancer. This medicine may be used for other purposes; ask your health care provider or pharmacist if you have questions. COMMON BRAND NAME(S): Zoladex What should I tell my health care provider before I take this medicine? They need to know if you have any of these conditions (some only apply to women): -diabetes -heart disease or previous heart attack -high blood pressure -high cholesterol -kidney disease -osteoporosis or low bone density -problems passing urine -spinal cord injury -stroke -tobacco smoker -an unusual or allergic reaction to goserelin, hormone therapy, other medicines, foods, dyes, or preservatives -pregnant or trying to get pregnant -breast-feeding How should I use this medicine? This medicine is for injection under the skin. It is given by a health care professional in a hospital or clinic setting. Men receive this injection once every 4 weeks or once every 12 weeks. Women will only receive the once every 4 weeks injection. Talk to your pediatrician regarding the use of this medicine in children. Special care may be needed. Overdosage: If you think you have taken too much of this medicine contact a poison control center or emergency room at once. NOTE: This medicine is only for you. Do not share this medicine with others. What if I miss a dose? It is important not to miss your dose. Call your doctor or health care professional if you are unable to  keep an appointment. What may interact with this medicine? -female hormones like estrogen -herbal or dietary supplements like black cohosh, chasteberry, or DHEA -female hormones like testosterone -prasterone This list may not describe all possible interactions. Give your health care provider a list of all the medicines, herbs, non-prescription drugs, or dietary supplements you use. Also tell them if you smoke, drink alcohol, or use illegal drugs. Some items may interact with your medicine. What should I watch for while using this medicine? Visit your doctor or health care professional for regular checks on your progress. Your symptoms may appear to get worse during the first weeks of this therapy. Tell your doctor or healthcare professional if your symptoms do not start to get better or if they get worse after this time. Your bones may get weaker if you take this medicine for a long time. If you smoke or frequently drink alcohol you may increase your risk of bone loss. A family history of osteoporosis, chronic use of drugs for seizures (convulsions), or corticosteroids can also increase your risk of bone loss. Talk to your doctor about how to keep your bones strong. This medicine should stop regular monthly menstration in women. Tell your doctor if you continue to menstrate. Women should not become pregnant while taking this medicine or for 12 weeks after stopping this medicine. Women should inform their doctor if they wish to become pregnant or think they might be pregnant. There is a potential for serious side effects to an unborn child. Talk to your health care professional or pharmacist for more information. Do not breast-feed an infant while taking   this medicine. Men should inform their doctors if they wish to father a child. This medicine may lower sperm counts. Talk to your health care professional or pharmacist for more information. What side effects may I notice from receiving this  medicine? Side effects that you should report to your doctor or health care professional as soon as possible: -allergic reactions like skin rash, itching or hives, swelling of the face, lips, or tongue -bone pain -breathing problems -changes in vision -chest pain -feeling faint or lightheaded, falls -fever, chills -pain, swelling, warmth in the leg -pain, tingling, numbness in the hands or feet -signs and symptoms of low blood pressure like dizziness; feeling faint or lightheaded, falls; unusually weak or tired -stomach pain -swelling of the ankles, feet, hands -trouble passing urine or change in the amount of urine -unusually high or low blood pressure -unusually weak or tired Side effects that usually do not require medical attention (report to your doctor or health care professional if they continue or are bothersome): -change in sex drive or performance -changes in breast size in both males and females -changes in emotions or moods -headache -hot flashes -irritation at site where injected -loss of appetite -skin problems like acne, dry skin -vaginal dryness This list may not describe all possible side effects. Call your doctor for medical advice about side effects. You may report side effects to FDA at 1-800-FDA-1088. Where should I keep my medicine? This drug is given in a hospital or clinic and will not be stored at home. NOTE: This sheet is a summary. It may not cover all possible information. If you have questions about this medicine, talk to your doctor, pharmacist, or health care provider.  2018 Elsevier/Gold Standard (2013-03-30 11:10:35)  

## 2016-12-11 NOTE — Patient Instructions (Signed)

## 2016-12-12 ENCOUNTER — Ambulatory Visit
Admission: RE | Admit: 2016-12-12 | Discharge: 2016-12-12 | Disposition: A | Discharge status: Home / Self Care | Payer: Medicaid Other | Source: Ambulatory Visit | Attending: Radiation Oncology | Admitting: Radiation Oncology

## 2016-12-12 DIAGNOSIS — Z510 Encounter for antineoplastic radiation therapy: Secondary | ICD-10-CM | POA: Diagnosis not present

## 2016-12-13 ENCOUNTER — Ambulatory Visit
Admission: RE | Admit: 2016-12-13 | Discharge: 2016-12-13 | Disposition: A | Discharge status: Home / Self Care | Payer: Medicaid Other | Source: Ambulatory Visit | Attending: Radiation Oncology | Admitting: Radiation Oncology

## 2016-12-13 DIAGNOSIS — Z510 Encounter for antineoplastic radiation therapy: Secondary | ICD-10-CM | POA: Diagnosis not present

## 2016-12-15 ENCOUNTER — Encounter: Payer: Self-pay | Admitting: Nurse Practitioner

## 2016-12-16 ENCOUNTER — Ambulatory Visit
Admission: RE | Admit: 2016-12-16 | Discharge: 2016-12-16 | Disposition: A | Discharge status: Home / Self Care | Payer: Medicaid Other | Source: Ambulatory Visit | Attending: Radiation Oncology | Admitting: Radiation Oncology

## 2016-12-16 DIAGNOSIS — Z510 Encounter for antineoplastic radiation therapy: Secondary | ICD-10-CM | POA: Diagnosis not present

## 2016-12-17 ENCOUNTER — Ambulatory Visit
Admission: RE | Admit: 2016-12-17 | Discharge: 2016-12-17 | Disposition: A | Discharge status: Home / Self Care | Payer: Medicaid Other | Source: Ambulatory Visit | Attending: Radiation Oncology | Admitting: Radiation Oncology

## 2016-12-17 DIAGNOSIS — Z510 Encounter for antineoplastic radiation therapy: Secondary | ICD-10-CM | POA: Diagnosis not present

## 2016-12-18 ENCOUNTER — Encounter: Payer: Self-pay | Admitting: Nurse Practitioner

## 2016-12-18 ENCOUNTER — Other Ambulatory Visit (INDEPENDENT_AMBULATORY_CARE_PROVIDER_SITE_OTHER): Payer: Medicaid Other

## 2016-12-18 ENCOUNTER — Ambulatory Visit
Admission: RE | Admit: 2016-12-18 | Discharge: 2016-12-18 | Disposition: A | Discharge status: Home / Self Care | Payer: Medicaid Other | Source: Ambulatory Visit | Attending: Radiation Oncology | Admitting: Radiation Oncology

## 2016-12-18 ENCOUNTER — Ambulatory Visit: Payer: Medicaid Other | Admitting: Nurse Practitioner

## 2016-12-18 VITALS — BP 140/88 | HR 63 | Temp 98.3°F | Ht 66.5 in | Wt 225.0 lb

## 2016-12-18 DIAGNOSIS — Z6835 Body mass index (BMI) 35.0-35.9, adult: Secondary | ICD-10-CM

## 2016-12-18 DIAGNOSIS — E669 Obesity, unspecified: Secondary | ICD-10-CM

## 2016-12-18 DIAGNOSIS — Z510 Encounter for antineoplastic radiation therapy: Secondary | ICD-10-CM | POA: Diagnosis not present

## 2016-12-18 DIAGNOSIS — Q6211 Congenital occlusion of ureteropelvic junction: Secondary | ICD-10-CM | POA: Diagnosis not present

## 2016-12-18 DIAGNOSIS — Q6239 Other obstructive defects of renal pelvis and ureter: Secondary | ICD-10-CM

## 2016-12-18 LAB — TSH: TSH: 3.08 u[IU]/mL (ref 0.35–4.50)

## 2016-12-18 LAB — HEMOGLOBIN A1C: HEMOGLOBIN A1C: 5.3 % (ref 4.6–6.5)

## 2016-12-18 NOTE — Patient Instructions (Addendum)
Please follow up with urology as discussed.  Continue healthy diet and regular exercise.  Normal TSH and Hgb A1c  We will wait till completion of radiation therapy, prior to starting phentermine.

## 2016-12-18 NOTE — Progress Notes (Signed)
Subjective:  Patient ID: Susan Morrison, female    DOB: 1970/08/10  Age: 46 y.o. MRN: [id number]  CC: Follow-up (kidney function consult) and Weight Loss (weight loss consult)   HPI  Weight Loss Management: She is currently maintaining fat diet and exercise 2-3times a week. She plans on getting a trainer. Has ongoing radiation till 01/13/17. She has completed chemotherapy. Last echocardiogram done 07/2016: normal compared to previous echo 05/2016.  UPJ obstruction: She will also like to know if she needs to f/up with urology. Denies any urinary symptoms. Last OV with urology was 04/2016, recommended to return to stent placement after completion of chemotherapy.  Outpatient Medications Prior to Visit  Medication Sig Dispense Refill  . meloxicam (MOBIC) 15 MG tablet Take 1 tablet (15 mg total) by mouth 2 (two) times daily. 60 tablet 3  . Multiple Vitamins-Minerals (WOMENS MULTI VITAMIN & MINERAL) TABS Take 1 tablet by mouth daily after breakfast. 30 tablet 3  . acetaminophen (TYLENOL) 500 MG tablet Take 1,000 mg by mouth every 6 (six) hours as needed for moderate pain.    . cycloSPORINE (RESTASIS) 0.05 % ophthalmic emulsion Place 1 drop into both eyes 2 (two) times daily. (Patient not taking: Reported on 12/18/2016) 1 each 1  . ferrous sulfate 325 (65 FE) MG EC tablet Take 325 mg by mouth.    Marland Kitchen goserelin (ZOLADEX) 3.6 MG injection Inject 3.6 mg into the skin every 28 (twenty-eight) days.    Marland Kitchen HYDROcodone-acetaminophen (NORCO/VICODIN) 5-325 MG tablet Take 1-2 tablets by mouth every 6 (six) hours as needed for moderate pain. (Patient not taking: Reported on 11/13/2016) 20 tablet 0  . lidocaine-prilocaine (EMLA) cream Apply to affected area once (Patient not taking: Reported on 11/13/2016) 30 g 3   No facility-administered medications prior to visit.     ROS Review of Systems  Constitutional: Negative for chills, fever, malaise/fatigue and weight loss.  Cardiovascular: Negative.     Gastrointestinal: Negative.   Genitourinary: Negative.   Musculoskeletal: Negative.   Skin: Negative.   Psychiatric/Behavioral: Negative for depression. The patient is not nervous/anxious and does not have insomnia.     Objective:  BP 140/88   Pulse 63   Temp 98.3 F (36.8 C)   Ht 5' 6.5" (1.689 m)   Wt 225 lb (102.1 kg)   SpO2 99%   BMI 35.77 kg/m   BP Readings from Last 3 Encounters:  12/18/16 140/88  12/11/16 140/85  11/14/16 110/80    Wt Readings from Last 3 Encounters:  12/18/16 225 lb (102.1 kg)  11/14/16 219 lb (99.3 kg)  10/31/16 219 lb 3.2 oz (99.4 kg)    Physical Exam  Constitutional: She is oriented to person, place, and time. No distress.  Neck: No thyromegaly present.  Cardiovascular: Normal rate.  Pulmonary/Chest: Effort normal.  Lymphadenopathy:    She has no cervical adenopathy.  Neurological: She is alert and oriented to person, place, and time.  Skin: Skin is warm and dry.  Vitals reviewed.   Lab Results  Component Value Date   WBC 4.1 08/28/2016   HGB 12.4 08/28/2016   HCT 36.5 08/28/2016   PLT 241 08/28/2016   GLUCOSE 92 08/28/2016   CHOL 101 03/05/2016   TRIG 101.0 03/05/2016   HDL 37.60 (L) 03/05/2016   LDLCALC 43 03/05/2016   ALT 40 08/28/2016   AST 31 08/28/2016   NA 139 08/28/2016   K 4.0 08/28/2016   CL 108 05/15/2016   CREATININE 0.7 08/28/2016   BUN  8.0 08/28/2016   CO2 23 08/28/2016   TSH 3.08 12/18/2016   HGBA1C 5.3 12/18/2016    Assessment & Plan:   Susan Morrison was seen today for follow-up and weight loss.  Diagnoses and all orders for this visit:  Class 2 obesity with body mass index (BMI) of 35.0 to 35.9 in adult, unspecified obesity type, unspecified whether serious comorbidity present -     TSH; Future -     Hemoglobin A1c; Future  UPJ obstruction, congenital   I am having Susan Morrison maintain her WOMENS MULTI VITAMIN & MINERAL, lidocaine-prilocaine, cycloSPORINE, meloxicam, acetaminophen, goserelin,  HYDROcodone-acetaminophen, and ferrous sulfate.  No orders of the defined types were placed in this encounter.   Follow-up: Return in about 8 weeks (around 02/10/2017) for weight loss (discuss use of medication).  Wilfred Lacy, NP

## 2016-12-19 ENCOUNTER — Ambulatory Visit: Payer: Medicaid Other

## 2016-12-19 ENCOUNTER — Ambulatory Visit
Admission: RE | Admit: 2016-12-19 | Discharge: 2016-12-19 | Disposition: A | Discharge status: Home / Self Care | Payer: Medicaid Other | Source: Ambulatory Visit | Attending: Radiation Oncology | Admitting: Radiation Oncology

## 2016-12-19 ENCOUNTER — Encounter: Payer: Self-pay | Admitting: Physical Therapy

## 2016-12-19 DIAGNOSIS — Z510 Encounter for antineoplastic radiation therapy: Secondary | ICD-10-CM | POA: Diagnosis not present

## 2016-12-20 ENCOUNTER — Ambulatory Visit: Payer: Self-pay | Admitting: Nurse Practitioner

## 2016-12-20 ENCOUNTER — Ambulatory Visit
Admission: RE | Admit: 2016-12-20 | Discharge: 2016-12-20 | Disposition: A | Discharge status: Home / Self Care | Payer: Medicaid Other | Source: Ambulatory Visit | Attending: Radiation Oncology | Admitting: Radiation Oncology

## 2016-12-20 DIAGNOSIS — Z510 Encounter for antineoplastic radiation therapy: Secondary | ICD-10-CM | POA: Diagnosis not present

## 2016-12-22 ENCOUNTER — Ambulatory Visit
Admission: RE | Admit: 2016-12-22 | Discharge: 2016-12-22 | Disposition: A | Discharge status: Home / Self Care | Payer: Medicaid Other | Source: Ambulatory Visit | Attending: Radiation Oncology | Admitting: Radiation Oncology

## 2016-12-22 DIAGNOSIS — Z510 Encounter for antineoplastic radiation therapy: Secondary | ICD-10-CM | POA: Diagnosis not present

## 2016-12-23 ENCOUNTER — Ambulatory Visit
Admission: RE | Admit: 2016-12-23 | Discharge: 2016-12-23 | Disposition: A | Discharge status: Home / Self Care | Payer: Medicaid Other | Source: Ambulatory Visit | Attending: Radiation Oncology | Admitting: Radiation Oncology

## 2016-12-23 DIAGNOSIS — Z510 Encounter for antineoplastic radiation therapy: Secondary | ICD-10-CM | POA: Diagnosis not present

## 2016-12-24 ENCOUNTER — Ambulatory Visit
Admission: RE | Admit: 2016-12-24 | Discharge: 2016-12-24 | Disposition: A | Discharge status: Home / Self Care | Payer: Medicaid Other | Source: Ambulatory Visit | Attending: Radiation Oncology | Admitting: Radiation Oncology

## 2016-12-24 DIAGNOSIS — Z510 Encounter for antineoplastic radiation therapy: Secondary | ICD-10-CM | POA: Diagnosis not present

## 2016-12-25 ENCOUNTER — Ambulatory Visit
Admission: RE | Admit: 2016-12-25 | Discharge: 2016-12-25 | Disposition: A | Discharge status: Home / Self Care | Payer: Medicaid Other | Source: Ambulatory Visit | Attending: Radiation Oncology | Admitting: Radiation Oncology

## 2016-12-25 DIAGNOSIS — Z510 Encounter for antineoplastic radiation therapy: Secondary | ICD-10-CM | POA: Diagnosis not present

## 2016-12-30 ENCOUNTER — Ambulatory Visit
Admission: RE | Admit: 2016-12-30 | Discharge: 2016-12-30 | Disposition: A | Discharge status: Home / Self Care | Payer: Medicaid Other | Source: Ambulatory Visit | Attending: Radiation Oncology | Admitting: Radiation Oncology

## 2016-12-30 ENCOUNTER — Encounter: Payer: Medicaid Other | Admitting: Physical Therapy

## 2016-12-30 DIAGNOSIS — Z510 Encounter for antineoplastic radiation therapy: Secondary | ICD-10-CM | POA: Diagnosis not present

## 2016-12-30 DIAGNOSIS — C50312 Malignant neoplasm of lower-inner quadrant of left female breast: Secondary | ICD-10-CM

## 2016-12-30 DIAGNOSIS — Z171 Estrogen receptor negative status [ER-]: Principal | ICD-10-CM

## 2016-12-31 ENCOUNTER — Ambulatory Visit
Admission: RE | Admit: 2016-12-31 | Discharge: 2016-12-31 | Disposition: A | Discharge status: Home / Self Care | Payer: Medicaid Other | Source: Ambulatory Visit | Attending: Radiation Oncology | Admitting: Radiation Oncology

## 2016-12-31 DIAGNOSIS — Z171 Estrogen receptor negative status [ER-]: Principal | ICD-10-CM

## 2016-12-31 DIAGNOSIS — C50312 Malignant neoplasm of lower-inner quadrant of left female breast: Secondary | ICD-10-CM

## 2016-12-31 DIAGNOSIS — Z510 Encounter for antineoplastic radiation therapy: Secondary | ICD-10-CM | POA: Diagnosis not present

## 2016-12-31 MED ORDER — RADIAPLEXRX EX GEL
Freq: Once | CUTANEOUS | Status: AC
  Administered 2016-12-31: 09:00:00 via TOPICAL

## 2017-01-01 ENCOUNTER — Ambulatory Visit
Admission: RE | Admit: 2017-01-01 | Discharge: 2017-01-01 | Disposition: A | Discharge status: Home / Self Care | Payer: Medicaid Other | Source: Ambulatory Visit | Attending: Radiation Oncology | Admitting: Radiation Oncology

## 2017-01-01 DIAGNOSIS — Z510 Encounter for antineoplastic radiation therapy: Secondary | ICD-10-CM | POA: Diagnosis not present

## 2017-01-02 ENCOUNTER — Ambulatory Visit
Admission: RE | Admit: 2017-01-02 | Discharge: 2017-01-02 | Disposition: A | Discharge status: Home / Self Care | Payer: Medicaid Other | Source: Ambulatory Visit | Attending: Radiation Oncology | Admitting: Radiation Oncology

## 2017-01-02 DIAGNOSIS — Z510 Encounter for antineoplastic radiation therapy: Secondary | ICD-10-CM | POA: Diagnosis not present

## 2017-01-03 ENCOUNTER — Ambulatory Visit
Admission: RE | Admit: 2017-01-03 | Discharge: 2017-01-03 | Disposition: A | Discharge status: Home / Self Care | Payer: Medicaid Other | Source: Ambulatory Visit | Attending: Radiation Oncology | Admitting: Radiation Oncology

## 2017-01-03 DIAGNOSIS — Z510 Encounter for antineoplastic radiation therapy: Secondary | ICD-10-CM | POA: Diagnosis not present

## 2017-01-06 ENCOUNTER — Ambulatory Visit
Admission: RE | Admit: 2017-01-06 | Discharge: 2017-01-06 | Disposition: A | Discharge status: Home / Self Care | Payer: Medicaid Other | Source: Ambulatory Visit | Attending: Radiation Oncology | Admitting: Radiation Oncology

## 2017-01-06 ENCOUNTER — Ambulatory Visit: Payer: Medicaid Other

## 2017-01-06 DIAGNOSIS — Z510 Encounter for antineoplastic radiation therapy: Secondary | ICD-10-CM | POA: Diagnosis not present

## 2017-01-07 ENCOUNTER — Ambulatory Visit (HOSPITAL_BASED_OUTPATIENT_CLINIC_OR_DEPARTMENT_OTHER): Payer: Medicaid Other

## 2017-01-07 ENCOUNTER — Other Ambulatory Visit: Payer: Self-pay | Admitting: Radiation Oncology

## 2017-01-07 ENCOUNTER — Telehealth: Payer: Self-pay | Admitting: Oncology

## 2017-01-07 ENCOUNTER — Ambulatory Visit
Admission: RE | Admit: 2017-01-07 | Discharge: 2017-01-07 | Disposition: A | Discharge status: Home / Self Care | Payer: Medicaid Other | Source: Ambulatory Visit | Attending: Radiation Oncology | Admitting: Radiation Oncology

## 2017-01-07 VITALS — BP 143/83 | HR 62 | Temp 98.1°F | Resp 18

## 2017-01-07 DIAGNOSIS — Z5111 Encounter for antineoplastic chemotherapy: Secondary | ICD-10-CM

## 2017-01-07 DIAGNOSIS — C50312 Malignant neoplasm of lower-inner quadrant of left female breast: Secondary | ICD-10-CM

## 2017-01-07 DIAGNOSIS — C773 Secondary and unspecified malignant neoplasm of axilla and upper limb lymph nodes: Secondary | ICD-10-CM | POA: Diagnosis not present

## 2017-01-07 DIAGNOSIS — D500 Iron deficiency anemia secondary to blood loss (chronic): Secondary | ICD-10-CM

## 2017-01-07 DIAGNOSIS — Z510 Encounter for antineoplastic radiation therapy: Secondary | ICD-10-CM | POA: Diagnosis not present

## 2017-01-07 DIAGNOSIS — Z95828 Presence of other vascular implants and grafts: Secondary | ICD-10-CM

## 2017-01-07 MED ORDER — HYDROCODONE-ACETAMINOPHEN 5-325 MG PO TABS: 1.0000 | ORAL_TABLET | Freq: Four times a day (QID) | ORAL | 0 refills | Status: DC | PRN

## 2017-01-07 MED ORDER — GOSERELIN ACETATE 3.6 MG ~~LOC~~ IMPL
3.6000 mg | DRUG_IMPLANT | Freq: Once | SUBCUTANEOUS | Status: AC
  Administered 2017-01-07: 3.6 mg via SUBCUTANEOUS
  Filled 2017-01-07: qty 3.6

## 2017-01-07 NOTE — Telephone Encounter (Signed)
Left a message for patient letting her know that her pain medication prescription is available for pick up in the radiation nursing area.

## 2017-01-08 ENCOUNTER — Ambulatory Visit
Admission: RE | Admit: 2017-01-08 | Discharge: 2017-01-08 | Disposition: A | Discharge status: Home / Self Care | Payer: Medicaid Other | Source: Ambulatory Visit | Attending: Radiation Oncology | Admitting: Radiation Oncology

## 2017-01-08 DIAGNOSIS — Z510 Encounter for antineoplastic radiation therapy: Secondary | ICD-10-CM | POA: Diagnosis not present

## 2017-01-09 ENCOUNTER — Ambulatory Visit
Admission: RE | Admit: 2017-01-09 | Discharge: 2017-01-09 | Disposition: A | Discharge status: Home / Self Care | Payer: Medicaid Other | Source: Ambulatory Visit | Attending: Radiation Oncology | Admitting: Radiation Oncology

## 2017-01-09 DIAGNOSIS — Z510 Encounter for antineoplastic radiation therapy: Secondary | ICD-10-CM | POA: Diagnosis not present

## 2017-01-10 ENCOUNTER — Ambulatory Visit
Admission: RE | Admit: 2017-01-10 | Discharge: 2017-01-10 | Disposition: A | Discharge status: Home / Self Care | Payer: Medicaid Other | Source: Ambulatory Visit | Attending: Radiation Oncology | Admitting: Radiation Oncology

## 2017-01-10 DIAGNOSIS — Z510 Encounter for antineoplastic radiation therapy: Secondary | ICD-10-CM | POA: Diagnosis not present

## 2017-01-13 ENCOUNTER — Ambulatory Visit: Payer: Medicaid Other

## 2017-01-14 ENCOUNTER — Ambulatory Visit
Admission: RE | Admit: 2017-01-14 | Discharge: 2017-01-14 | Disposition: A | Discharge status: Home / Self Care | Payer: Medicaid Other | Source: Ambulatory Visit | Attending: Radiation Oncology | Admitting: Radiation Oncology

## 2017-01-14 ENCOUNTER — Encounter: Payer: Self-pay | Admitting: Radiation Oncology

## 2017-01-14 ENCOUNTER — Ambulatory Visit: Payer: Medicaid Other

## 2017-01-14 ENCOUNTER — Other Ambulatory Visit (INDEPENDENT_AMBULATORY_CARE_PROVIDER_SITE_OTHER): Payer: Self-pay

## 2017-01-14 ENCOUNTER — Other Ambulatory Visit: Payer: Self-pay | Admitting: Radiation Oncology

## 2017-01-14 DIAGNOSIS — Z510 Encounter for antineoplastic radiation therapy: Secondary | ICD-10-CM | POA: Diagnosis not present

## 2017-01-14 MED ORDER — CEPHALEXIN 500 MG PO CAPS: 500.0000 mg | ORAL_CAPSULE | Freq: Two times a day (BID) | ORAL | 0 refills | Status: DC

## 2017-01-14 MED ORDER — HYDROCODONE-ACETAMINOPHEN 5-325 MG PO TABS: 1.0000 | ORAL_TABLET | Freq: Four times a day (QID) | ORAL | 0 refills | Status: DC | PRN

## 2017-01-15 NOTE — Progress Notes (Signed)
  Radiation Oncology         [phone] ________________________________  Name: Susan Morrison MRN: [id number]  Date: 01/14/2017  DOB: 15-Apr-1970  End of Treatment Note  Diagnosis:   Malignant neoplasm of lower inner quadrant of left breast, estrogen receptor negative ([place])     Indication for treatment:  Curative, breast conservation therapy      Radiation treatment dates:   12/04/16 - 01/14/17  Site/dose:   Left breast // 50.4 Gy in 28 fx  Beams/energy:   Photon // 3D  Narrative: The patient tolerated radiation treatment relatively well. The patient denied any fatigue, her skin is intact and no issues. Patient reported using Radiplex as directed. Some hyperpigmentation changes noted in the breast, no  Skin breakdown.  Plan: The patient has completed radiation treatment. The patient will return to radiation oncology clinic for routine followup in one month. I advised them to call or return sooner if they have any questions or concerns related to their recovery or treatment.  -----------------------------------  Blair Promise, PhD, MD  This document serves as a record of services personally performed by Gery Pray MD. It was created on his behalf by Delton Coombes, a trained medical scribe. The creation of this record is based on the scribe's personal observations and the provider's statements to them.

## 2017-01-17 ENCOUNTER — Ambulatory Visit: Payer: Medicaid Other | Attending: Hematology and Oncology | Admitting: Physical Therapy

## 2017-01-17 ENCOUNTER — Other Ambulatory Visit: Payer: Self-pay | Admitting: Urology

## 2017-01-29 NOTE — Telephone Encounter (Signed)
trasnferred to Elmo Putt Rn

## 2017-02-03 ENCOUNTER — Other Ambulatory Visit: Payer: Self-pay | Admitting: Radiation Oncology

## 2017-02-03 ENCOUNTER — Telehealth: Payer: Self-pay | Admitting: Oncology

## 2017-02-03 MED ORDER — HYDROCODONE-ACETAMINOPHEN 5-325 MG PO TABS: 1.0000 | ORAL_TABLET | Freq: Four times a day (QID) | ORAL | 0 refills | Status: DC | PRN

## 2017-02-03 NOTE — Telephone Encounter (Signed)
Called patient and let her know the refill for hydrocodone/acetaminophen is available for pick up.  She said that she may not need it as she is just taking it PRN.  She will pick it up at her follow up if needed.

## 2017-02-04 DIAGNOSIS — Z9289 Personal history of other medical treatment: Secondary | ICD-10-CM

## 2017-02-04 HISTORY — DX: Personal history of other medical treatment: Z92.89

## 2017-02-12 ENCOUNTER — Encounter: Payer: Self-pay | Admitting: Nurse Practitioner

## 2017-02-12 ENCOUNTER — Ambulatory Visit: Payer: Medicaid Other | Admitting: Nurse Practitioner

## 2017-02-12 ENCOUNTER — Telehealth: Payer: Self-pay | Admitting: Nurse Practitioner

## 2017-02-12 VITALS — BP 122/86 | HR 71 | Temp 98.1°F | Ht 66.5 in | Wt 221.0 lb

## 2017-02-12 DIAGNOSIS — Z713 Dietary counseling and surveillance: Secondary | ICD-10-CM | POA: Diagnosis not present

## 2017-02-12 DIAGNOSIS — M25531 Pain in right wrist: Secondary | ICD-10-CM | POA: Diagnosis not present

## 2017-02-12 DIAGNOSIS — E669 Obesity, unspecified: Secondary | ICD-10-CM | POA: Diagnosis not present

## 2017-02-12 DIAGNOSIS — Z6835 Body mass index (BMI) 35.0-35.9, adult: Secondary | ICD-10-CM | POA: Diagnosis not present

## 2017-02-12 MED ORDER — PHENTERMINE HCL 15 MG PO CAPS: 15.0000 mg | ORAL_CAPSULE | ORAL | 0 refills | Status: DC

## 2017-02-12 MED ORDER — DICLOFENAC SODIUM 2 % TD SOLN
1.0000 [in_us] | Freq: Two times a day (BID) | TRANSDERMAL | 0 refills | Status: DC
Start: 2017-02-12 — End: 2017-02-13

## 2017-02-12 NOTE — Patient Instructions (Signed)
Use wrist brace x24hrs x 1week, then during day only for 1week.  Managing pain, stiffness, and swelling  If directed, apply ice to the affected area. ? Put ice in a plastic bag. ? Place a towel between your skin and the bag. ? Leave the ice on for 20 minutes, 2-3 times a day.  Move the fingers or toes of the affected limb often, if this applies. This can help to prevent stiffness and lessen swelling.  If directed, raise (elevate) the affected area above the level of your heart while you are sitting or lying down.  If directed, apply heat to the affected area as often as told by your health care provider. Use the heat source that your health care provider recommends, such as a moist heat pack or a heating pad. ? Place a towel between your skin and the heat source. ? Leave the heat on for 20-30 minutes. ? Remove the heat if your skin turns bright red. This is especially important if you are unable to feel pain, heat, or cold. You may have a greater risk of getting burned.  Maintain heart healthy diet Exercising to Lose Weight Exercising can help you to lose weight. In order to lose weight through exercise, you need to do vigorous-intensity exercise. You can tell that you are exercising with vigorous intensity if you are breathing very hard and fast and cannot hold a conversation while exercising. Moderate-intensity exercise helps to maintain your current weight. You can tell that you are exercising at a moderate level if you have a higher heart rate and faster breathing, but you are still able to hold a conversation. How often should I exercise? Choose an activity that you enjoy and set realistic goals. Your health care provider can help you to make an activity plan that works for you. Exercise regularly as directed by your health care provider. This may include:  Doing resistance training twice each week, such as: ? Push-ups. ? Sit-ups. ? Lifting weights. ? Using resistance  bands.  Doing a given intensity of exercise for a given amount of time. Choose from these options: ? 150 minutes of moderate-intensity exercise every week. ? 75 minutes of vigorous-intensity exercise every week. ? A mix of moderate-intensity and vigorous-intensity exercise every week.  Children, pregnant women, people who are out of shape, people who are overweight, and older adults may need to consult a health care provider for individual recommendations. If you have any sort of medical condition, be sure to consult your health care provider before starting a new exercise program. What are some activities that can help me to lose weight?  Walking at a rate of at least 4.5 miles an hour.  Jogging or running at a rate of 5 miles per hour.  Biking at a rate of at least 10 miles per hour.  Lap swimming.  Roller-skating or in-line skating.  Cross-country skiing.  Vigorous competitive sports, such as football, basketball, and soccer.  Jumping rope.  Aerobic dancing. How can I be more active in my day-to-day activities?  Use the stairs instead of the elevator.  Take a walk during your lunch break.  If you drive, park your car farther away from work or school.  If you take public transportation, get off one stop early and walk the rest of the way.  Make all of your phone calls while standing up and walking around.  Get up, stretch, and walk around every 30 minutes throughout the day. What guidelines should I  follow while exercising?  Do not exercise so much that you hurt yourself, feel dizzy, or get very short of breath.  Consult your health care provider prior to starting a new exercise program.  Wear comfortable clothes and shoes with good support.  Drink plenty of water while you exercise to prevent dehydration or heat stroke. Body water is lost during exercise and must be replaced.  Work out until you breathe faster and your heart beats faster. This information is not  intended to replace advice given to you by your health care provider. Make sure you discuss any questions you have with your health care provider. Document Released: 02/23/2010 Document Revised: 06/29/2015 Document Reviewed: 06/24/2013 Elsevier Interactive Patient Education  [employer].

## 2017-02-12 NOTE — Telephone Encounter (Signed)
Copied from [place] [id number]. Topic: Quick Communication - Rx Refill/Question >> Feb 12, 2017 11:23 AM Susan Morrison wrote: Medicaid does not cover Rx (Diclofenac Sodium (PENNSAID) 2 % SOLN [[id number]] ) and the coupon cannot be used Morrison/c it excludes medicaid; change medication and send to the [facility] pharmacy on file

## 2017-02-12 NOTE — Telephone Encounter (Signed)
Pt stated [facility] does not take medicaid. Okey to send to [facility]? She can use the coupon you gave her with it.

## 2017-02-12 NOTE — Progress Notes (Addendum)
Subjective:  Patient ID: Susan Morrison, female    DOB: 11/26/1970  Age: 47 y.o. MRN: [id number]  CC: Follow-up (8 wk follow up/ weight loss consult/ FYI has kidney surgery on 1/30)   Wrist Pain   The pain is present in the right wrist. This is a chronic problem. The current episode started more than 1 month ago. There has been no history of extremity trauma. The problem occurs constantly. The problem has been gradually worsening. The quality of the pain is described as aching. Associated symptoms include an inability to bear weight and a limited range of motion. Pertinent negatives include no fever, itching, joint locking, joint swelling, numbness, stiffness or tingling. The symptoms are aggravated by activity and contact. She has tried nothing for the symptoms. Family history does not include gout or rheumatoid arthritis. There is no history of diabetes, gout, osteoarthritis or rheumatoid arthritis.   Weight loss: completed radiation 01/13/2017 Started healthy diet and exercise (aerobic and zumba class 3time a week) Weight loss goal 185Lbs, 6Ibs per month. Last echocardiogram done 07/2016: normal compared to previous echo 05/2016.  Outpatient Medications Prior to Visit  Medication Sig Dispense Refill  . acetaminophen (TYLENOL) 500 MG tablet Take 1,000 mg by mouth every 6 (six) hours as needed for moderate pain.    . cephALEXin (KEFLEX) 500 MG capsule Take 1 capsule (500 mg total) by mouth 2 (two) times daily. 14 capsule 0  . ferrous sulfate 325 (65 FE) MG EC tablet Take 325 mg by mouth.    Marland Kitchen goserelin (ZOLADEX) 3.6 MG injection Inject 3.6 mg into the skin every 28 (twenty-eight) days.    Marland Kitchen HYDROcodone-acetaminophen (NORCO/VICODIN) 5-325 MG tablet Take 1-2 tablets by mouth every 6 (six) hours as needed for moderate pain. 20 tablet 0  . meloxicam (MOBIC) 15 MG tablet Take 1 tablet (15 mg total) by mouth 2 (two) times daily. 60 tablet 3  . Multiple Vitamins-Minerals (WOMENS MULTI VITAMIN &  MINERAL) TABS Take 1 tablet by mouth daily after breakfast. 30 tablet 3  . cycloSPORINE (RESTASIS) 0.05 % ophthalmic emulsion Place 1 drop into both eyes 2 (two) times daily. (Patient not taking: Reported on 12/18/2016) 1 each 1  . lidocaine-prilocaine (EMLA) cream Apply to affected area once (Patient not taking: Reported on 11/13/2016) 30 g 3   No facility-administered medications prior to visit.     ROS See HPI  Objective:  BP 122/86   Pulse 71   Temp 98.1 F (36.7 C) (Oral)   Ht 5' 6.5" (1.689 m)   Wt 221 lb (100.2 kg)   SpO2 96%   BMI 35.14 kg/m   BP Readings from Last 3 Encounters:  02/12/17 122/86  01/07/17 (!) 143/83  12/18/16 140/88    Wt Readings from Last 3 Encounters:  02/12/17 221 lb (100.2 kg)  12/18/16 225 lb (102.1 kg)  11/14/16 219 lb (99.3 kg)    Physical Exam  Constitutional: She is oriented to person, place, and time. No distress.  Cardiovascular: Normal rate, regular rhythm, normal heart sounds and intact distal pulses.  Pulmonary/Chest: Effort normal and breath sounds normal.  Musculoskeletal: She exhibits tenderness. She exhibits no edema.       Right wrist: She exhibits decreased range of motion and tenderness. She exhibits no bony tenderness, no effusion and no laceration.       Arms: Neurological: She is alert and oriented to person, place, and time.  Vitals reviewed.   Lab Results  Component Value Date  WBC 4.1 08/28/2016   HGB 12.4 08/28/2016   HCT 36.5 08/28/2016   PLT 241 08/28/2016   GLUCOSE 92 08/28/2016   CHOL 101 03/05/2016   TRIG 101.0 03/05/2016   HDL 37.60 (L) 03/05/2016   LDLCALC 43 03/05/2016   ALT 40 08/28/2016   AST 31 08/28/2016   NA 139 08/28/2016   K 4.0 08/28/2016   CL 108 05/15/2016   CREATININE 0.7 08/28/2016   BUN 8.0 08/28/2016   CO2 23 08/28/2016   TSH 3.08 12/18/2016   HGBA1C 5.3 12/18/2016    No results found.  Assessment & Plan:   Susan Morrison was seen today for follow-up.  Diagnoses and all  orders for this visit:  Encounter for weight loss counseling -     phentermine 15 MG capsule; Take 1 capsule (15 mg total) by mouth every morning.  Class 2 obesity without serious comorbidity with body mass index (BMI) of 35.0 to 35.9 in adult, unspecified obesity type -     phentermine 15 MG capsule; Take 1 capsule (15 mg total) by mouth every morning.  Right wrist pain -     Discontinue: Diclofenac Sodium (PENNSAID) 2 % SOLN; Place 1 inch onto the skin 2 (two) times daily. -     diclofenac sodium (VOLTAREN) 1 % GEL; Apply 2 g topically 2 times daily at 12 noon and 4 pm.   I have discontinued Susan Morrison's lidocaine-prilocaine, cycloSPORINE, and Diclofenac Sodium. I am also having her start on phentermine and diclofenac sodium. Additionally, I am having her maintain her WOMENS MULTI VITAMIN & MINERAL, meloxicam, acetaminophen, goserelin, ferrous sulfate, cephALEXin, and HYDROcodone-acetaminophen.  Meds ordered this encounter  Medications  . phentermine 15 MG capsule    Sig: Take 1 capsule (15 mg total) by mouth every morning.    Dispense:  30 capsule    Refill:  0    Order Specific Question:   Supervising Provider    Answer:   Lucille Passy [[id number]]  . DISCONTD: Diclofenac Sodium (PENNSAID) 2 % SOLN    Sig: Place 1 inch onto the skin 2 (two) times daily.    Dispense:  112 g    Refill:  0    Order Specific Question:   Supervising Provider    Answer:   Lucille Passy [[id number]]  . diclofenac sodium (VOLTAREN) 1 % GEL    Sig: Apply 2 g topically 2 times daily at 12 noon and 4 pm.    Dispense:  100 g    Refill:  0    Order Specific Question:   Supervising Provider    Answer:   Lucille Passy [[id number]]    Follow-up: Return in about 4 weeks (around 03/12/2017) for weight loss.  Wilfred Lacy, NP

## 2017-02-13 ENCOUNTER — Encounter (HOSPITAL_COMMUNITY): Payer: Self-pay | Admitting: Surgery

## 2017-02-13 MED ORDER — DICLOFENAC SODIUM 1 % TD GEL: 2.0000 g | Freq: Two times a day (BID) | TRANSDERMAL | 0 refills | Status: DC

## 2017-02-13 NOTE — Addendum Note (Signed)
Addended by: Wilfred Lacy L on: 02/13/2017 01:23 PM   Modules accepted: Orders

## 2017-02-13 NOTE — Telephone Encounter (Signed)
Ok will change rx

## 2017-02-14 ENCOUNTER — Telehealth: Payer: Self-pay | Admitting: Oncology

## 2017-02-14 ENCOUNTER — Encounter: Payer: Self-pay | Admitting: Oncology

## 2017-02-17 ENCOUNTER — Encounter: Payer: Self-pay | Admitting: Radiation Oncology

## 2017-02-17 ENCOUNTER — Ambulatory Visit
Admission: RE | Admit: 2017-02-17 | Discharge: 2017-02-17 | Disposition: A | Discharge status: Home / Self Care | Payer: Medicaid Other | Source: Ambulatory Visit | Attending: Radiation Oncology | Admitting: Radiation Oncology

## 2017-02-17 DIAGNOSIS — Z791 Long term (current) use of non-steroidal anti-inflammatories (NSAID): Secondary | ICD-10-CM | POA: Diagnosis not present

## 2017-02-17 DIAGNOSIS — R200 Anesthesia of skin: Secondary | ICD-10-CM | POA: Diagnosis not present

## 2017-02-17 DIAGNOSIS — L818 Other specified disorders of pigmentation: Secondary | ICD-10-CM | POA: Diagnosis not present

## 2017-02-17 DIAGNOSIS — C50312 Malignant neoplasm of lower-inner quadrant of left female breast: Secondary | ICD-10-CM | POA: Diagnosis present

## 2017-02-17 DIAGNOSIS — Z171 Estrogen receptor negative status [ER-]: Secondary | ICD-10-CM | POA: Insufficient documentation

## 2017-02-17 DIAGNOSIS — Y842 Radiological procedure and radiotherapy as the cause of abnormal reaction of the patient, or of later complication, without mention of misadventure at the time of the procedure: Secondary | ICD-10-CM | POA: Diagnosis not present

## 2017-02-17 DIAGNOSIS — Z923 Personal history of irradiation: Secondary | ICD-10-CM | POA: Insufficient documentation

## 2017-02-17 DIAGNOSIS — Z79899 Other long term (current) drug therapy: Secondary | ICD-10-CM | POA: Diagnosis not present

## 2017-02-17 NOTE — Progress Notes (Signed)
Radiation Oncology         [phone] ________________________________  Name: Susan Morrison MRN: [id number]  Date: 02/17/2017  DOB: 10/08/1970  Follow-Up Visit Note  CC: Nche, Charlene Brooke, NP  Nicholas Lose, MD    ICD-10-CM   1. Malignant neoplasm of lower-inner quadrant of left breast in female, estrogen receptor negative ([place]) C50.312    Z17.1     Diagnosis:   47 y.o. women with malignant neoplasm of lower inner quadrant of left breast, estrogen receptor negative ([place]).   Interval Since Last Radiation:  1 months 12/04/16 - 01/14/17; 50.4 Gy was given to the LEFT breast in 28 fractions. No boost was given.  Narrative:  The patient returns today for routine follow-up.  Susan Morrison is here for follow up after treatment to her left breast.  She denies having pain and fatigue. The only problem she continues to have is numbness on the back of her left arm.  The skin on her left neck and breast has hyperpigmentation. She reported having an occasional itch on her breast. She is using vitamin E oil.                              ALLERGIES:  has No Known Allergies.  Meds: Current Outpatient Medications  Medication Sig Dispense Refill  . acetaminophen (TYLENOL) 500 MG tablet Take 1,000 mg by mouth every 6 (six) hours as needed for moderate pain.    Marland Kitchen diclofenac sodium (VOLTAREN) 1 % GEL Apply 2 g topically 2 times daily at 12 noon and 4 pm. 100 g 0  . Multiple Vitamins-Minerals (WOMENS MULTI VITAMIN & MINERAL) TABS Take 1 tablet by mouth daily after breakfast. 30 tablet 3  . ferrous sulfate 325 (65 FE) MG EC tablet Take 325 mg by mouth.    Marland Kitchen goserelin (ZOLADEX) 3.6 MG injection Inject 3.6 mg into the skin every 28 (twenty-eight) days.    . meloxicam (MOBIC) 15 MG tablet Take 1 tablet (15 mg total) by mouth 2 (two) times daily. (Patient not taking: Reported on 02/17/2017) 60 tablet 3  . phentermine 15 MG capsule Take 1 capsule (15 mg total) by mouth every morning. (Patient not taking:  Reported on 02/17/2017) 30 capsule 0   No current facility-administered medications for this encounter.     Physical Findings: The patient is in no acute distress. Patient is alert and oriented.  height is 5' 6.5" (1.689 m) and weight is 222 lb 9.6 oz (101 kg). Her oral temperature is 98.7 F (37.1 C). Her blood pressure is 126/83 and her pulse is 67. Her oxygen saturation is 98%. .   Lungs are clear to auscultation bilaterally. Heart has regular rate and rhythm. No palpable cervical, supraclavicular, or axillary adenopathy. Abdomen soft, non-tender, normal bowel sounds. Left breast shows some mild to moderate hyperpigmented changes and mild edema in the nipple area. Some scar tissue noted along the lumpectomy scar.    Lab Findings: Lab Results  Component Value Date   WBC 4.1 08/28/2016   HGB 12.4 08/28/2016   HCT 36.5 08/28/2016   MCV 75.3 (L) 08/28/2016   PLT 241 08/28/2016    Radiographic Findings: No results found.  Impression:  The patient is recovering from the effects of radiation. No evidence of reoccurrence in the clinical exam.   Plan:  PRN follow up with Radiation oncology and will continue close follow up with Medical oncology and surgery.   ____________________________________  Blair Promise, PhD, MD    This document serves as a record of services personally performed by Gery Pray MD. It was created on his behalf by Delton Coombes, a trained medical scribe. The creation of this record is based on the scribe's personal observations and the provider's statements to them.

## 2017-02-17 NOTE — Progress Notes (Signed)
Susan Morrison is here for follow up after treatment to her left breast.  She denies having pain and fatigue.  The only problem she continues to have is numbness on the back of her left arm.  The skin on her left neck and breast has hyperpigmentation.  She is using vitamin E oil.  BP 126/83 (BP Location: Right Arm, Patient Position: Sitting)   Pulse 67   Temp 98.7 F (37.1 C) (Oral)   Ht 5' 6.5" (1.689 m)   Wt 222 lb 9.6 oz (101 kg)   SpO2 98%   BMI 35.39 kg/m    Wt Readings from Last 3 Encounters:  02/17/17 222 lb 9.6 oz (101 kg)  02/12/17 221 lb (100.2 kg)  12/18/16 225 lb (102.1 kg)

## 2017-02-25 ENCOUNTER — Encounter (HOSPITAL_COMMUNITY): Payer: Self-pay | Admitting: *Deleted

## 2017-02-26 NOTE — Patient Instructions (Addendum)
Susan Morrison  02/26/2017   Your procedure is scheduled on: 03/05/2017   Report to [facility] Main  Entrance  Report to admitting at   1000 AM   Call this number if you have problems the morning of surgery [phone]   Remember: Do not eat food or drink liquids :After Midnight.     Take these medicines the morning of surgery with A SIP OF WATER: none                                 You may not have any metal on your body including hair pins and              piercings  Do not wear jewelry, make-up, lotions, powders or perfumes, deodorant             Do not wear nail polish.  Do not shave  48 hours prior to surgery.            Do not bring valuables to the hospital. [place].  Contacts, dentures or bridgework may not be worn into surgery.  Leave suitcase in the car. After surgery it may be brought to your room.                     Please read over the following fact sheets you were given: _____________________________________________________________________             [facility] - Preparing for Surgery Before surgery, you can play an important role.  Because skin is not sterile, your skin needs to be as free of germs as possible.  You can reduce the number of germs on your skin by washing with CHG (chlorahexidine gluconate) soap before surgery.  CHG is an antiseptic cleaner which kills germs and bonds with the skin to continue killing germs even after washing. Please DO NOT use if you have an allergy to CHG or antibacterial soaps.  If your skin becomes reddened/irritated stop using the CHG and inform your nurse when you arrive at Short Stay. Do not shave (including legs and underarms) for at least 48 hours prior to the first CHG shower.  You may shave your face/neck. Please follow these instructions carefully:  1.  Shower with CHG Soap the night before surgery and the  morning of Surgery.  2.  If  you choose to wash your hair, wash your hair first as usual with your  normal  shampoo.  3.  After you shampoo, rinse your hair and body thoroughly to remove the  shampoo.                           4.  Use CHG as you would any other liquid soap.  You can apply chg directly  to the skin and wash                       Gently with a scrungie or clean washcloth.  5.  Apply the CHG Soap to your body ONLY FROM THE NECK DOWN.   Do not use on face/ open  Wound or open sores. Avoid contact with eyes, ears mouth and genitals (private parts).                       Wash face,  Genitals (private parts) with your normal soap.             6.  Wash thoroughly, paying special attention to the area where your surgery  will be performed.  7.  Thoroughly rinse your body with warm water from the neck down.  8.  DO NOT shower/wash with your normal soap after using and rinsing off  the CHG Soap.                9.  Pat yourself dry with a clean towel.            10.  Wear clean pajamas.            11.  Place clean sheets on your bed the night of your first shower and do not  sleep with pets. Day of Surgery : Do not apply any lotions/deodorants the morning of surgery.  Please wear clean clothes to the hospital/surgery center.  FAILURE TO FOLLOW THESE INSTRUCTIONS MAY RESULT IN THE CANCELLATION OF YOUR SURGERY PATIENT SIGNATURE_________________________________  NURSE SIGNATURE__________________________________  ________________________________________________________________________

## 2017-02-28 ENCOUNTER — Encounter (HOSPITAL_COMMUNITY): Payer: Self-pay | Admitting: *Deleted

## 2017-02-28 ENCOUNTER — Other Ambulatory Visit: Payer: Self-pay

## 2017-02-28 ENCOUNTER — Encounter (HOSPITAL_COMMUNITY)
Admission: RE | Admit: 2017-02-28 | Discharge: 2017-02-28 | Disposition: A | Discharge status: Home / Self Care | Payer: Medicaid Other | Source: Ambulatory Visit | Attending: Urology | Admitting: Urology

## 2017-02-28 DIAGNOSIS — N130 Hydronephrosis with ureteropelvic junction obstruction: Secondary | ICD-10-CM | POA: Insufficient documentation

## 2017-02-28 DIAGNOSIS — Z01812 Encounter for preprocedural laboratory examination: Secondary | ICD-10-CM | POA: Insufficient documentation

## 2017-02-28 HISTORY — DX: Unspecified osteoarthritis, unspecified site: M19.90

## 2017-02-28 HISTORY — DX: Personal history of other medical treatment: Z92.89

## 2017-02-28 HISTORY — DX: Personal history of urinary calculi: Z87.442

## 2017-02-28 LAB — BASIC METABOLIC PANEL
Anion gap: 7 (ref 5–15)
BUN: 13 mg/dL (ref 6–20)
CO2: 27 mmol/L (ref 22–32)
CREATININE: 0.59 mg/dL (ref 0.44–1.00)
Calcium: 9.4 mg/dL (ref 8.9–10.3)
Chloride: 106 mmol/L (ref 101–111)
GFR calc Af Amer: >60 mL/min (ref >60)
GFR calc non Af Amer: >60 mL/min (ref >60)
GLUCOSE: 90 mg/dL (ref 65–99)
POTASSIUM: 4.3 mmol/L (ref 3.5–5.1)
SODIUM: 140 mmol/L (ref 135–145)

## 2017-02-28 LAB — CBC
HEMATOCRIT: 41.3 % (ref 36.0–46.0)
Hemoglobin: 14.2 g/dL (ref 12.0–15.0)
MCH: 24.4 pg — AB (ref 26.0–34.0)
MCHC: 34.4 g/dL (ref 30.0–36.0)
MCV: 70.8 fL — ABNORMAL LOW (ref 78.0–100.0)
PLATELETS: 170 10*3/uL (ref 150–400)
RBC: 5.83 MIL/uL — ABNORMAL HIGH (ref 3.87–5.11)
RDW: 16.1 % — AB (ref 11.5–15.5)
WBC: 6 10*3/uL (ref 4.0–10.5)

## 2017-02-28 LAB — ABO/RH: ABO/RH(D): A NEG

## 2017-02-28 LAB — HCG, SERUM, QUALITATIVE: Preg, Serum: NEGATIVE

## 2017-03-01 LAB — URINE CULTURE: CULTURE: NO GROWTH

## 2017-03-05 ENCOUNTER — Inpatient Hospital Stay (HOSPITAL_COMMUNITY): Payer: Medicaid Other

## 2017-03-05 ENCOUNTER — Other Ambulatory Visit: Payer: Self-pay

## 2017-03-05 ENCOUNTER — Encounter (HOSPITAL_COMMUNITY)
Admission: RE | Disposition: A | Discharge status: Home Health | Payer: Self-pay | Source: Ambulatory Visit | Attending: Urology

## 2017-03-05 ENCOUNTER — Inpatient Hospital Stay (HOSPITAL_COMMUNITY): Payer: Medicaid Other | Admitting: Anesthesiology

## 2017-03-05 ENCOUNTER — Encounter (HOSPITAL_COMMUNITY): Payer: Self-pay | Admitting: *Deleted

## 2017-03-05 ENCOUNTER — Inpatient Hospital Stay (HOSPITAL_COMMUNITY)
Admission: RE | Admit: 2017-03-05 | Discharge: 2017-03-07 | DRG: 661 | Disposition: A | Discharge status: Home Health | Payer: Medicaid Other | Source: Ambulatory Visit | Attending: Urology | Admitting: Urology

## 2017-03-05 ENCOUNTER — Encounter (HOSPITAL_COMMUNITY): Payer: Self-pay | Admitting: Certified Registered Nurse Anesthetist

## 2017-03-05 DIAGNOSIS — E669 Obesity, unspecified: Secondary | ICD-10-CM | POA: Diagnosis present

## 2017-03-05 DIAGNOSIS — Z6835 Body mass index (BMI) 35.0-35.9, adult: Secondary | ICD-10-CM | POA: Diagnosis not present

## 2017-03-05 DIAGNOSIS — Z960 Presence of urogenital implants: Secondary | ICD-10-CM

## 2017-03-05 DIAGNOSIS — Z923 Personal history of irradiation: Secondary | ICD-10-CM

## 2017-03-05 DIAGNOSIS — Q6239 Other obstructive defects of renal pelvis and ureter: Principal | ICD-10-CM

## 2017-03-05 DIAGNOSIS — Z853 Personal history of malignant neoplasm of breast: Secondary | ICD-10-CM

## 2017-03-05 DIAGNOSIS — Z9221 Personal history of antineoplastic chemotherapy: Secondary | ICD-10-CM

## 2017-03-05 DIAGNOSIS — Q6211 Congenital occlusion of ureteropelvic junction: Secondary | ICD-10-CM

## 2017-03-05 HISTORY — PX: ROBOT ASSISTED PYELOPLASTY: SHX5143

## 2017-03-05 LAB — HEMOGLOBIN AND HEMATOCRIT, BLOOD
HCT: 38.5 % (ref 36.0–46.0)
HEMOGLOBIN: 13.3 g/dL (ref 12.0–15.0)

## 2017-03-05 LAB — TYPE AND SCREEN
ABO/RH(D): A NEG
Antibody Screen: NEGATIVE

## 2017-03-05 SURGERY — PYELOPLASTY, ROBOT-ASSISTED
Anesthesia: General | Laterality: Left

## 2017-03-05 MED ORDER — PROPOFOL 10 MG/ML IV BOLUS
INTRAVENOUS | Status: DC | PRN
  Administered 2017-03-05: 100 mg via INTRAVENOUS
  Administered 2017-03-05: 200 mg via INTRAVENOUS

## 2017-03-05 MED ORDER — METOCLOPRAMIDE HCL 5 MG/ML IJ SOLN
INTRAMUSCULAR | Status: AC
  Filled 2017-03-05: qty 2

## 2017-03-05 MED ORDER — FENTANYL CITRATE (PF) 100 MCG/2ML IJ SOLN
INTRAMUSCULAR | Status: AC
  Filled 2017-03-05: qty 2

## 2017-03-05 MED ORDER — ROCURONIUM BROMIDE 10 MG/ML (PF) SYRINGE
PREFILLED_SYRINGE | INTRAVENOUS | Status: DC | PRN
  Administered 2017-03-05: 50 mg via INTRAVENOUS
  Administered 2017-03-05 (×2): 10 mg via INTRAVENOUS
  Administered 2017-03-05: 20 mg via INTRAVENOUS

## 2017-03-05 MED ORDER — SODIUM CHLORIDE 0.9 % IJ SOLN
INTRAMUSCULAR | Status: DC | PRN
  Administered 2017-03-05: 40 mL

## 2017-03-05 MED ORDER — OXYCODONE HCL 5 MG/5ML PO SOLN
5.0000 mg | Freq: Once | ORAL | Status: DC | PRN
  Filled 2017-03-05: qty 5

## 2017-03-05 MED ORDER — KETOROLAC TROMETHAMINE 15 MG/ML IJ SOLN
15.0000 mg | Freq: Four times a day (QID) | INTRAMUSCULAR | Status: AC
  Administered 2017-03-05 – 2017-03-07 (×6): 15 mg via INTRAVENOUS
  Filled 2017-03-05 (×6): qty 1

## 2017-03-05 MED ORDER — METHYLENE BLUE 0.5 % INJ SOLN
INTRAVENOUS | Status: DC | PRN
  Administered 2017-03-05: 10 mL

## 2017-03-05 MED ORDER — FERROUS SULFATE 325 (65 FE) MG PO TABS
325.0000 mg | ORAL_TABLET | ORAL | Status: DC
  Administered 2017-03-05 – 2017-03-07 (×2): 325 mg via ORAL
  Filled 2017-03-05 (×2): qty 1

## 2017-03-05 MED ORDER — PHENYLEPHRINE 40 MCG/ML (10ML) SYRINGE FOR IV PUSH (FOR BLOOD PRESSURE SUPPORT)
PREFILLED_SYRINGE | INTRAVENOUS | Status: DC | PRN
  Administered 2017-03-05: 80 ug via INTRAVENOUS

## 2017-03-05 MED ORDER — BUPIVACAINE-EPINEPHRINE (PF) 0.5% -1:200000 IJ SOLN
INTRAMUSCULAR | Status: AC
  Filled 2017-03-05: qty 30

## 2017-03-05 MED ORDER — LIDOCAINE 2% (20 MG/ML) 5 ML SYRINGE
INTRAMUSCULAR | Status: DC | PRN
  Administered 2017-03-05: 100 mg via INTRAVENOUS

## 2017-03-05 MED ORDER — SCOPOLAMINE 1 MG/3DAYS TD PT72
MEDICATED_PATCH | TRANSDERMAL | Status: DC | PRN
  Administered 2017-03-05: 1 via TRANSDERMAL

## 2017-03-05 MED ORDER — ONDANSETRON HCL 4 MG/2ML IJ SOLN
INTRAMUSCULAR | Status: AC
  Filled 2017-03-05: qty 2

## 2017-03-05 MED ORDER — PROPOFOL 10 MG/ML IV BOLUS
INTRAVENOUS | Status: AC
  Filled 2017-03-05: qty 20

## 2017-03-05 MED ORDER — FENTANYL CITRATE (PF) 100 MCG/2ML IJ SOLN
INTRAMUSCULAR | Status: DC | PRN
  Administered 2017-03-05 (×2): 50 ug via INTRAVENOUS
  Administered 2017-03-05 (×2): 100 ug via INTRAVENOUS
  Administered 2017-03-05 (×2): 50 ug via INTRAVENOUS

## 2017-03-05 MED ORDER — DIPHENHYDRAMINE HCL 12.5 MG/5ML PO ELIX: 12.5000 mg | ORAL_SOLUTION | Freq: Four times a day (QID) | ORAL | Status: DC | PRN

## 2017-03-05 MED ORDER — SODIUM CHLORIDE 0.9 % IR SOLN
Status: DC | PRN
  Administered 2017-03-05: 2000 mL via INTRAVESICAL

## 2017-03-05 MED ORDER — FENTANYL CITRATE (PF) 100 MCG/2ML IJ SOLN
25.0000 ug | INTRAMUSCULAR | Status: DC | PRN
  Administered 2017-03-05 (×2): 50 ug via INTRAVENOUS

## 2017-03-05 MED ORDER — ROCURONIUM BROMIDE 50 MG/5ML IV SOSY
PREFILLED_SYRINGE | INTRAVENOUS | Status: AC
  Filled 2017-03-05: qty 5

## 2017-03-05 MED ORDER — LIDOCAINE 2% (20 MG/ML) 5 ML SYRINGE
INTRAMUSCULAR | Status: AC
  Filled 2017-03-05: qty 5

## 2017-03-05 MED ORDER — PROPOFOL 500 MG/50ML IV EMUL
INTRAVENOUS | Status: DC | PRN
  Administered 2017-03-05: 150 ug/kg/min via INTRAVENOUS

## 2017-03-05 MED ORDER — OXYCODONE HCL 5 MG PO TABS: 5.0000 mg | ORAL_TABLET | Freq: Once | ORAL | Status: DC | PRN

## 2017-03-05 MED ORDER — KETAMINE HCL 10 MG/ML IJ SOLN
INTRAMUSCULAR | Status: DC | PRN
  Administered 2017-03-05: 30 mg via INTRAVENOUS

## 2017-03-05 MED ORDER — MIDAZOLAM HCL 2 MG/2ML IJ SOLN
INTRAMUSCULAR | Status: AC
  Filled 2017-03-05: qty 2

## 2017-03-05 MED ORDER — KETOROLAC TROMETHAMINE 30 MG/ML IJ SOLN
INTRAMUSCULAR | Status: AC
  Filled 2017-03-05: qty 1

## 2017-03-05 MED ORDER — DEXAMETHASONE SODIUM PHOSPHATE 10 MG/ML IJ SOLN
INTRAMUSCULAR | Status: AC
  Filled 2017-03-05: qty 1

## 2017-03-05 MED ORDER — MIDAZOLAM HCL 5 MG/5ML IJ SOLN
INTRAMUSCULAR | Status: DC | PRN
  Administered 2017-03-05: 2 mg via INTRAVENOUS

## 2017-03-05 MED ORDER — DEXTROSE-NACL 5-0.45 % IV SOLN
INTRAVENOUS | Status: DC
  Administered 2017-03-05 – 2017-03-06 (×2): via INTRAVENOUS

## 2017-03-05 MED ORDER — TRAMADOL HCL 50 MG PO TABS: 50.0000 mg | ORAL_TABLET | Freq: Four times a day (QID) | ORAL | 0 refills | Status: DC | PRN

## 2017-03-05 MED ORDER — ONDANSETRON HCL 4 MG/2ML IJ SOLN: 4.0000 mg | INTRAMUSCULAR | Status: DC | PRN

## 2017-03-05 MED ORDER — ONDANSETRON HCL 4 MG/2ML IJ SOLN
INTRAMUSCULAR | Status: DC | PRN
  Administered 2017-03-05 (×2): 4 mg via INTRAVENOUS

## 2017-03-05 MED ORDER — KETAMINE HCL 10 MG/ML IJ SOLN
INTRAMUSCULAR | Status: AC
  Filled 2017-03-05: qty 1

## 2017-03-05 MED ORDER — ACETAMINOPHEN 10 MG/ML IV SOLN
1000.0000 mg | Freq: Four times a day (QID) | INTRAVENOUS | Status: AC
  Administered 2017-03-05 – 2017-03-06 (×4): 1000 mg via INTRAVENOUS
  Filled 2017-03-05 (×5): qty 100

## 2017-03-05 MED ORDER — SUGAMMADEX SODIUM 500 MG/5ML IV SOLN
INTRAVENOUS | Status: AC
  Filled 2017-03-05: qty 5

## 2017-03-05 MED ORDER — DIPHENHYDRAMINE HCL 50 MG/ML IJ SOLN: 12.5000 mg | Freq: Four times a day (QID) | INTRAMUSCULAR | Status: DC | PRN

## 2017-03-05 MED ORDER — KETOROLAC TROMETHAMINE 30 MG/ML IJ SOLN
INTRAMUSCULAR | Status: DC | PRN
Start: 2017-03-05 — End: 2017-03-05
  Administered 2017-03-05: 30 mg via INTRAVENOUS

## 2017-03-05 MED ORDER — FENTANYL CITRATE (PF) 100 MCG/2ML IJ SOLN
INTRAMUSCULAR | Status: AC
  Filled 2017-03-05: qty 4

## 2017-03-05 MED ORDER — LACTATED RINGERS IR SOLN
Status: DC | PRN
  Administered 2017-03-05: 1000 mL

## 2017-03-05 MED ORDER — BUPIVACAINE-EPINEPHRINE 0.5% -1:200000 IJ SOLN
INTRAMUSCULAR | Status: DC | PRN
  Administered 2017-03-05: 10 mL

## 2017-03-05 MED ORDER — LACTATED RINGERS IV SOLN
INTRAVENOUS | Status: DC
  Administered 2017-03-05 (×3): via INTRAVENOUS

## 2017-03-05 MED ORDER — PHENYLEPHRINE 40 MCG/ML (10ML) SYRINGE FOR IV PUSH (FOR BLOOD PRESSURE SUPPORT)
PREFILLED_SYRINGE | INTRAVENOUS | Status: AC
  Filled 2017-03-05: qty 10

## 2017-03-05 MED ORDER — LIDOCAINE 2% (20 MG/ML) 5 ML SYRINGE
INTRAMUSCULAR | Status: DC | PRN
  Administered 2017-03-05: 1.5 mg/kg/h via INTRAVENOUS

## 2017-03-05 MED ORDER — CEFAZOLIN SODIUM-DEXTROSE 2-4 GM/100ML-% IV SOLN
2.0000 g | INTRAVENOUS | Status: AC
  Administered 2017-03-05: 2 g via INTRAVENOUS
  Filled 2017-03-05: qty 100

## 2017-03-05 MED ORDER — SODIUM CHLORIDE 0.9 % IJ SOLN
INTRAMUSCULAR | Status: AC
  Filled 2017-03-05: qty 50

## 2017-03-05 MED ORDER — TRAMADOL HCL 50 MG PO TABS
50.0000 mg | ORAL_TABLET | Freq: Four times a day (QID) | ORAL | Status: DC | PRN
  Administered 2017-03-06: 100 mg via ORAL
  Administered 2017-03-06: 50 mg via ORAL
  Administered 2017-03-06 – 2017-03-07 (×3): 100 mg via ORAL
  Filled 2017-03-05 (×4): qty 2
  Filled 2017-03-05: qty 1

## 2017-03-05 MED ORDER — SCOPOLAMINE 1 MG/3DAYS TD PT72
MEDICATED_PATCH | TRANSDERMAL | Status: AC
  Filled 2017-03-05: qty 1

## 2017-03-05 MED ORDER — MORPHINE SULFATE (PF) 4 MG/ML IV SOLN
2.0000 mg | INTRAVENOUS | Status: DC | PRN
  Administered 2017-03-05: 2 mg via INTRAVENOUS
  Administered 2017-03-06: 4 mg via INTRAVENOUS
  Filled 2017-03-05 (×2): qty 1

## 2017-03-05 MED ORDER — METHYLENE BLUE 0.5 % INJ SOLN
INTRAVENOUS | Status: AC
  Filled 2017-03-05: qty 10

## 2017-03-05 MED ORDER — ACETAMINOPHEN 325 MG PO TABS: 650.0000 mg | ORAL_TABLET | ORAL | Status: DC | PRN

## 2017-03-05 MED ORDER — BUPIVACAINE LIPOSOME 1.3 % IJ SUSP
20.0000 mL | Freq: Once | INTRAMUSCULAR | Status: AC
  Administered 2017-03-05: 20 mL
  Filled 2017-03-05: qty 20

## 2017-03-05 MED ORDER — PROMETHAZINE HCL 25 MG/ML IJ SOLN: 6.2500 mg | INTRAMUSCULAR | Status: DC | PRN

## 2017-03-05 MED ORDER — SUGAMMADEX SODIUM 500 MG/5ML IV SOLN
INTRAVENOUS | Status: DC | PRN
  Administered 2017-03-05: 30 mg via INTRAVENOUS

## 2017-03-05 MED ORDER — DEXAMETHASONE SODIUM PHOSPHATE 10 MG/ML IJ SOLN
INTRAMUSCULAR | Status: DC | PRN
Start: 2017-03-05 — End: 2017-03-05
  Administered 2017-03-05: 10 mg via INTRAVENOUS

## 2017-03-05 SURGICAL SUPPLY — 67 items
ADH SKN CLS APL DERMABOND .7 (GAUZE/BANDAGES/DRESSINGS) ×1
BAG URO CATCHER STRL LF (MISCELLANEOUS) IMPLANT
CATH FOLEY 3WAY  5CC 16FR (CATHETERS) ×2
CATH FOLEY 3WAY 5CC 16FR (CATHETERS) IMPLANT
CATH URET 5FR 28IN OPEN ENDED (CATHETERS) IMPLANT
CHLORAPREP W/TINT 26ML (MISCELLANEOUS) ×3 IMPLANT
CLIP VESOLOCK LG 6/CT PURPLE (CLIP) IMPLANT
CLIP VESOLOCK MED LG 6/CT (CLIP) IMPLANT
COVER SURGICAL LIGHT HANDLE (MISCELLANEOUS) ×3 IMPLANT
COVER TIP SHEARS 8 DVNC (MISCELLANEOUS) ×1 IMPLANT
COVER TIP SHEARS 8MM DA VINCI (MISCELLANEOUS) ×2
DECANTER SPIKE VIAL GLASS SM (MISCELLANEOUS) ×3 IMPLANT
DERMABOND ADVANCED (GAUZE/BANDAGES/DRESSINGS) ×2
DERMABOND ADVANCED .7 DNX12 (GAUZE/BANDAGES/DRESSINGS) IMPLANT
DRAIN CHANNEL 15F RND FF 3/16 (WOUND CARE) ×3 IMPLANT
DRAPE ARM DVNC X/XI (DISPOSABLE) ×4 IMPLANT
DRAPE COLUMN DVNC XI (DISPOSABLE) ×1 IMPLANT
DRAPE DA VINCI XI ARM (DISPOSABLE) ×8
DRAPE DA VINCI XI COLUMN (DISPOSABLE) ×2
DRAPE INCISE IOBAN 66X45 STRL (DRAPES) ×3 IMPLANT
DRAPE LAPAROSCOPIC ABDOMINAL (DRAPES) ×1 IMPLANT
DRAPE SHEET LG 3/4 BI-LAMINATE (DRAPES) ×3 IMPLANT
DRSG TEGADERM 4X4.75 (GAUZE/BANDAGES/DRESSINGS) ×2 IMPLANT
ELECT PENCIL ROCKER SW 15FT (MISCELLANEOUS) ×3 IMPLANT
ELECT REM PT RETURN 15FT ADLT (MISCELLANEOUS) ×3 IMPLANT
EVACUATOR SILICONE 100CC (DRAIN) ×3 IMPLANT
GLOVE BIOGEL M STRL SZ7.5 (GLOVE) ×9 IMPLANT
GOWN STRL REUS W/TWL LRG LVL3 (GOWN DISPOSABLE) ×8 IMPLANT
GUIDEWIRE STR DUAL SENSOR (WIRE) ×2 IMPLANT
IRRIG SUCT STRYKERFLOW 2 WTIP (MISCELLANEOUS)
IRRIGATION SUCT STRKRFLW 2 WTP (MISCELLANEOUS) IMPLANT
KIT BASIN OR (CUSTOM PROCEDURE TRAY) ×3 IMPLANT
NDL INSUFFLATION 14GA 120MM (NEEDLE) ×1 IMPLANT
NEEDLE INSUFFLATION 14GA 120MM (NEEDLE) ×3 IMPLANT
NS IRRIG 1000ML POUR BTL (IV SOLUTION) ×1 IMPLANT
PACK CYSTO (CUSTOM PROCEDURE TRAY) IMPLANT
PAD POSITIONING PINK XL (MISCELLANEOUS) ×2 IMPLANT
PLUG CATH AND CAP STER (CATHETERS) ×2 IMPLANT
PORT ACCESS TROCAR AIRSEAL 12 (TROCAR) IMPLANT
PORT ACCESS TROCAR AIRSEAL 5M (TROCAR) ×2
POSITIONER SURGICAL ARM (MISCELLANEOUS) ×4 IMPLANT
SEAL CANN UNIV 5-8 DVNC XI (MISCELLANEOUS) ×4 IMPLANT
SEAL XI 5MM-8MM UNIVERSAL (MISCELLANEOUS) ×8
SET IRRIG Y TYPE TUR BLADDER L (SET/KITS/TRAYS/PACK) ×2 IMPLANT
SET TRI-LUMEN FLTR TB AIRSEAL (TUBING) ×2 IMPLANT
SHEET LAVH (DRAPES) IMPLANT
SOLUTION ELECTROLUBE (MISCELLANEOUS) ×3 IMPLANT
SPONGE LAP 18X18 X RAY DECT (DISPOSABLE) ×1 IMPLANT
SPONGE LAP 4X18 X RAY DECT (DISPOSABLE) ×3 IMPLANT
STENT URET 6FRX24 CONTOUR (STENTS) ×2 IMPLANT
SUT ETHILON 3 0 PS 1 (SUTURE) ×3 IMPLANT
SUT MNCRL 3 0 VIOLET RB1 (SUTURE) ×1 IMPLANT
SUT MON AB 4-0 SH 27 (SUTURE) ×8 IMPLANT
SUT MONOCRYL 3 0 RB1 (SUTURE)
SUT VIC AB 0 CT1 27 (SUTURE)
SUT VIC AB 0 CT1 27XBRD ANTBC (SUTURE) IMPLANT
SUT VIC AB 0 UR5 27 (SUTURE) IMPLANT
SUT VICRYL 0 UR6 27IN ABS (SUTURE) ×3 IMPLANT
TOWEL OR NON WOVEN STRL DISP B (DISPOSABLE) ×3 IMPLANT
TRAY FOLEY CATH SILVER 14FR (SET/KITS/TRAYS/PACK) ×2 IMPLANT
TRAY FOLEY W/METER SILVER 16FR (SET/KITS/TRAYS/PACK) ×1 IMPLANT
TRAY LAPAROSCOPIC (CUSTOM PROCEDURE TRAY) ×3 IMPLANT
TROCAR BLADELESS OPT 5 100 (ENDOMECHANICALS) ×1 IMPLANT
TROCAR XCEL 12X100 BLDLESS (ENDOMECHANICALS) IMPLANT
TUBING CONNECTING 10 (TUBING) IMPLANT
TUBING CONNECTING 10' (TUBING)
WATER STERILE IRR 1000ML POUR (IV SOLUTION) ×3 IMPLANT

## 2017-03-05 NOTE — Progress Notes (Signed)
MacDiarmid, MD notified regarding pt's JP drain output of 825 cc from 1900-2100. Pts VSS at this time and no c/o increased pain or lightheadedness. Will continue to monitor pt closely. Carnella [place] I

## 2017-03-05 NOTE — Anesthesia Preprocedure Evaluation (Addendum)
Anesthesia Evaluation  Patient identified by MRN, date of birth, ID band Patient awake    Reviewed: Allergy & Precautions, NPO status , Patient's Chart, lab work & pertinent test results  History of Anesthesia Complications (+) PONV and history of anesthetic complications  Airway Mallampati: III  TM Distance: >3 FB Neck ROM: Full    Dental  (+) Teeth Intact, Dental Advisory Given   Pulmonary neg pulmonary ROS,    Pulmonary exam normal breath sounds clear to auscultation       Cardiovascular Exercise Tolerance: Good negative cardio ROS Normal cardiovascular exam Rhythm:Regular Rate:Normal     Neuro/Psych negative neurological ROS  negative psych ROS   GI/Hepatic Neg liver ROS, GERD  Controlled,  Endo/Other  Obesity  Renal/GU Nephrolithiasis     Musculoskeletal  (+) Arthritis ,   Abdominal   Peds  Hematology negative hematology ROS (+)   Anesthesia Other Findings Breast cancer  Reproductive/Obstetrics negative OB ROS                            Anesthesia Physical  Anesthesia Plan  ASA: II  Anesthesia Plan: General   Post-op Pain Management: GA combined w/ Regional for post-op pain   Induction: Intravenous  PONV Risk Score and Plan: 4 or greater and Treatment may vary due to age or medical condition, TIVA, Ondansetron and Midazolam  Airway Management Planned: Oral ETT  Additional Equipment: None  Intra-op Plan:   Post-operative Plan: Extubation in OR  Informed Consent: I have reviewed the patients History and Physical, chart, labs and discussed the procedure including the risks, benefits and alternatives for the proposed anesthesia with the patient or authorized representative who has indicated his/her understanding and acceptance.   Dental advisory given  Plan Discussed with: CRNA  Anesthesia Plan Comments:       Anesthesia Quick Evaluation

## 2017-03-05 NOTE — Op Note (Addendum)
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 left ureteral stent placement  2. left robotic-assisted laparoscopic dismembered pyeloplasty 3. pyeloscopy  Surgeon: Louis Meckel,  M.D.  Assistant(s): Debbrah Alar, PA-C Resident Assistant: Chrisandra Netters, MD  Anesthesia: General  Complications: None  EBL: 100 mL   Specimens: Left UPJ segment  Intraoperative findings: The distal ureter/UPJ was a dynamic with a dilated renal pelvis.  We used a ureteroscope intraoperatively to find the stones in the lower pole noted on the patient's CT scan.  However, despite several attempts to look, we did not encounter the stone.  We did see all the calyces quite clearly.  We did not encounter any stones.  Drains:  1. # 15 Blake perinephric drain 2. 16 Fr Foley catheter  Indication: Susan Morrison is a 47 y.o. patient with a suspected left ureteropelvic junction obstruction.  After a thorough review of the management options for their ureteropelvic junction obstruction, they elected to proceed with surgical treatment and the above procedure.  We have discussed the potential benefits and risks of the procedure, side effects of the proposed treatment, the likelihood of the patient achieving the goals of the procedure, and any potential problems that might occur during the procedure or recuperation. Informed consent has been obtained.  Description of procedure: An assistant was required for this surgical procedure.  The duties of the assistant included but were not limited to suctioning, passing suture, camera manipulation, retraction. This procedure would not be able to be performed without an Environmental consultant.  The patient was taken to the operating room and a general anesthetic was administered. The patient was given preoperative antibiotics, placed in left modified flank position and prepped and  draped in the usual sterile fashion. Next a preoperative timeout was performed.   In the patient's left upper quadrant stab incision was made and the Veress needle inserted.  The abdomen was then insufflated to 15 mmHg.  A site was selected on the left side of the umbilicus for placement of the camera port. This was placed using a standard technique and an 8 mm port was placed. The camera was then used to inspect the abdomen and there was no evidence of any intra-abdominal injuries or other abnormalities. The remaining abdominal ports were then placed. 8 mm robotic ports were placed in the ipsilateral upper quadrant, lower quadrant, and lower `right mid lateral abdominal wall. A 12 mm port was placed in the upper midline for laparoscopic assistance. All ports were placed under direct vision without difficulty. The surgical cart was then docked.   Utilizing the cautery scissors, the white line of Toldt was incised allowing the plane between the mesocolon and the anterior layer of Gerota's fascia to be developed and the kidney exposed.  The ureter and gonadal vein were identified inferiorly and the ureter was lifted anteriorly off the psoas muscle.  Dissection proceeded superiorly along the gonadal vein until the main renal hilum and renal pelvis was identified.  The ureteropelvic junction was isolated from the surrounding structures with a combination of sharp and blunt dissection.    The ureteropelvic junction was divided. The renal pelvis incision was extended inferiorly to allow a more caudle insertion for the ureter.  The ureter and renal pelvis were then examined.  Several attempts were made with the flexible ureteroscope to identify the stones in the lower pole as seen on the CAT scan.  However, looking 2 separate times I was unable to  encounter any stones. Any excess renal pelvis was excised as needed and the renal pelvis and ureter were then spatulated appropriately.  4-0 Monocryl sutures were  then used to reapproximate the renal pelvis and ureter with running sutures along the anterior and posterior wall.  With the anterior wall sutured and the start of the posterior wall we placed our stent. A sensor wire was passed down the ureter and then a 6x24 JJ stent was passed over the wire. At this point we filled the bladder with methylene blue and saline to 350 ml's and had good refulx of fluid into the renal pelvis with a good curl of the stent in the renal pelvis confirming location in the bladder. We then completed the posterior wall. The anastomosis was performed in a tension-free, watertight fashion with the ureter reconnected to the renal pelvis in a position to provide dependent drainage of the renal collecting system.   A # 15 Blake perinephric drain was then brought through the lateral lower abdominal port site and positioned appropriately.  It was secured to the skin with a nylon suture.  The 12 mm port sites were then closed with 0-vicryl sutures placed laparoscopically with the laparoscopic suture passer. All remaining ports were removed under direct vision after hemostasis was confirmed with the pneumoperiotneum let down. All port sites were injected with 0.25% bupivicaine and reapproximated at the skin level with 4-0 monocryl subcuticular sutures. Dermabond was applied to the skin.  The patient appeared to tolerate the procedure well and without complications.  The patient was able to be extubated and transferred to the recovery unit in satisfactory condition.

## 2017-03-05 NOTE — Transfer of Care (Signed)
Immediate Anesthesia Transfer of Care Note  Patient: Susan Morrison  Procedure(s) Performed: XI ROBOTIC ASSISTED PYELOPLASTY (Left )  Patient Location: PACU  Anesthesia Type:General  Level of Consciousness: awake, alert  and oriented  Airway & Oxygen Therapy: Patient Spontanous Breathing and Patient connected to face mask oxygen  Post-op Assessment: Report given to RN and Post -op Vital signs reviewed and stable  Post vital signs: Reviewed and stable  Last Vitals:  Vitals:   03/05/17 0946  BP: 118/79  Pulse: 83  Resp: 16  Temp: 36.9 C  SpO2: 96%    Last Pain:  Vitals:   03/05/17 0946  TempSrc: Oral         Complications: No apparent anesthesia complications

## 2017-03-05 NOTE — Anesthesia Procedure Notes (Signed)
Procedure Name: Intubation Date/Time: 03/05/2017 11:18 AM Performed by: [place] Sharlena Kristensen C, CRNA Pre-anesthesia Checklist: Patient identified, Emergency Drugs available, Suction available and Patient being monitored Patient Re-evaluated:Patient Re-evaluated prior to induction Oxygen Delivery Method: Circle system utilized Preoxygenation: Pre-oxygenation with 100% oxygen Induction Type: IV induction Ventilation: Mask ventilation without difficulty Laryngoscope Size: Mac and 3 Grade View: Grade I Tube type: Oral Tube size: 7.0 mm Number of attempts: 1 Airway Equipment and Method: Stylet and Oral airway Placement Confirmation: ETT inserted through vocal cords under direct vision,  positive ETCO2 and breath sounds checked- equal and bilateral Secured at: 20 cm Tube secured with: Tape Dental Injury: Teeth and Oropharynx as per pre-operative assessment

## 2017-03-05 NOTE — Anesthesia Postprocedure Evaluation (Signed)
Anesthesia Post Note  Patient: Susan Morrison  Procedure(s) Performed: XI ROBOTIC ASSISTED PYELOPLASTY (Left )     Patient location during evaluation: PACU Anesthesia Type: General Level of consciousness: awake and alert Pain management: pain level controlled Vital Signs Assessment: post-procedure vital signs reviewed and stable Respiratory status: spontaneous breathing, nonlabored ventilation and respiratory function stable Cardiovascular status: blood pressure returned to baseline and stable Postop Assessment: no apparent nausea or vomiting Anesthetic complications: no    Last Vitals:  Vitals:   03/05/17 1530 03/05/17 1558  BP: 108/65 125/68  Pulse: 63 (!) 57  Resp: 17 16  Temp: (!) 36.4 C 37.1 C  SpO2: 100% 98%    Last Pain:  Vitals:   03/05/17 1533  TempSrc:   PainSc: [place] E Jalesa Thien

## 2017-03-05 NOTE — H&P (Signed)
UPJ obstruction  HPI: Susan Morrison is a 47 year-old female established patient who is here for further eval and management of UPJ obstruction.  The problem is on the left side. She has not had any treatment for her ureteropelvic junction obstruction.   She does not have a history of urinary infections. She has not had kidney stones. She is not having flank pain. Her renal function is normal.   Her last radiologic test to evaluate the kidneys was 05/16/2016.   4/18: Renogram - Left kidney: T1/2 >89min. Differential function 45%  7/18: Renogram - left kidney: T1/2 fail to fall to half max. Differential function 41%   The patient received neoadjuvant chemotherapy and underwent bilateral vasectomy on 10/24/16. She has subsequently completed all her radiation. She has not had any follow-up imaging, but presumably was treated for cure.   The patient denies any ongoing flank pain, hematuria, or symptoms of renal colic/ureteral obstruction. Her most recent renal function measured in July 2018 demonstrated a normal creatinine. She has not had any follow-up imaging since July which demonstrated a high-grade UPJ junction on the left with worsening differential function compared to her images done 3 months prior (41% versus vd. 45%)     ALLERGIES: None   MEDICATIONS: Ativan  Compazine  Iron  Multiple Vitamin     GU PSH: None     PSH Notes: Port a path put in for chemotherapy in 2018   NON-GU PSH: Bilateral Tubal Ligation - 1996 Breast Biopsy, Right - 04/21/2016, Left - 03/22/2016    GU PMH: UPJ obstruction (congenital) - 08/26/2016, Left, The patient has a high-grade left-sided UPJ obstruction. She is clinically asymptomatic and has normal renal function., - 05/27/2016, Left, She has a left UPJ obstruction with renal atrophy but no symptoms and normal renal function. I am going to get her set up for a lasix renogram and will have her f/u with one of our robotic surgeons for consideration of a  pyeloplasty. Timing will depend on her chemo and breast cancer therapy but it is non-urgent. , - 05/02/2016    NON-GU PMH: Arthritis Breast Cancer, History    FAMILY HISTORY: 2 daughters - No Family History 1 son - No Family History Diabetes - Mother father deceased - Father   SOCIAL HISTORY: Marital Status: Single Preferred Language: English; Ethnicity: Not Hispanic Or Latino; Race: Black or African American Current Smoking Status: Patient has never smoked.   Tobacco Use Assessment Completed: Used Tobacco in last 30 days? Has not drank since 10/06/2015.  Drinks 4+ caffeinated drinks per day.     Notes: Unemployed   REVIEW OF SYSTEMS:    GU Review Female:   Patient reports get up at night to urinate. Patient denies frequent urination, hard to postpone urination, burning /pain with urination, leakage of urine, stream starts and stops, trouble starting your stream, have to strain to urinate, and being pregnant.  Gastrointestinal (Upper):   Patient denies nausea, vomiting, and indigestion/ heartburn.  Gastrointestinal (Lower):   Patient denies diarrhea and constipation.  Constitutional:   Patient reports night sweats and fatigue. Patient denies fever and weight loss.  Skin:   Patient reports skin rash/ lesion. Patient denies itching.  Eyes:   Patient denies blurred vision and double vision.  Ears/ Nose/ Throat:   Patient denies sore throat and sinus problems.  Hematologic/Lymphatic:   Patient denies swollen glands and easy bruising.  Cardiovascular:   Patient denies leg swelling and chest pains.  Respiratory:   Patient  denies cough and shortness of breath.  Endocrine:   Patient denies excessive thirst.  Musculoskeletal:   Patient reports back pain and joint pain.   Neurological:   Patient denies headaches and dizziness.  Psychologic:   Patient denies depression and anxiety.   VITAL SIGNS:      01/15/2017 11:24 AM  Weight 219 lb / 99.34 kg  BP 132/86 mmHg  Pulse 71 /min    MULTI-SYSTEM PHYSICAL EXAMINATION:    Constitutional: Well-nourished. No physical deformities. Normally developed. Good grooming.  Respiratory: No labored breathing, no use of accessory muscles. Clear to auscultation bilaterally  Cardiovascular: Normal temperature, normal extremity pulses, no swelling, no varicosities. Regular rate and rhythm  Gastrointestinal: No scars, soft and nontender     PAST DATA REVIEWED:  Source Of History:  Patient  X-Ray Review: C.T. Abdomen/Pelvis: Reviewed Films.  Lasix Renogram: Reviewed Films.    Notes:                     The patient has most likely an adynamic proximal ureter that is contributing to her chronic obstruction. She still has some renal function on the left kidney with 41% relative function as seen on her most recent nuclear scan.   PROCEDURES:          Urinalysis Dipstick Dipstick Cont'd  Color: Yellow Bilirubin: Neg  Appearance: Clear Ketones: Neg  Specific Gravity: 1.020 Blood: Neg  pH: 6.5 Protein: Trace  Glucose: Neg Urobilinogen: 0.2    Nitrites: Neg    Leukocyte Esterase: Neg    ASSESSMENT:      ICD-10 Details  1 GU:   UPJ obstruction (congenital) - Q62.11    PLAN:           Document Letter(s):  Created for Patient: Clinical Summary         Notes:   The patient has a high-grade UPJ obstruction, but has some preservable or salvageable renal function on her left side. Given her age, I recommended that she consider reconstruction so that we can preserve the remaining function. As such I recommended that she undergo a left-sided robotic-assisted laparoscopic pyeloplasty. I went over the surgery with the patient and described for her the procedure as well as the associated risks and benefits. She understands that following the operation she will have a stent which will be removed in 4 weeks. She also understands that she will be unable to return to work for 4 weeks following the operation. We did discuss the consequence of not  performing the surgery, which would lead to renal atrophy and loss of that kidney. In the future, she may well need all her renal function in the event that she needs ongoing chemotherapy.   While reviewing her images, I would like to perform a left-sided retrograde pyelogram prior to the surgery to ensure that there are no filling defects or worrisome features within the proximal ureter. These have not been well imaged. We can do this simultaneously prior to proceeding with the pyeloplasty.

## 2017-03-06 ENCOUNTER — Encounter (HOSPITAL_COMMUNITY): Payer: Self-pay | Admitting: Urology

## 2017-03-06 LAB — BASIC METABOLIC PANEL
Anion gap: 7 (ref 5–15)
BUN: 11 mg/dL (ref 6–20)
CALCIUM: 9.2 mg/dL (ref 8.9–10.3)
CO2: 23 mmol/L (ref 22–32)
CREATININE: 0.82 mg/dL (ref 0.44–1.00)
Chloride: 104 mmol/L (ref 101–111)
GFR calc Af Amer: >60 mL/min (ref >60)
GLUCOSE: 106 mg/dL — AB (ref 65–99)
Potassium: 4 mmol/L (ref 3.5–5.1)
Sodium: 134 mmol/L — ABNORMAL LOW (ref 135–145)

## 2017-03-06 LAB — HEMOGLOBIN AND HEMATOCRIT, BLOOD
HCT: 37.1 % (ref 36.0–46.0)
Hemoglobin: 12.9 g/dL (ref 12.0–15.0)

## 2017-03-06 LAB — CREATININE, FLUID (PLEURAL, PERITONEAL, JP DRAINAGE): Creat, Fluid: 10.1 mg/dL

## 2017-03-06 NOTE — Progress Notes (Signed)
Urology Progress Note   1 Day Post-Op  Subjective: Doing well. Concerned by high drain output. No nausea with clears. Pain now well controlled with tramadol. Didn't sleep too well due to pain.   Objective: Vital signs in last 24 hours: Temp:  [97.5 F (36.4 C)-98.9 F (37.2 C)] 98.9 F (37.2 C) (01/31 0400) Pulse Rate:  [54-83] 66 (01/31 0400) Resp:  [16-23] 18 (01/31 0400) BP: (106-142)/(65-79) 142/76 (01/31 0400) SpO2:  [96 %-100 %] 97 % (01/31 0400) Weight:  [101 kg (222 lb 9.6 oz)] 101 kg (222 lb 9.6 oz) (01/30 0900)  Intake/Output from previous day: 01/30 0701 - 01/31 0700 In: 3310 [P.O.:500; I.V.:2610; IV Piggyback:200] Out: 4310 [Urine:2525; Drains:1735; Blood:50] Intake/Output this shift: No intake/output data recorded.  Physical Exam:  General: Alert and oriented CV: RRR Lungs: Clear Abdomen: Soft, moderately distended Incisions clean dry and intact with dermabond in place. JP drain with serosanguinous drainage in bulb  GU: Foley with clear yellow urine in tubing  Ext: NT, No erythema  Lab Results: Recent Labs    03/05/17 1517 03/06/17 0440  HGB 13.3 12.9  HCT 38.5 37.1   BMET Recent Labs    03/06/17 0440  NA 134*  K 4.0  CL 104  CO2 23  GLUCOSE 106*  BUN 11  CREATININE 0.82  CALCIUM 9.2     Studies/Results: Dg Abd 1 View  Result Date: 03/05/2017 CLINICAL DATA:  Ureteral stent placement. EXAM: ABDOMEN - 1 VIEW COMPARISON:  CT abdomen pelvis 04/12/2016. FINDINGS: A left double-J ureteral stent is seen with the proximal loop formed in the upper left renal pelvis and the distal tip located in the bladder. A slightly larger bore catheter overlying the left abdomen may represent a percutaneous drain or nephrostomy catheter. Crescentic retroperitoneal air on the left, lateral to the left renal outline, presumably iatrogenic. No unexpected radiopaque calculi. IMPRESSION: Left double-J ureteral stent placement. Electronically Signed   By: Lorin Picket  M.D.   On: 03/05/2017 15:22    Assessment/Plan:  47 y.o. female POD 1 s/p L robotic pyeloplasty.   - aware of high drain output. Has slowed down considerably since midnight. Not concerned at this time, will continue to monitor closely.  - regular diet, med lock - remove foley, TOV - will watch drain output, may send for JP Cr if output continues to be hight  - possible d/c home in PM   LOS: 1 day   FILIPPOU, PAULINE L 03/06/2017, 7:38 AM

## 2017-03-06 NOTE — Plan of Care (Signed)
Patient stable during 7a to 7 p shift, medicated for mild lower abdominal pain x 2 with relief.  Ambulated in hallways multiple times.  Tolerated regular diet well.  Patient voided without difficulty after removal of first foley.  JP drain continues to drain moderate amounts of pink tinged fluid this shift.

## 2017-03-07 LAB — BASIC METABOLIC PANEL
ANION GAP: 5 (ref 5–15)
BUN: 16 mg/dL (ref 6–20)
CO2: 26 mmol/L (ref 22–32)
Calcium: 8.9 mg/dL (ref 8.9–10.3)
Chloride: 106 mmol/L (ref 101–111)
Creatinine, Ser: 0.85 mg/dL (ref 0.44–1.00)
GLUCOSE: 88 mg/dL (ref 65–99)
POTASSIUM: 3.9 mmol/L (ref 3.5–5.1)
Sodium: 137 mmol/L (ref 135–145)

## 2017-03-07 LAB — CREATININE, FLUID (PLEURAL, PERITONEAL, JP DRAINAGE): CREAT FL: 22.2 mg/dL

## 2017-03-07 NOTE — Progress Notes (Signed)
Urology Progress Note   2 Days Post-Op  Subjective: The patient is without complaints this morning. The JP drain was taken off suction, output overnight is been fairly minimal although there has been some leakage from around the drain site.  Foley catheter was reinserted yesterday.  Objective: Vital signs in last 24 hours: Temp:  [97.9 F (36.6 C)-99.1 F (37.3 C)] 98.7 F (37.1 C) (02/01 9937) Pulse Rate:  [50-58] 57 (02/01 0632) Resp:  [18] 18 (02/01 0632) BP: (106-128)/(58-71) 119/64 (02/01 0632) SpO2:  [94 %-100 %] 94 % (02/01 1696)  Intake/Output from previous day: 01/31 0701 - 02/01 0700 In: 240 [P.O.:240] Out: 2390 [Urine:1800; Drains:590] Intake/Output this shift: No intake/output data recorded.  Physical Exam:  General: Alert and oriented CV: RRR Lungs: Clear Abdomen: Soft, moderately distended, mild tenderness to palpation, incisions clean dry and intact with dermabond in place. JP drain with serosanguinous drainage in bulb  GU: Foley with clear yellow urine in tubing  Ext: NT, No erythema  Lab Results: Recent Labs    03/05/17 1517 03/06/17 0440  HGB 13.3 12.9  HCT 38.5 37.1   BMET Recent Labs    03/06/17 0440 03/07/17 0406  NA 134* 137  K 4.0 3.9  CL 104 106  CO2 23 26  GLUCOSE 106* 88  BUN 11 16  CREATININE 0.82 0.85  CALCIUM 9.2 8.9     Studies/Results: Dg Abd 1 View  Result Date: 03/05/2017 CLINICAL DATA:  Ureteral stent placement. EXAM: ABDOMEN - 1 VIEW COMPARISON:  CT abdomen pelvis 04/12/2016. FINDINGS: A left double-J ureteral stent is seen with the proximal loop formed in the upper left renal pelvis and the distal tip located in the bladder. A slightly larger bore catheter overlying the left abdomen may represent a percutaneous drain or nephrostomy catheter. Crescentic retroperitoneal air on the left, lateral to the left renal outline, presumably iatrogenic. No unexpected radiopaque calculi. IMPRESSION: Left double-J ureteral stent  placement. Electronically Signed   By: Lorin Picket M.D.   On: 03/05/2017 15:22    Assessment/Plan:  47 y.o. female POD 2 s/p L robotic pyeloplasty.   JP creatinine is pending today. We will discontinue the Foley catheter. We will plan to discharge the patient this afternoon, if she does not fact have a persistent leak, we will send her home with her JP drain.   LOS: 2 days   Ardis Hughs 03/07/2017, 7:45 AM

## 2017-03-07 NOTE — Discharge Instructions (Signed)
JP drain instructions:  Keep the drain off suction.    Keep track of the output on a daily basis by writing it down.  When the output is less than 10cc/day for two days in a row, contact Dr. Carlton Adam office so that the drain can be removed.  Strip the drain daily, done by pinching the tubing close to the skin and while holding it in place running your fingers down the tubing towards the bulb and evacuating what's in the tubing.  The stitch holding it in place should remain there.   Post-op Instructions: 1.  Activity:  You are encouraged to ambulate frequently (about every hour during waking hours) to help prevent blood clots from forming in your legs or lungs.  However, you should not engage in any heavy lifting (> 10-15 lbs), strenuous activity, or straining. 2. Diet: You should advance your diet as instructed by your physician.  It will be normal to have some bloating, nausea, and abdominal discomfort intermittently. 3. Prescriptions:  You will be provided a prescription for pain medication to take as needed.  If your pain is not severe enough to require the prescription pain medication, you may take extra strength Tylenol instead which will have less side effects.  You should also take a prescribed stool softener to avoid straining with bowel movements as the prescription pain medication may constipate you. 4. Incisions: You may remove your dressing bandages 48 hours after surgery if not removed in the hospital.  You will either have some small staples or special tissue glue at each of the incision sites. Once the bandages are removed (if present), the incisions may stay open to air.  You may start showering (but not soaking or bathing in water) the 2nd day after surgery and the incisions simply need to be patted dry after the shower.  No additional care is needed. 5. What to call us about: You should call the office [phone]) if you develop fever > 101 or develop persistent  vomiting.  You may resume aspirin, advil, aleve, mobic, vitamins, and supplements 7 days after surgery.

## 2017-03-07 NOTE — Care Management Note (Signed)
Case Management Note  Patient Details  Name: Susan Morrison MRN: [id number] Date of Birth: 15-Feb-1970  Subjective/Objective:   UPJ Obstruction                 Action/Plan: Plan to discharge home with [place]   Expected Discharge Date:  03/07/17               Expected Discharge Plan:  [place]  In-House Referral:     Discharge planning Services  CM Consult  Post Acute Care Choice:    Choice offered to:  Patient  DME Arranged:    DME Agency:     HH Arranged:  RN [employer] Agency:  [facility]  Status of Service:  In process, will continue to follow  If discussed at Long Length of Stay Meetings, dates discussed:    Additional CommentsPurcell Mouton, RN 03/07/2017, 11:41 AM

## 2017-03-07 NOTE — Progress Notes (Signed)
JP site leaking overnight.  Dressing had to be reinforced, then completely changed times one after that. Will continue to monitor patient.

## 2017-03-07 NOTE — Discharge Summary (Signed)
Date of admission: 03/05/2017  Date of discharge: 03/07/2017  Admission diagnosis: left UPJ obstruction  Discharge diagnosis: same  Secondary diagnoses:  Patient Active Problem List   Diagnosis Date Noted  . Port catheter in place 06/11/2016  . Genetic testing 06/03/2016  . Keratoconjunctivitis of both eyes 06/03/2016  . UPJ obstruction, congenital 05/22/2016  . Iron deficiency anemia due to chronic blood loss 04/03/2016  . Malignant neoplasm of lower-inner quadrant of left breast in female, estrogen receptor negative ([place]) 03/26/2016  . Menorrhagia with regular cycle 03/05/2016  . Anemia 03/05/2016  . Left breast mass 03/05/2016  . Elevated liver enzymes 03/05/2016    Procedures performed: Procedure(s): XI ROBOTIC ASSISTED PYELOPLASTY  History and Physical: For full details, please see admission history and physical. Briefly, Susan Morrison is a 47 y.o. year old patient with left UPJ obstruction.   Hospital Course: Patient tolerated the procedure well.  She was then transferred to the floor after an uneventful PACU stay.  Her hospital course was uncomplicated.  On POD#2 she had met discharge criteria: was eating a regular diet, was up and ambulating independently,  pain was well controlled, and was voiding without a catheter.  JP drain demonstrated a large amount of fluid initially and then tapered off substantially.  However the Creatinine from the drain confirmed a leak at the anastamosis.  The drain was taken off suction and the patient was discharged home with the drain.  She will contact us when the drain has put out less than 10cc in 24 hours twice in a row so that we can remove the drain.  Laboratory values:  Recent Labs    03/05/17 1517 03/06/17 0440  HGB 13.3 12.9  HCT 38.5 37.1   Recent Labs    03/06/17 0440 03/07/17 0406  NA 134* 137  K 4.0 3.9  CL 104 106  CO2 23 26  GLUCOSE 106* 88  BUN 11 16  CREATININE 0.82 0.85  CALCIUM 9.2 8.9   No results for  input(s): LABPT, INR in the last 72 hours. No results for input(s): LABURIN in the last 72 hours. Results for orders placed or performed during the hospital encounter of 02/28/17  Urine culture     Status: None   Collection Time: 02/28/17  9:49 AM  Result Value Ref Range Status   Specimen Description URINE, CLEAN CATCH  Final   Special Requests NONE  Final   Culture   Final    NO GROWTH Performed at [facility], [place] [address] [place], [place] [address]    Report Status 03/01/2017 FINAL  Final    Disposition: Home  Discharge instruction: The patient was instructed to be ambulatory but told to refrain from heavy lifting, strenuous activity, or driving.   Discharge medications:  Allergies as of 03/07/2017   No Known Allergies     Medication List    STOP taking these medications   meloxicam 15 MG tablet Commonly known as:  MOBIC   WOMENS MULTI VITAMIN & MINERAL Tabs     TAKE these medications   acetaminophen 500 MG tablet Commonly known as:  TYLENOL Take 1,000 mg by mouth every 6 (six) hours as needed (for pain.).   diclofenac sodium 1 % Gel Commonly known as:  VOLTAREN Apply 2 g topically 2 times daily at 12 noon and 4 pm. What changed:    when to take this  reasons to take this   ferrous sulfate 325 (65 FE) MG EC tablet Take  325 mg by mouth every other day. In the morning   phentermine 15 MG capsule Take 1 capsule (15 mg total) by mouth every morning. What changed:  when to take this   traMADol 50 MG tablet Commonly known as:  ULTRAM Take 1-2 tablets (50-100 mg total) by mouth every 6 (six) hours as needed for moderate pain or severe pain.       Followup:  Follow-up Information    Ardis Hughs, MD Follow up on 04/03/2017.   Specialty:  Urology Why:  at 12:45 Contact information: [place] [id number] [phone]

## 2017-03-07 NOTE — Progress Notes (Signed)
Patient educated on how to care for and empty JP drain. Emphasized to let MD know when drainage is less than 10cc/day. All questions answered.

## 2017-04-02 ENCOUNTER — Ambulatory Visit: Payer: Medicaid Other | Admitting: Nurse Practitioner

## 2017-05-01 ENCOUNTER — Telehealth: Payer: Self-pay | Admitting: Hematology and Oncology

## 2017-05-01 ENCOUNTER — Inpatient Hospital Stay: Payer: Medicaid Other | Attending: Hematology and Oncology | Admitting: Hematology and Oncology

## 2017-05-01 DIAGNOSIS — C50312 Malignant neoplasm of lower-inner quadrant of left female breast: Secondary | ICD-10-CM

## 2017-05-01 DIAGNOSIS — Z79899 Other long term (current) drug therapy: Secondary | ICD-10-CM | POA: Diagnosis not present

## 2017-05-01 DIAGNOSIS — N1330 Unspecified hydronephrosis: Secondary | ICD-10-CM | POA: Insufficient documentation

## 2017-05-01 DIAGNOSIS — Z9221 Personal history of antineoplastic chemotherapy: Secondary | ICD-10-CM | POA: Insufficient documentation

## 2017-05-01 DIAGNOSIS — Z923 Personal history of irradiation: Secondary | ICD-10-CM | POA: Insufficient documentation

## 2017-05-01 DIAGNOSIS — Z171 Estrogen receptor negative status [ER-]: Secondary | ICD-10-CM

## 2017-05-01 DIAGNOSIS — Z853 Personal history of malignant neoplasm of breast: Secondary | ICD-10-CM | POA: Insufficient documentation

## 2017-05-01 NOTE — Telephone Encounter (Signed)
Gave patient AVs and calendar of upcoming September appointments.  °

## 2017-05-01 NOTE — Progress Notes (Signed)
Patient Care Team: Nche, Charlene Brooke, NP as PCP - General (Internal Medicine) Alphonsa Overall, MD as Consulting Physician (General Surgery) Nicholas Lose, MD as Consulting Physician (Hematology and Oncology) Eppie Gibson, MD as Attending Physician (Radiation Oncology) Gardenia Phlegm, NP as Nurse Practitioner (Hematology and Oncology)  DIAGNOSIS:  Encounter Diagnosis  Name Primary?  . Malignant neoplasm of lower-inner quadrant of left breast in female, estrogen receptor negative ([place])     SUMMARY OF ONCOLOGIC HISTORY:   Malignant neoplasm of lower-inner quadrant of left breast in female, estrogen receptor negative ([place])   03/22/2016 Initial Diagnosis    Left breast biopsy 8:00: IDC high-grade with necrosis, left axillary lymph node biopsy IDC, ER 0%, PR 0%, HER-2 negative ratio 1.42, Ki-67 90%; palpable lump 3.5 x 2.6 x 2.2 cm and 2.8 Center left axillary lymph node, T2 N1 stage IIB      04/10/2016 Initial Biopsy    Additional biopsies left breast 9:30: Sclerosed intraductal papilloma with every 3 hours, right breast 1:00: Complex sclerosing lesion with UDH, right breast 8:00: Complex sclerosing lesion with UDH      04/17/2016 - 08/28/2016 Neo-Adjuvant Chemotherapy    Dose dense Adriamycin and Cytoxan 4 followed by Taxol weekly 12      05/15/2016 Genetic Testing    Genetic counseling and testing for hereditary cancer syndromes performed on 05/15/2016. Results are negative for pathogenic mutations in 46 genes analyzed by [facility]'s Common Hereditary Cancers Panel. Results are dated 05/24/2016. Genes tested: APC, ATM, AXIN2, BARD1, BMPR1A, BRCA1, BRCA2, BRIP1, CDH1, CDKN2A, CHEK2, CTNNA1, DICER1, EPCAM, GREM1, HOXB13, KIT, MEN1, MLH1, MSH2, MSH3, MSH6, MUTYH, NBN, NF1, NTHL1, PALB2, PDGFRA, PMS2, POLD1, POLE, PTEN, RAD50, RAD51C, RAD51D, SDHA, SDHB, SDHC, SDHD, SMAD4, SMARCA4, STK11, TP53, TSC1, TSC2, and VHL.  A variant of uncertain significance (not clinically actionable) was  noted in MUTYH.         10/24/2016 Surgery    Right lumpectomy: No malignancy identified. Complete pathologic response. 0/4 lymph nodes negative, previously triple negative disease with a Ki-67 of 90%      12/04/2016 - 01/14/2017 Radiation Therapy    Adjuvant radiation therapy       CHIEF COMPLIANT: Follow-up to assess any residual toxicities from prior chemotherapy  INTERVAL HISTORY: Susan Morrison is a 47 year old with above-mentioned history of triple negative right breast cancer treated with neoadjuvant chemotherapy followed by lumpectomy and radiation.  She is here for a follow-up of her chemotherapy related side effects. 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  She did very well from that procedure.  REVIEW OF SYSTEMS:   Constitutional: Denies fevers, chills or abnormal weight loss Eyes: Denies blurriness of vision Ears, nose, mouth, throat, and face: Denies mucositis or sore throat Respiratory: Denies cough, dyspnea or wheezes Cardiovascular: Denies palpitation, chest discomfort Gastrointestinal:  Denies nausea, heartburn or change in bowel habits Skin: Denies abnormal skin rashes Lymphatics: Denies new lymphadenopathy or easy bruising Neurological:Neuropathy appears to have resolved Behavioral/Psych: Mood is stable, no new changes  Extremities: No lower extremity edema Breast:  denies any pain or lumps or nodules in either breasts All other systems were reviewed with the patient and are negative.  I have reviewed the past medical history, past surgical history, social history and family history with the patient and they are unchanged from previous note.  ALLERGIES:  has No Known Allergies.  MEDICATIONS:  Current Outpatient Medications  Medication Sig Dispense Refill  . acetaminophen (TYLENOL) 500 MG tablet Take 1,000  mg by mouth every 6 (six) hours as needed (for pain.).     Marland Kitchen diclofenac sodium (VOLTAREN) 1 % GEL Apply 2 g topically  2 times daily at 12 noon and 4 pm. (Patient taking differently: Apply 2 g topically 2 (two) times daily as needed (for pain.). ) 100 g 0  . ferrous sulfate 325 (65 FE) MG EC tablet Take 325 mg by mouth every other day. In the morning    . phentermine 15 MG capsule Take 1 capsule (15 mg total) by mouth every morning. (Patient taking differently: Take 15 mg by mouth daily with breakfast. ) 30 capsule 0  . traMADol (ULTRAM) 50 MG tablet Take 1-2 tablets (50-100 mg total) by mouth every 6 (six) hours as needed for moderate pain or severe pain. 30 tablet 0   No current facility-administered medications for this visit.     PHYSICAL EXAMINATION: ECOG PERFORMANCE STATUS: 1 - Symptomatic but completely ambulatory  Vitals:   05/01/17 0857  BP: 135/83  Pulse: (!) 59  Resp: (!) 60  Temp: 98 F (36.7 C)  SpO2: 100%   Filed Weights   05/01/17 0857  Weight: 219 lb 6.4 oz (99.5 kg)    GENERAL:alert, no distress and comfortable SKIN: skin color, texture, turgor are normal, no rashes or significant lesions EYES: normal, Conjunctiva are pink and non-injected, sclera clear OROPHARYNX:no exudate, no erythema and lips, buccal mucosa, and tongue normal  NECK: supple, thyroid normal size, non-tender, without nodularity LYMPH:  no palpable lymphadenopathy in the cervical, axillary or inguinal LUNGS: clear to auscultation and percussion with normal breathing effort HEART: regular rate & rhythm and no murmurs and no lower extremity edema ABDOMEN:abdomen soft, non-tender and normal bowel sounds MUSCULOSKELETAL:no cyanosis of digits and no clubbing  NEURO: alert & oriented x 3 with fluent speech, no focal motor/sensory deficits EXTREMITIES: No lower extremity edema  LABORATORY DATA:  I have reviewed the data as listed CMP Latest Ref Rng & Units 03/07/2017 03/06/2017 02/28/2017  Glucose 65 - 99 mg/dL 88 106(H) 90  BUN 6 - 20 mg/dL 16 11 13   Creatinine 0.44 - 1.00 mg/dL 0.85 0.82 0.59  Sodium 135 - 145  mmol/L 137 134(L) 140  Potassium 3.5 - 5.1 mmol/L 3.9 4.0 4.3  Chloride 101 - 111 mmol/L 106 104 106  CO2 22 - 32 mmol/L 26 23 27   Calcium 8.9 - 10.3 mg/dL 8.9 9.2 9.4  Total Protein 6.4 - 8.3 g/dL - - -  Total Bilirubin 0.20 - 1.20 mg/dL - - -  Alkaline Phos 40 - 150 U/L - - -  AST 5 - 34 U/L - - -  ALT 0 - 55 U/L - - -    Lab Results  Component Value Date   WBC 6.0 02/28/2017   HGB 12.9 03/06/2017   HCT 37.1 03/06/2017   MCV 70.8 (L) 02/28/2017   PLT 170 02/28/2017   NEUTROABS 2.5 08/28/2016    ASSESSMENT & PLAN:  Malignant neoplasm of lower-inner quadrant of left breast in female, estrogen receptor negative ([place]) Treatment summary: 1. 03/22/2016: Left breast biopsy 8:00: IDC high-grade with necrosis, left axillary lymph node biopsy IDC, ER 0%, PR 0%, HER-2 negative ratio 1.42, Ki-67 90%; palpable lump 3.5 x 2.6 x 2.2 cm and 2.8 Center left axillary lymph node, T2 N1 stage IIB 2. Neoadjuvant chemotherapy with Adriamycin and Cytoxan dose dense 4 followed by Taxolweekly 12  completed 08/30/2016 2. 10/24/2016 Right lumpectomy: No malignancy identified. Complete pathologic response. 0/4 lymph nodes  negative, previously triple negative disease with a Ki-67 of 90% 3. Adjuvant radiation therapy 12/04/2016-01/14/2017  Chemotherapy toxicities: 1. 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 Chemotherapy-induced anemia: Resolved  Left hydronephrosis with chronic ureteropelvic obstruction follows with urology: February 2019 laparoscopic pyeloplasty  Surveillance: Breast exam 05/01/2017: Benign Mammograms will need to be ordered  Return to clinic in 6 months for follow-up with survivorship and after that we can see her once a year  RTC 6 months for surveillance  No orders of the defined types were placed in this encounter.  The patient has a good understanding of the overall plan. she agrees with it. she will call with any problems that may develop before  the next visit here.   Harriette Ohara, MD 05/01/17

## 2017-05-01 NOTE — Assessment & Plan Note (Signed)
Treatment summary: 1. 03/22/2016: Left breast biopsy 8:00: IDC high-grade with necrosis, left axillary lymph node biopsy IDC, ER 0%, PR 0%, HER-2 negative ratio 1.42, Ki-67 90%; palpable lump 3.5 x 2.6 x 2.2 cm and 2.8 Center left axillary lymph node, T2 N1 stage IIB 2. Neoadjuvant chemotherapy with Adriamycin and Cytoxan dose dense 4 followed by Taxolweekly 12  completed 08/30/2016 2. 10/24/2016 Right lumpectomy: No malignancy identified. Complete pathologic response. 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 Adjuvant radiation therapy 12/04/2016-01/14/2017  Chemotherapy toxicities: 1. Chemotherapy-induced peripheral neuropathy:  Slowly improving  2. Chemotherapy-induced anemia  Left hydronephrosis with chronic ureteropelvic obstruction follows with urology: February 2019 laparoscopic pyeloplasty  Surveillance: Breast exam 05/01/2017: Benign Mammograms will need to be ordered  Return to clinic in 6 months for follow-up with survivorship and after that we can see her once a year  RTC 6 months for surveillance

## 2017-05-09 ENCOUNTER — Ambulatory Visit
Admission: RE | Admit: 2017-05-09 | Discharge: 2017-05-09 | Disposition: A | Discharge status: Home / Self Care | Payer: Medicaid Other | Source: Ambulatory Visit | Attending: Hematology and Oncology | Admitting: Hematology and Oncology

## 2017-05-09 DIAGNOSIS — Z171 Estrogen receptor negative status [ER-]: Principal | ICD-10-CM

## 2017-05-09 DIAGNOSIS — C50312 Malignant neoplasm of lower-inner quadrant of left female breast: Secondary | ICD-10-CM

## 2017-05-09 HISTORY — DX: Personal history of irradiation: Z92.3

## 2017-05-09 HISTORY — DX: Personal history of antineoplastic chemotherapy: Z92.21

## 2017-07-22 ENCOUNTER — Telehealth: Payer: Self-pay

## 2017-07-22 ENCOUNTER — Other Ambulatory Visit: Payer: Self-pay

## 2017-07-22 MED ORDER — GABAPENTIN 100 MG PO CAPS: 100.0000 mg | ORAL_CAPSULE | Freq: Every day | ORAL | 0 refills | Status: DC

## 2017-07-22 MED ORDER — GABAPENTIN 100 MG PO CAPS: ORAL_CAPSULE | ORAL | 0 refills | Status: DC

## 2017-07-22 NOTE — Telephone Encounter (Signed)
Opened in error

## 2017-07-22 NOTE — Progress Notes (Signed)
Called pt to address concerns about her leg and ankle pain. Pt states that she recently went back to work 22hrs per week and 5-8hrs per day on the job. Pt is mostly on her feet when she's at work. She noticed that her legs swells up easily after 2 hrs of being on her feet, as well as feet pain, radiating up her leg, x4 weeks now. Pt reports pain relief with foot soaks and leg/feet elevation after her shift is over. She takes tylenol to take the edge of her pain but still has severe pain from her feet.   Advised that pt should discuss this with her boss and may need to take more frequent breaks to help with managing her leg swelling and neuropathy. Obtained order from [facility] to start pt on low dose gabapentin daily. Told pt to call within 2-3 weeks to report how pain is doing with gabapentin in place. Will send escript to [facility] pharmacy. Pt has no further questions at this time.

## 2017-08-06 ENCOUNTER — Ambulatory Visit: Payer: Medicaid Other | Admitting: Nurse Practitioner

## 2017-08-06 ENCOUNTER — Encounter: Payer: Self-pay | Admitting: Nurse Practitioner

## 2017-08-06 VITALS — BP 126/86 | HR 68 | Temp 98.3°F | Ht 66.5 in | Wt 226.0 lb

## 2017-08-06 DIAGNOSIS — D500 Iron deficiency anemia secondary to blood loss (chronic): Secondary | ICD-10-CM

## 2017-08-06 DIAGNOSIS — G629 Polyneuropathy, unspecified: Secondary | ICD-10-CM | POA: Diagnosis not present

## 2017-08-06 MED ORDER — GABAPENTIN 300 MG PO CAPS: 300.0000 mg | ORAL_CAPSULE | Freq: Two times a day (BID) | ORAL | 0 refills | Status: DC

## 2017-08-06 MED ORDER — DICLOFENAC SODIUM 1 % TD GEL: 2.0000 g | Freq: Two times a day (BID) | TRANSDERMAL | 1 refills | Status: DC

## 2017-08-06 NOTE — Patient Instructions (Signed)
Go to lab for blood draw. Will refer to neurology if labs are normal. I have changed gabapentin to 300mg  every 12hrs.   Neuropathic Pain Neuropathic pain is pain caused by damage to the nerves that are responsible for certain sensations in your body (sensory nerves). The pain can be caused by damage to:  The sensory nerves that send signals to your spinal cord and brain (peripheral nervous system).  The sensory nerves in your brain or spinal cord (central nervous system).  Neuropathic pain can make you more sensitive to pain. What would be a minor sensation for most people may feel very painful if you have neuropathic pain. This is usually a long-term condition that can be difficult to treat. The type of pain can differ from person to person. It may start suddenly (acute), or it may develop slowly and last for a long time (chronic). Neuropathic pain may come and go as damaged nerves heal or may stay at the same level for years. It often causes emotional distress, loss of sleep, and a lower quality of life. What are the causes? The most common cause of damage to a sensory nerve is diabetes. Many other diseases and conditions can also cause neuropathic pain. Causes of neuropathic pain can be classified as:  Toxic. Many drugs and chemicals can cause toxic damage. The most common cause of toxic neuropathic pain is damage from drug treatment for cancer (chemotherapy).  Metabolic. This type of pain can happen when a disease causes imbalances that damage nerves. Diabetes is the most common of these diseases. Vitamin B deficiency caused by long-term alcohol abuse is another common cause.  Traumatic. Any injury that cuts, crushes, or stretches a nerve can cause damage and pain. A common example is feeling pain after losing an arm or leg (phantom limb pain).  Compression-related. If a sensory nerve gets trapped or compressed for a long period of time, the blood supply to the nerve can be cut  off.  Vascular. Many blood vessel diseases can cause neuropathic pain by decreasing blood supply and oxygen to nerves.  Autoimmune. This type of pain results from diseases in which the body's defense system mistakenly attacks sensory nerves. Examples of autoimmune diseases that can cause neuropathic pain include lupus and multiple sclerosis.  Infectious. Many types of viral infections can damage sensory nerves and cause pain. Shingles infection is a common cause of this type of pain.  Inherited. Neuropathic pain can be a symptom of many diseases that are passed down through families (genetic).  What are the signs or symptoms? The main symptom is pain. Neuropathic pain is often described as:  Burning.  Shock-like.  Stinging.  Hot or cold.  Itching.  How is this diagnosed? No single test can diagnose neuropathic pain. Your health care provider will do a physical exam and ask you about your pain. You may use a pain scale to describe how bad your pain is. You may also have tests to see if you have a high sensitivity to pain and to help find the cause and location of any sensory nerve damage. These tests may include:  Imaging studies, such as: ? X-rays. ? CT scan. ? MRI.  Nerve conduction studies to test how well nerve signals travel through your sensory nerves (electrodiagnostic testing).  Stimulating your sensory nerves through electrodes on your skin and measuring the response in your spinal cord and brain (somatosensory evoked potentials).  How is this treated? Treatment for neuropathic pain may change over time. You  may need to try different treatment options or a combination of treatments. Some options include:  Over-the-counter pain relievers.  Prescription medicines. Some medicines used to treat other conditions may also help neuropathic pain. These include medicines to: ? Control seizures (anticonvulsants). ? Relieve depression (antidepressants).  Prescription-strength  pain relievers (narcotics). These are usually used when other pain relievers do not help.  Transcutaneous nerve stimulation (TENS). This uses electrical currents to block painful nerve signals. The treatment is painless.  Topical and local anesthetics. These are medicines that numb the nerves. They can be injected as a nerve block or applied to the skin.  Alternative treatments, such as: ? Acupuncture. ? Meditation. ? Massage. ? Physical therapy. ? Pain management programs. ? Counseling.  Follow these instructions at home:  Learn as much as you can about your condition.  Take medicines only as directed by your health care provider.  Work closely with all your health care providers to find what works best for you.  Have a good support system at home.  Consider joining a chronic pain support group. Contact a health care provider if:  Your pain treatments are not helping.  You are having side effects from your medicines.  You are struggling with fatigue, mood changes, depression, or anxiety. This information is not intended to replace advice given to you by your health care provider. Make sure you discuss any questions you have with your health care provider. Document Released: 10/19/2003 Document Revised: 08/11/2015 Document Reviewed: 07/01/2013 Elsevier Interactive Patient Education  [employer].

## 2017-08-06 NOTE — Progress Notes (Signed)
Subjective:  Patient ID: Susan Morrison, female    DOB: November 16, 1970  Age: 47 y.o. MRN: [id number]  CC: Pain (pain from big toe-ankle and legs/cancer doc gave her gabapentin--didnt help/might be from chemo med causing this ? started in 05/2017) and Form Completion (disability process consult due to leg pain--working 15 hrs right now--cant stand longer than 5 hrs. )  Leg Pain   Incident onset: onset 05/2017. There was no injury mechanism. Pain location: bilateral knee to toes. The quality of the pain is described as burning. The pain is at a severity of 10/10. The pain is severe. The pain has been intermittent since onset. Associated symptoms include numbness and tingling. Associated symptoms comments: Burning sensation. The symptoms are aggravated by weight bearing (standing more than 3hrs). She has tried NSAIDs (gabapentin) for the symptoms. The treatment provided mild relief.   Started gabapentin 100mg  07/24/2017, titrated to 1cap in AM and 2caps in PM. Also tried topical voltaren gel. Mild improvement. Denies any adverse effects with medication. Describes as burning and swelling, onset after standing for 3hrs,  Triggered by prolonged standing for more than 3hrs. Unable to maintain current job, has cut hours to 15hrs per week. Getting worse. Unable to tolerate compression stocking.  She completed chemotherapy 08/30/2016, adjuvant radiation completed 01/14/2017. She experienced peripheral neuropathy during chemotherapy, but resolved upon completion per patient.   Reviewed [place] today   Outpatient Medications Prior to Visit  Medication Sig Dispense Refill  . gabapentin (NEURONTIN) 100 MG capsule Start with 1 tablet once a day for 1 week. Increase to 2x a day the 2nd week. Increase to 3x a day third week and continue with this dose. 90 capsule 0  . acetaminophen (TYLENOL) 500 MG tablet Take 1,000 mg by mouth every 6 (six) hours as needed (for pain.).     Marland Kitchen ferrous sulfate 325 (65 FE) MG EC  tablet Take 325 mg by mouth every other day. In the morning    . diclofenac sodium (VOLTAREN) 1 % GEL Apply 2 g topically 2 times daily at 12 noon and 4 pm. (Patient not taking: Reported on 08/06/2017) 100 g 0  . phentermine 15 MG capsule Take 1 capsule (15 mg total) by mouth every morning. (Patient not taking: Reported on 08/06/2017) 30 capsule 0  . traMADol (ULTRAM) 50 MG tablet Take 1-2 tablets (50-100 mg total) by mouth every 6 (six) hours as needed for moderate pain or severe pain. (Patient not taking: Reported on 08/06/2017) 30 tablet 0   No facility-administered medications prior to visit.     ROS See HPI  Objective:  BP 126/86   Pulse 68   Temp 98.3 F (36.8 C) (Oral)   Ht 5' 6.5" (1.689 m)   Wt 226 lb (102.5 kg)   SpO2 98%   BMI 35.93 kg/m   BP Readings from Last 3 Encounters:  08/06/17 126/86  05/01/17 135/83  03/07/17 119/64    Wt Readings from Last 3 Encounters:  08/06/17 226 lb (102.5 kg)  05/01/17 219 lb 6.4 oz (99.5 kg)  03/05/17 222 lb 9.6 oz (101 kg)   Physical Exam  Constitutional: She is oriented to person, place, and time. No distress.  Cardiovascular: Intact distal pulses.  Pulses:      Dorsalis pedis pulses are 2+ on the right side, and 2+ on the left side.       Posterior tibial pulses are 2+ on the right side, and 2+ on the left side.  Pulmonary/Chest: Effort normal.  Musculoskeletal: She exhibits edema. She exhibits no tenderness or deformity.       Right foot: There is normal range of motion and no deformity.       Left foot: There is normal range of motion and no deformity.  Feet:  Right Foot:  Skin Integrity: Negative for skin breakdown, erythema, warmth or dry skin.  Left Foot:  Skin Integrity: Negative for skin breakdown, erythema, warmth or dry skin.  Neurological: She is alert and oriented to person, place, and time. She has normal strength. She displays a negative Romberg sign.  Reflex Scores:      Patellar reflexes are 2+ on the right side  and 2+ on the left side. Normal and equal microfilament and vibratory sensation  Skin: Skin is warm and dry. No rash noted.  Psychiatric: She has a normal mood and affect. Her behavior is normal. Thought content normal.  Vitals reviewed.   Lab Results  Component Value Date   WBC 6.0 02/28/2017   HGB 12.9 03/06/2017   HCT 37.1 03/06/2017   PLT 170 02/28/2017   GLUCOSE 88 03/07/2017   CHOL 101 03/05/2016   TRIG 101.0 03/05/2016   HDL 37.60 (L) 03/05/2016   LDLCALC 43 03/05/2016   ALT 40 08/28/2016   AST 31 08/28/2016   NA 137 03/07/2017   K 3.9 03/07/2017   CL 106 03/07/2017   CREATININE 0.85 03/07/2017   BUN 16 03/07/2017   CO2 26 03/07/2017   TSH 3.08 12/18/2016   HGBA1C 5.3 12/18/2016    Mm Diag Breast Tomo Bilateral  Result Date: 05/09/2017 CLINICAL DATA:  47 year old female for annual bilateral mammograms. History of LEFT breast cancer and lumpectomy in 2018. History of bilateral papillomas and RIGHT complex sclerosing lesion with breast excisions. EXAM: DIGITAL DIAGNOSTIC BILATERAL MAMMOGRAM WITH CAD AND TOMO COMPARISON:  Previous exam(s). ACR Breast Density Category c: The breast tissue is heterogeneously dense, which may obscure small masses. FINDINGS: 2D and 3D full field views of both breasts and a magnification view of the lumpectomy site demonstrate no suspicious mass, nonsurgical distortion or worrisome calcifications. Surgical changes within both breasts identified. Mammographic images were processed with CAD. IMPRESSION: No mammographic evidence of breast malignancy. Bilateral breast surgical changes. RECOMMENDATION: Bilateral diagnostic mammograms in 1 year. Consider annual/biennial bilateral breast MRI given that the LEFT breast cancer was barely visible mammographically. I have discussed the findings and recommendations with the patient. Results were also provided in writing at the conclusion of the visit. If applicable, a reminder letter will be sent to the patient  regarding the next appointment. BI-RADS CATEGORY  2: Benign. Electronically Signed   By: Margarette Canada M.D.   On: 05/09/2017 12:25    Assessment & Plan:   Susan Morrison was seen today for pain and form completion.  Diagnoses and all orders for this visit:  Peripheral polyneuropathy -     Hemoglobin A1c -     TSH -     CBC -     B12 and Folate Panel -     diclofenac sodium (VOLTAREN) 1 % GEL; Apply 2 g topically 2 times daily at 12 noon and 4 pm. -     Comprehensive metabolic panel -     gabapentin (NEURONTIN) 300 MG capsule; Take 1 capsule (300 mg total) by mouth 2 (two) times daily.  Iron deficiency anemia due to chronic blood loss -     CBC -     IBC panel   I have discontinued Morley Kos.  Gangemi's phentermine and traMADol. I have also changed her gabapentin. Additionally, I am having her maintain her acetaminophen, ferrous sulfate, and diclofenac sodium.  Meds ordered this encounter  Medications  . diclofenac sodium (VOLTAREN) 1 % GEL    Sig: Apply 2 g topically 2 times daily at 12 noon and 4 pm.    Dispense:  100 g    Refill:  1    Order Specific Question:   Supervising Provider    Answer:   Lucille Passy [[id number]]  . gabapentin (NEURONTIN) 300 MG capsule    Sig: Take 1 capsule (300 mg total) by mouth 2 (two) times daily.    Dispense:  60 capsule    Refill:  0    Order Specific Question:   Supervising Provider    Answer:   Lucille Passy [[id number]]   Follow-up: Return in about 1 month (around 09/03/2017) for neuropathy.Wilfred Lacy, NP

## 2017-08-08 ENCOUNTER — Telehealth: Payer: Self-pay | Admitting: Nurse Practitioner

## 2017-08-08 NOTE — Telephone Encounter (Signed)
Called [phone] to start PA with medicaid.   PA approved for 100 gram only. Pharmacy notified.

## 2017-08-12 ENCOUNTER — Telehealth: Payer: Self-pay | Admitting: *Deleted

## 2017-08-12 NOTE — Telephone Encounter (Signed)
Received call from patient stating she has been having pain in both of her feet and ankles with some swelling.  She has started back to work and she is on her feet a lot.  She describes the pain as burning and eventually goes up the back of her leg.  She did see her primary care MD who increased her neurontin which she states seems to be helping some, but was suggested by her primary MD to see [place] back. Appointment confirmed for 7/12 with Mendel Ryder, NP.

## 2017-08-15 ENCOUNTER — Telehealth: Payer: Self-pay | Admitting: Adult Health

## 2017-08-15 ENCOUNTER — Inpatient Hospital Stay: Payer: Medicaid Other | Attending: Adult Health | Admitting: Adult Health

## 2017-08-15 ENCOUNTER — Encounter: Payer: Self-pay | Admitting: Adult Health

## 2017-08-15 VITALS — BP 135/88 | HR 66 | Temp 98.4°F | Resp 18 | Ht 66.5 in | Wt 234.1 lb

## 2017-08-15 DIAGNOSIS — T451X5A Adverse effect of antineoplastic and immunosuppressive drugs, initial encounter: Secondary | ICD-10-CM

## 2017-08-15 DIAGNOSIS — M2550 Pain in unspecified joint: Secondary | ICD-10-CM

## 2017-08-15 DIAGNOSIS — G629 Polyneuropathy, unspecified: Secondary | ICD-10-CM

## 2017-08-15 DIAGNOSIS — Z803 Family history of malignant neoplasm of breast: Secondary | ICD-10-CM | POA: Diagnosis not present

## 2017-08-15 DIAGNOSIS — Z171 Estrogen receptor negative status [ER-]: Secondary | ICD-10-CM | POA: Diagnosis not present

## 2017-08-15 DIAGNOSIS — Z923 Personal history of irradiation: Secondary | ICD-10-CM | POA: Diagnosis not present

## 2017-08-15 DIAGNOSIS — N920 Excessive and frequent menstruation with regular cycle: Secondary | ICD-10-CM

## 2017-08-15 DIAGNOSIS — Z9221 Personal history of antineoplastic chemotherapy: Secondary | ICD-10-CM

## 2017-08-15 DIAGNOSIS — Z87442 Personal history of urinary calculi: Secondary | ICD-10-CM

## 2017-08-15 DIAGNOSIS — C50312 Malignant neoplasm of lower-inner quadrant of left female breast: Secondary | ICD-10-CM | POA: Diagnosis present

## 2017-08-15 DIAGNOSIS — G620 Drug-induced polyneuropathy: Secondary | ICD-10-CM

## 2017-08-15 DIAGNOSIS — D500 Iron deficiency anemia secondary to blood loss (chronic): Secondary | ICD-10-CM | POA: Diagnosis not present

## 2017-08-15 MED ORDER — DULOXETINE HCL 30 MG PO CPEP: 30.0000 mg | ORAL_CAPSULE | Freq: Every day | ORAL | 3 refills | Status: DC

## 2017-08-15 NOTE — Progress Notes (Signed)
[facility] Cancer Follow up:    Nche, Susan Lum, NP [address] [place] [address]   DIAGNOSIS: Cancer Staging Malignant neoplasm of lower-inner quadrant of left breast in female, estrogen receptor negative (HCC) Staging form: Breast, AJCC 8th Edition - Clinical stage from 04/03/2016: Stage IIIB (cT2, cN1, cM0, G3, ER: Negative, PR: Negative, HER2: Negative) - Unsigned   SUMMARY OF ONCOLOGIC HISTORY:   Malignant neoplasm of lower-inner quadrant of left breast in female, estrogen receptor negative (HCC)   03/22/2016 Initial Diagnosis    Left breast biopsy 8:00: IDC high-grade with necrosis, left axillary lymph node biopsy IDC, ER 0%, PR 0%, HER-2 negative ratio 1.42, Ki-67 90%; palpable lump 3.5 x 2.6 x 2.2 cm and 2.8 Center left axillary lymph node, T2 N1 stage IIB      04/10/2016 Initial Biopsy    Additional biopsies left breast 9:30: Sclerosed intraductal papilloma with every 3 hours, right breast 1:00: Complex sclerosing lesion with UDH, right breast 8:00: Complex sclerosing lesion with UDH      04/17/2016 - 08/28/2016 Neo-Adjuvant Chemotherapy    Dose dense Adriamycin and Cytoxan 4 followed by Taxol weekly 12      05/15/2016 Genetic Testing    Genetic counseling and testing for hereditary cancer syndromes performed on 05/15/2016. Results are negative for pathogenic mutations in 46 genes analyzed by [facility]'s Common Hereditary Cancers Panel. Results are dated 05/24/2016. Genes tested: APC, ATM, AXIN2, BARD1, BMPR1A, BRCA1, BRCA2, BRIP1, CDH1, CDKN2A, CHEK2, CTNNA1, DICER1, EPCAM, GREM1, HOXB13, KIT, MEN1, MLH1, MSH2, MSH3, MSH6, MUTYH, NBN, NF1, NTHL1, PALB2, PDGFRA, PMS2, POLD1, POLE, PTEN, RAD50, RAD51C, RAD51D, SDHA, SDHB, SDHC, SDHD, SMAD4, SMARCA4, STK11, TP53, TSC1, TSC2, and VHL.  A variant of uncertain significance (not clinically actionable) was noted in MUTYH.         10/24/2016 Surgery    Right lumpectomy: No malignancy identified.  Complete pathologic response. 0/4 lymph nodes negative, previously triple negative disease with a Ki-67 of 90%      12/04/2016 - 01/14/2017 Radiation Therapy    Adjuvant radiation therapy       CURRENT THERAPY: observation  INTERVAL HISTORY: Susan Morrison 46 y.o. female returns for urgent evaluation of her continued chemotherapy induced peripheral neuropathy.  This occurred while she was receiving Taxol chemotherapy.  She feels like it is worsening. She had mild peripheral neuropathy during chemotherapy.  She experienced it at this point in her fingertips and toes.  She did not have any balance changes, or motor deficits noted in her fingers, toes, hands or feet.  Since completing chemotherapy last year, she has noted that her right hand is stiff.  She has difficulty making a fist.  She also notes some stiffness in her knees and ankles and notes that sometimes her balance is off.  She notes numbing pain in her feet.  This is most noticeable at night.  She taking Gabapentin 600mg in the morning, 300mg in the afternoon, and 600mg in the evening.  This is helping.   Susan Morrison goes to the gym three times a week and walks on the treadmill for about 20 minutes and then the elliptical for 20 minutes.    [name] works at [employer] about 15-20 hours per week.  She works as a cashier/stocker.  She notes that her neuropathy occasionally will interfere with her job duties.  She notes that she cannot stand for long periods of time, and she also has difficulty with opening boxes with her right hand.       Patient Active Problem List   Diagnosis Date Noted  . Chemotherapy-induced peripheral neuropathy ([place]) 08/15/2017  . Port catheter in place 06/11/2016  . Genetic testing 06/03/2016  . Keratoconjunctivitis of both eyes 06/03/2016  . UPJ obstruction, congenital 05/22/2016  . Iron deficiency anemia due to chronic blood loss 04/03/2016  . Malignant neoplasm of lower-inner quadrant of left breast in  female, estrogen receptor negative ([place]) 03/26/2016  . Menorrhagia with regular cycle 03/05/2016  . Anemia 03/05/2016  . Left breast mass 03/05/2016  . Elevated liver enzymes 03/05/2016    has No Known Allergies.  MEDICAL HISTORY: Past Medical History:  Diagnosis Date  . Anemia   . Arthritis   . Breast mass 03/21/2016   left 7:00  . Cancer ([place])    left brest cancer   . Genetic testing 06/03/2016   [name] underwent genetic counseling and testing for hereditary cancer syndromes on 05/15/2016. Her results were negative for mutations in all 46 genes analyzed by [facility]'s 46-gene Common Hereditary Cancers Panel. Genes analyzed include: APC, ATM, AXIN2, BARD1, BMPR1A, BRCA1, BRCA2, BRIP1, CDH1, CDKN2A, CHEK2, CTNNA1, DICER1, EPCAM, GREM1, HOXB13, KIT, MEN1, MLH1, MSH2, MSH3, MSH6, MUTYH, NBN,  . History of blood transfusion   . History of kidney stones   . History of radiation therapy 12/04/16-01/14/17   left breast 50.4 Gy in 28 fractions  . Personal history of chemotherapy    March-June 2018  . Personal history of radiation therapy    Oct-Dec 2018  . PONV (postoperative nausea and vomiting)     SURGICAL HISTORY: Past Surgical History:  Procedure Laterality Date  . BREAST LUMPECTOMY Left   . DILATION AND CURETTAGE OF UTERUS     last done 2years ago  . PORT-A-CATH REMOVAL     12/2016   . PORTACATH PLACEMENT N/A 04/15/2016   Procedure: INSERTION PORT-A-CATH WITH [place];  Surgeon: Alphonsa Overall, MD;  Location: [facility];  Service: General;  Laterality: N/A;  . RADIOACTIVE SEED GUIDED AXILLARY SENTINEL LYMPH NODE Left 10/24/2016   Procedure: RADIOACTIVE SEED GUIDED AXILLARY TARGETED SENTINEL LYMPH NODE DISSECTION;  Surgeon: Alphonsa Overall, MD;  Location: [place];  Service: General;  Laterality: Left;  . RADIOACTIVE SEED GUIDED EXCISIONAL BREAST BIOPSY Bilateral 10/24/2016   Procedure: RIGHT BREAST RADIOACTIVE SEED GUIDED EXCISIONAL BREAST BIOPSIES X'S 2, LEFT BREAST  RADIOACTIVE SEED GUIDED EXCISIONAL BIOPSY X2;  Surgeon: Alphonsa Overall, MD;  Location: [place];  Service: General;  Laterality: Bilateral;  . ROBOT ASSISTED PYELOPLASTY Left 03/05/2017   Procedure: XI ROBOTIC ASSISTED PYELOPLASTY;  Surgeon: Ardis Hughs, MD;  Location: [facility];  Service: Urology;  Laterality: Left;  . TUBAL LIGATION  1996    SOCIAL HISTORY: Social History   Socioeconomic History  . Marital status: Married    Spouse name: Not on file  . Number of children: 3  . Years of education: Not on file  . Highest education level: Not on file  Occupational History  . Not on file  Social Needs  . Financial resource strain: Not on file  . Food insecurity:    Worry: Not on file    Inability: Not on file  . Transportation needs:    Medical: Not on file    Non-medical: Not on file  Tobacco Use  . Smoking status: Never Smoker  . Smokeless tobacco: Never Used  Substance and Sexual Activity  . Alcohol use: Yes    Comment: wine/social  . Drug use: No  . Sexual activity: Yes  Birth control/protection: Surgical  Lifestyle  . Physical activity:    Days per week: Not on file    Minutes per session: Not on file  . Stress: Not on file  Relationships  . Social connections:    Talks on phone: Not on file    Gets together: Not on file    Attends religious service: Not on file    Active member of club or organization: Not on file    Attends meetings of clubs or organizations: Not on file    Relationship status: Not on file  . Intimate partner violence:    Fear of current or ex partner: Not on file    Emotionally abused: Not on file    Physically abused: Not on file    Forced sexual activity: Not on file  Other Topics Concern  . Not on file  Social History Narrative  . Not on file    FAMILY HISTORY: Family History  Problem Relation Age of Onset  . Hypertension Mother   . Diabetes Mother   . Breast cancer Paternal Grandmother 45       d.62  bilateral breast cancer    Review of Systems  Constitutional: Negative for appetite change, chills, fatigue and unexpected weight change.  HENT:   Negative for hearing loss, lump/mass, sore throat and trouble swallowing.   Eyes: Negative for eye problems and icterus.  Respiratory: Negative for chest tightness, cough and shortness of breath.   Cardiovascular: Negative for chest pain, leg swelling and palpitations.  Gastrointestinal: Negative for abdominal distention, abdominal pain, constipation, diarrhea, nausea and vomiting.  Endocrine: Negative for hot flashes.  Genitourinary: Negative for difficulty urinating.   Musculoskeletal: Positive for arthralgias. Negative for back pain and neck stiffness.  Skin: Negative for itching and rash.  Neurological: Negative for dizziness, extremity weakness, headaches and numbness.  Hematological: Negative for adenopathy. Does not bruise/bleed easily.  Psychiatric/Behavioral: Negative for depression. The patient is not nervous/anxious.       PHYSICAL EXAMINATION  ECOG PERFORMANCE STATUS: 1 - Symptomatic but completely ambulatory  Vitals:   08/15/17 0838  BP: 135/88  Pulse: 66  Resp: 18  Temp: 98.4 F (36.9 C)  SpO2: 98%    Physical Exam  Constitutional: She is oriented to person, place, and time. She appears well-developed and well-nourished.  HENT:  Head: Normocephalic and atraumatic.  Mouth/Throat: Oropharynx is clear and moist. No oropharyngeal exudate.  Eyes: Pupils are equal, round, and reactive to light. No scleral icterus.  Neck: Neck supple.  Cardiovascular: Normal rate, regular rhythm and normal heart sounds.  Pulmonary/Chest: Effort normal and breath sounds normal.  Abdominal: Soft. Bowel sounds are normal. She exhibits no distension and no mass. There is no tenderness. There is no rebound and no guarding.  Musculoskeletal: Normal range of motion. She exhibits no edema.  Lymphadenopathy:    She has no cervical adenopathy.   Neurological: She is alert and oriented to person, place, and time.  Skin: Skin is warm and dry. Capillary refill takes less than 2 seconds. No rash noted.  Psychiatric: She has a normal mood and affect.    LABORATORY DATA:  CBC    Component Value Date/Time   WBC 6.0 02/28/2017 1047   RBC 5.83 (H) 02/28/2017 1047   HGB 12.9 03/06/2017 0440   HGB 12.4 08/28/2016 0908   HCT 37.1 03/06/2017 0440   HCT 36.5 08/28/2016 0908   PLT 170 02/28/2017 1047   PLT 241 08/28/2016 0908   MCV 70.8 (L)   02/28/2017 1047   MCV 75.3 (L) 08/28/2016 0908   MCH 24.4 (L) 02/28/2017 1047   MCHC 34.4 02/28/2017 1047   RDW 16.1 (H) 02/28/2017 1047   RDW 17.4 (H) 08/28/2016 0908   LYMPHSABS 1.0 08/28/2016 0908   MONOABS 0.5 08/28/2016 0908   EOSABS 0.1 08/28/2016 0908   BASOSABS 0.0 08/28/2016 0908    CMP     Component Value Date/Time   NA 137 03/07/2017 0406   NA 139 08/28/2016 0908   K 3.9 03/07/2017 0406   K 4.0 08/28/2016 0908   CL 106 03/07/2017 0406   CO2 26 03/07/2017 0406   CO2 23 08/28/2016 0908   GLUCOSE 88 03/07/2017 0406   GLUCOSE 92 08/28/2016 0908   BUN 16 03/07/2017 0406   BUN 8.0 08/28/2016 0908   CREATININE 0.85 03/07/2017 0406   CREATININE 0.7 08/28/2016 0908   CALCIUM 8.9 03/07/2017 0406   CALCIUM 9.8 08/28/2016 0908   PROT 6.8 08/28/2016 0908   ALBUMIN 3.4 (L) 08/28/2016 0908   AST 31 08/28/2016 0908   ALT 40 08/28/2016 0908   ALKPHOS 75 08/28/2016 0908   BILITOT 0.68 08/28/2016 0908   GFRNONAA >60 03/07/2017 0406   GFRAA >60 03/07/2017 0406      ASSESSMENT and PLAN:   Malignant neoplasm of lower-inner quadrant of left breast in female, estrogen receptor negative (HCC) Treatment summary: 1. 03/22/2016: Left breast biopsy 8:00: IDC high-grade with necrosis, left axillary lymph node biopsy IDC, ER 0%, PR 0%, HER-2 negative ratio 1.42, Ki-67 90%; palpable lump 3.5 x 2.6 x 2.2 cm and 2.8 Center left axillary lymph node, T2 N1 stage IIB 2. Neoadjuvant chemotherapy  with Adriamycin and Cytoxan dose dense 4 followed by Taxolweekly 12  completed 08/30/2016 2. 10/24/2016 Right lumpectomy: No malignancy identified. Complete pathologic response. 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 Adjuvant radiation therapy 12/04/2016-01/14/2017  Chemotherapy toxicities: 1. Chemotherapy-induced peripheral neuropathy: Continue Gabapentin.  Add Cymbalta 30mg daily x 1 week then increase to 2 tab daily.  She will call us in 2 weeks and tell us how she is doing on this.    2. Arthralgias.  These are atypical for chemotherapy.  I am concerned there is some possible autoimmune component.  I reviewed with Dr. Gudena and we decided to refer to rheumatology.    Shandora will return in November for her SCP visit.     Orders Placed This Encounter  Procedures  . Ambulatory referral to Rheumatology    Referral Priority:   Urgent    Referral Type:   Consultation    Referral Reason:   Specialty Services Required    Requested Specialty:   Rheumatology    Number of Visits Requested:   1    All questions were answered. The patient knows to call the clinic with any problems, questions or concerns. We can certainly see the patient much sooner if necessary.  A total of (30) minutes of face-to-face time was spent with this patient with greater than 50% of that time in counseling and care-coordination.  This note was electronically signed. Lindsey C Causey, NP 08/15/2017 

## 2017-08-15 NOTE — Patient Instructions (Signed)

## 2017-08-15 NOTE — Telephone Encounter (Signed)
Per 7/12 no los

## 2017-08-15 NOTE — Assessment & Plan Note (Signed)
Treatment summary: 1. 03/22/2016: Left breast biopsy 8:00: IDC high-grade with necrosis, left axillary lymph node biopsy IDC, ER 0%, PR 0%, HER-2 negative ratio 1.42, Ki-67 90%; palpable lump 3.5 x 2.6 x 2.2 cm and 2.8 Center left axillary lymph node, T2 N1 stage IIB 2. Neoadjuvant chemotherapy with Adriamycin and Cytoxan dose dense 4 followed by Taxolweekly 12  completed 08/30/2016 2. 10/24/2016 Right lumpectomy: No malignancy identified. Complete pathologic response. 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 Adjuvant radiation therapy 12/04/2016-01/14/2017  Chemotherapy toxicities: 1. Chemotherapy-induced peripheral neuropathy: Continue Gabapentin.  Add Cymbalta 50m daily x 1 week then increase to 2 tab daily.  She will call [place] 2 weeks and tell [place] she is doing on this.    2. Arthralgias.  These are atypical for chemotherapy.  I am concerned there is some possible autoimmune component.  I reviewed with Dr. GLindi Adieand we decided to refer to rheumatology.    LRanettawill return in November for her SCP visit.

## 2017-08-18 ENCOUNTER — Other Ambulatory Visit: Payer: Self-pay | Admitting: Nurse Practitioner

## 2017-08-18 ENCOUNTER — Encounter: Payer: Self-pay | Admitting: Nurse Practitioner

## 2017-08-18 DIAGNOSIS — G629 Polyneuropathy, unspecified: Secondary | ICD-10-CM

## 2017-08-30 IMAGING — NM NM RENAL IMAGING FLOW W/ PHARM
2 series · 12 of 12 positions shown · non-contrast
Comparison: CT 04/12/2016

CLINICAL DATA: LEFT hydronephrosis.  UPJ obstruction.

EXAM:
NUCLEAR MEDICINE RENAL SCAN WITH DIURETIC ADMINISTRATION
TECHNIQUE: Radionuclide angiographic and sequential renal images were obtained
after intravenous injection of radiopharmaceutical. Imaging was
continued during slow intravenous injection of Lasix approximately
15 minutes after the start of the examination.
RADIOPHARMACEUTICALS:  5.5 mCi Jechnetium-22m MAG3 IV

[Series 1: renal scan · 4.14mm/px · 6 of 90 frames shown (1 of 2)]
[frame 8/90]
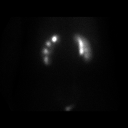
[frame 23/90]
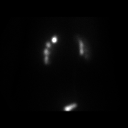
[frame 38/90]
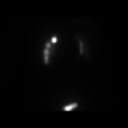
[frame 53/90]
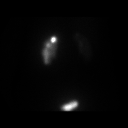
[frame 68/90]
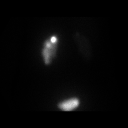
[frame 83/90]
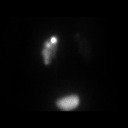

[Series 1: renal scan · 4.14mm/px · 6 of 40 frames shown (2 of 2)]
[frame 4/40  full-range]
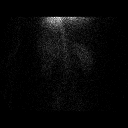
[frame 10/40  full-range]
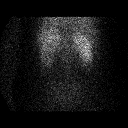
[frame 17/40  full-range]
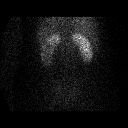
[frame 24/40  full-range]
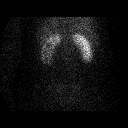
[frame 30/40  full-range]
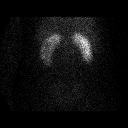
[frame 37/40  full-range]
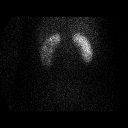

[12 of 12 positions shown; findings below may reference images not displayed]

FINDINGS: Flow:  Prompt symmetric arterial flow to the kidneys.

Left renogram: Delayed renal cortical uptake within LEFT kidney
compared to the RIGHT. There is pooling of counts within the renal
calices. Pooling of counts within the LEFT renal pelvis LEFT renal
pelvis. Minimal clearance from the renal pelvis. Moderate to large
postvoid residual.

Right renogram: Uniform uptake of counts in the renal cortex. Counts
are promptly excreted into the collecting system and cleared prior
to administration of Lasix. Lasix augment clearance. No postvoid
residual.

Differential:

Left kidney = 39 %

Right kidney = 61 %

T1/2 post Lasix :

Left kidney = greater than 25 min.  T [DATE] not achieved.

Right kidney = 6.3 min
IMPRESSION: 1. Obstructive hydronephrosis of the LEFT kidney at the ureteral
pelvic junction.
2. Moderate to large postvoid residual on the LEFT.
3. Normal RIGHT kidney.

## 2017-09-03 ENCOUNTER — Other Ambulatory Visit: Payer: Self-pay | Admitting: Nurse Practitioner

## 2017-09-03 DIAGNOSIS — G629 Polyneuropathy, unspecified: Secondary | ICD-10-CM

## 2017-09-04 ENCOUNTER — Telehealth (HOSPITAL_COMMUNITY): Payer: Self-pay | Admitting: *Deleted

## 2017-09-04 MED ORDER — GABAPENTIN 300 MG PO CAPS: 300.0000 mg | ORAL_CAPSULE | Freq: Two times a day (BID) | ORAL | 0 refills | Status: DC

## 2017-09-04 NOTE — Telephone Encounter (Signed)
30 days sent. Need to see PCP for more refills.

## 2017-09-04 NOTE — Telephone Encounter (Signed)
Telephoned patient at home number and left message to return call to [facility] 

## 2017-09-11 ENCOUNTER — Encounter: Payer: Self-pay | Admitting: Nurse Practitioner

## 2017-09-11 ENCOUNTER — Ambulatory Visit: Payer: Medicaid Other | Admitting: Nurse Practitioner

## 2017-09-11 VITALS — BP 124/82 | HR 64 | Temp 98.3°F | Ht 66.0 in | Wt 228.2 lb

## 2017-09-11 DIAGNOSIS — D500 Iron deficiency anemia secondary to blood loss (chronic): Secondary | ICD-10-CM | POA: Diagnosis not present

## 2017-09-11 DIAGNOSIS — G629 Polyneuropathy, unspecified: Secondary | ICD-10-CM

## 2017-09-11 DIAGNOSIS — Z114 Encounter for screening for human immunodeficiency virus [HIV]: Secondary | ICD-10-CM

## 2017-09-11 LAB — COMPREHENSIVE METABOLIC PANEL
ALT: 34 U/L (ref 0–35)
AST: 29 U/L (ref 0–37)
Albumin: 3.9 g/dL (ref 3.5–5.2)
Alkaline Phosphatase: 95 U/L (ref 39–117)
BILIRUBIN TOTAL: 0.7 mg/dL (ref 0.2–1.2)
BUN: 11 mg/dL (ref 6–23)
CALCIUM: 9.3 mg/dL (ref 8.4–10.5)
CHLORIDE: 105 meq/L (ref 96–112)
CO2: 24 meq/L (ref 19–32)
CREATININE: 0.66 mg/dL (ref 0.40–1.20)
GFR: 124.02 mL/min (ref >60.00)
Glucose, Bld: 98 mg/dL (ref 70–99)
Potassium: 4.9 mEq/L (ref 3.5–5.1)
SODIUM: 140 meq/L (ref 135–145)
Total Protein: 6.9 g/dL (ref 6.0–8.3)

## 2017-09-11 LAB — CBC
HEMATOCRIT: 35.7 % — AB (ref 36.0–46.0)
Hemoglobin: 11.7 g/dL — ABNORMAL LOW (ref 12.0–15.0)
MCHC: 32.9 g/dL (ref 30.0–36.0)
MCV: 74.6 fl — AB (ref 78.0–100.0)
Platelets: 221 10*3/uL (ref 150.0–400.0)
RBC: 4.78 Mil/uL (ref 3.87–5.11)
RDW: 17.3 % — AB (ref 11.5–15.5)
WBC: 5.1 10*3/uL (ref 4.0–10.5)

## 2017-09-11 LAB — IBC PANEL
Iron: 219 ug/dL — ABNORMAL HIGH (ref 42–145)
SATURATION RATIOS: 80.6 % — AB (ref 20.0–50.0)
Transferrin: 194 mg/dL — ABNORMAL LOW (ref 212.0–360.0)

## 2017-09-11 LAB — TSH: TSH: 3.6 u[IU]/mL (ref 0.35–4.50)

## 2017-09-11 LAB — B12 AND FOLATE PANEL
FOLATE: 16.2 ng/mL (ref >5.9)
VITAMIN B 12: 290 pg/mL (ref 211–911)

## 2017-09-11 LAB — HEMOGLOBIN A1C: HEMOGLOBIN A1C: 5.1 % (ref 4.6–6.5)

## 2017-09-11 MED ORDER — GABAPENTIN 300 MG PO CAPS: 300.0000 mg | ORAL_CAPSULE | Freq: Two times a day (BID) | ORAL | 6 refills | Status: DC

## 2017-09-11 NOTE — Progress Notes (Signed)
Subjective:  Patient ID: Susan Morrison, female    DOB: 1970/12/12  Age: 47 y.o. MRN: [id number]  CC: Follow-up (pt sts she still is having the pain in legs and feet, meds are helping the pain 2/10. pt due for pap: getting one today with Dr. Zola [facility])  HPI  Neuropathy: Improved with gabapentin use Did not get blood draw due to "hard stick" Denies any adverse effects with medication.  Reviewed past Medical, Social and Family history today.  Outpatient Medications Prior to Visit  Medication Sig Dispense Refill  . acetaminophen (TYLENOL) 500 MG tablet Take 1,000 mg by mouth every 6 (six) hours as needed (for pain.).     Marland Kitchen diclofenac sodium (VOLTAREN) 1 % GEL Apply 2 g topically 2 times daily at 12 noon and 4 pm. 100 g 1  . DULoxetine (CYMBALTA) 30 MG capsule Take 1 capsule (30 mg total) by mouth daily. After one week increase to 60 mg 60 capsule 3  . ferrous sulfate 325 (65 FE) MG EC tablet Take 325 mg by mouth every other day. In the morning    . gabapentin (NEURONTIN) 300 MG capsule Take 1 capsule (300 mg total) by mouth 2 (two) times daily. 60 capsule 0   No facility-administered medications prior to visit.     ROS See HPI  Objective:  BP 124/82   Pulse 64   Temp 98.3 F (36.8 C) (Oral)   Ht 5\' 6"  (1.676 m)   Wt 228 lb 3.2 oz (103.5 kg)   LMP 09/03/2017   SpO2 97%   BMI 36.83 kg/m   BP Readings from Last 3 Encounters:  09/11/17 124/82  08/15/17 135/88  08/06/17 126/86    Wt Readings from Last 3 Encounters:  09/11/17 228 lb 3.2 oz (103.5 kg)  08/15/17 234 lb 1.6 oz (106.2 kg)  08/06/17 226 lb (102.5 kg)    Physical Exam  Constitutional: She is oriented to person, place, and time.  Cardiovascular: Normal rate.  Pulmonary/Chest: Effort normal.  Musculoskeletal: She exhibits no edema.  Neurological: She is alert and oriented to person, place, and time.  Psychiatric: She has a normal mood and affect. Her behavior is normal. Thought content  normal.  Vitals reviewed.   Lab Results  Component Value Date   WBC 5.1 09/11/2017   HGB 11.7 (L) 09/11/2017   HCT 35.7 (L) 09/11/2017   PLT 221.0 09/11/2017   GLUCOSE 98 09/11/2017   CHOL 101 03/05/2016   TRIG 101.0 03/05/2016   HDL 37.60 (L) 03/05/2016   LDLCALC 43 03/05/2016   ALT 34 09/11/2017   AST 29 09/11/2017   NA 140 09/11/2017   K 4.9 09/11/2017   CL 105 09/11/2017   CREATININE 0.66 09/11/2017   BUN 11 09/11/2017   CO2 24 09/11/2017   TSH 3.60 09/11/2017   HGBA1C 5.1 09/11/2017    Mm Diag Breast Tomo Bilateral  Result Date: 05/09/2017 CLINICAL DATA:  47 year old female for annual bilateral mammograms. History of LEFT breast cancer and lumpectomy in 2018. History of bilateral papillomas and RIGHT complex sclerosing lesion with breast excisions. EXAM: DIGITAL DIAGNOSTIC BILATERAL MAMMOGRAM WITH CAD AND TOMO COMPARISON:  Previous exam(s). ACR Breast Density Category c: The breast tissue is heterogeneously dense, which may obscure small masses. FINDINGS: 2D and 3D full field views of both breasts and a magnification view of the lumpectomy site demonstrate no suspicious mass, nonsurgical distortion or worrisome calcifications. Surgical changes within both breasts identified. Mammographic images were processed with  CAD. IMPRESSION: No mammographic evidence of breast malignancy. Bilateral breast surgical changes. RECOMMENDATION: Bilateral diagnostic mammograms in 1 year. Consider annual/biennial bilateral breast MRI given that the LEFT breast cancer was barely visible mammographically. I have discussed the findings and recommendations with the patient. Results were also provided in writing at the conclusion of the visit. If applicable, a reminder letter will be sent to the patient regarding the next appointment. BI-RADS CATEGORY  2: Benign. Electronically Signed   By: Margarette Canada M.D.   On: 05/09/2017 12:25    Assessment & Plan:   Diella was seen today for follow-up.  Diagnoses  and all orders for this visit:  Peripheral polyneuropathy -     gabapentin (NEURONTIN) 300 MG capsule; Take 1 capsule (300 mg total) by mouth 2 (two) times daily. -     Hemoglobin A1c -     TSH -     Comprehensive metabolic panel -     CBC -     B12 and Folate Panel -     Ambulatory referral to Neurology  Encounter for screening for human immunodeficiency virus (HIV) -     HIV antibody  Iron deficiency anemia due to chronic blood loss -     CBC -     IBC panel   I am having Aury M. Gorby maintain her acetaminophen, ferrous sulfate, diclofenac sodium, DULoxetine, and gabapentin.  Meds ordered this encounter  Medications  . gabapentin (NEURONTIN) 300 MG capsule    Sig: Take 1 capsule (300 mg total) by mouth 2 (two) times daily.    Dispense:  60 capsule    Refill:  6    Order Specific Question:   Supervising Provider    Answer:   Lucille Passy [[id number]]    Follow-up: Return in about 6 months (around 03/14/2018) for CPE (fasting).  Wilfred Lacy, NP

## 2017-09-11 NOTE — Patient Instructions (Addendum)
Normal CMP Iron panel indicates improved iron level. decrease ferrous sulfate twice a week only. Normal HgbA1c, TSH, B12 and folate.  Continue gabapentin as prescribed

## 2017-09-12 ENCOUNTER — Encounter: Payer: Self-pay | Admitting: Nurse Practitioner

## 2017-09-12 ENCOUNTER — Encounter: Payer: Self-pay | Admitting: Neurology

## 2017-09-12 LAB — HIV ANTIBODY (ROUTINE TESTING W REFLEX): HIV 1&2 Ab, 4th Generation: NONREACTIVE

## 2017-10-07 ENCOUNTER — Other Ambulatory Visit: Payer: Self-pay | Admitting: Nurse Practitioner

## 2017-10-07 ENCOUNTER — Encounter: Payer: Self-pay | Admitting: Nurse Practitioner

## 2017-10-07 DIAGNOSIS — Z01419 Encounter for gynecological examination (general) (routine) without abnormal findings: Secondary | ICD-10-CM

## 2017-10-08 ENCOUNTER — Encounter: Payer: Self-pay | Admitting: Nurse Practitioner

## 2017-10-08 DIAGNOSIS — D259 Leiomyoma of uterus, unspecified: Secondary | ICD-10-CM | POA: Insufficient documentation

## 2017-10-30 ENCOUNTER — Inpatient Hospital Stay: Payer: Medicaid Other | Attending: Adult Health | Admitting: Adult Health

## 2017-10-30 ENCOUNTER — Encounter: Payer: Self-pay | Admitting: Adult Health

## 2017-10-30 ENCOUNTER — Telehealth: Payer: Self-pay | Admitting: Hematology and Oncology

## 2017-10-30 VITALS — BP 129/87 | HR 67 | Temp 98.0°F | Resp 18 | Ht 66.0 in | Wt 227.7 lb

## 2017-10-30 DIAGNOSIS — Z87442 Personal history of urinary calculi: Secondary | ICD-10-CM | POA: Diagnosis not present

## 2017-10-30 DIAGNOSIS — Z803 Family history of malignant neoplasm of breast: Secondary | ICD-10-CM

## 2017-10-30 DIAGNOSIS — Z923 Personal history of irradiation: Secondary | ICD-10-CM | POA: Insufficient documentation

## 2017-10-30 DIAGNOSIS — Z171 Estrogen receptor negative status [ER-]: Secondary | ICD-10-CM | POA: Diagnosis not present

## 2017-10-30 DIAGNOSIS — Z9221 Personal history of antineoplastic chemotherapy: Secondary | ICD-10-CM | POA: Insufficient documentation

## 2017-10-30 DIAGNOSIS — Z79899 Other long term (current) drug therapy: Secondary | ICD-10-CM | POA: Insufficient documentation

## 2017-10-30 DIAGNOSIS — C50312 Malignant neoplasm of lower-inner quadrant of left female breast: Secondary | ICD-10-CM | POA: Diagnosis not present

## 2017-10-30 NOTE — Progress Notes (Signed)
CLINIC:  Survivorship   REASON FOR VISIT:  Routine follow-up post-treatment for a recent history of breast cancer.  BRIEF ONCOLOGIC HISTORY:    Malignant neoplasm of lower-inner quadrant of left breast in female, estrogen receptor negative ([place])   03/22/2016 Initial Diagnosis    Left breast biopsy 8:00: IDC high-grade with necrosis, left axillary lymph node biopsy IDC, ER 0%, PR 0%, HER-2 negative ratio 1.42, Ki-67 90%; palpable lump 3.5 x 2.6 x 2.2 cm and 2.8 Center left axillary lymph node, T2 N1 stage IIB    04/10/2016 Initial Biopsy    Additional biopsies left breast 9:30: Sclerosed intraductal papilloma with every 3 hours, right breast 1:00: Complex sclerosing lesion with UDH, right breast 8:00: Complex sclerosing lesion with UDH    04/17/2016 - 08/28/2016 Neo-Adjuvant Chemotherapy    Dose dense Adriamycin and Cytoxan 4 followed by Taxol weekly 12    05/15/2016 Genetic Testing    Genetic counseling and testing for hereditary cancer syndromes performed on 05/15/2016. Results are negative for pathogenic mutations in 46 genes analyzed by [facility]'s Common Hereditary Cancers Panel. Results are dated 05/24/2016. Genes tested: APC, ATM, AXIN2, BARD1, BMPR1A, BRCA1, BRCA2, BRIP1, CDH1, CDKN2A, CHEK2, CTNNA1, DICER1, EPCAM, GREM1, HOXB13, KIT, MEN1, MLH1, MSH2, MSH3, MSH6, MUTYH, NBN, NF1, NTHL1, PALB2, PDGFRA, PMS2, POLD1, POLE, PTEN, RAD50, RAD51C, RAD51D, SDHA, SDHB, SDHC, SDHD, SMAD4, SMARCA4, STK11, TP53, TSC1, TSC2, and VHL.  A variant of uncertain significance (not clinically actionable) was noted in MUTYH.       10/24/2016 Surgery    Right lumpectomy: No malignancy identified. Complete pathologic response. 0/4 lymph nodes negative, previously triple negative disease with a Ki-67 of 90%    12/04/2016 - 01/14/2017 Radiation Therapy    Adjuvant radiation therapy     INTERVAL HISTORY:  Ms. Engdahl presents to the [facility] today for our initial meeting to review her  survivorship care plan detailing her treatment course for breast cancer, as well as monitoring long-term side effects of that treatment, education regarding health maintenance, screening, and overall wellness and health promotion.     Overall, Ms. Snavely reports feeling quite well.  She started back to work in March, 2019.  She has noted increased right foot pain, swelling and cramping.  She works as a Scientist, water quality at [employer].  She stands at work anywhere from 5-8 hours.  She notes that she had a heavy menstrual cycle x 12 days after it being absent since starting chemotherapy.  She is going to see Dr. Marvel Plan in gynecology on 10/17.      REVIEW OF SYSTEMS:  Review of Systems  Constitutional: Negative for appetite change, chills, fatigue, fever and unexpected weight change.  HENT:   Negative for hearing loss, lump/mass and trouble swallowing.   Eyes: Negative for eye problems and icterus.  Respiratory: Negative for chest tightness, cough and shortness of breath.   Cardiovascular: Negative for chest pain, leg swelling and palpitations.  Gastrointestinal: Negative for abdominal distention, abdominal pain, constipation, diarrhea, nausea and vomiting.  Endocrine: Negative for hot flashes.  Skin: Negative for itching and rash.  Neurological: Negative for dizziness, extremity weakness, headaches, light-headedness and numbness.  Hematological: Negative for adenopathy. Does not bruise/bleed easily.  Psychiatric/Behavioral: Negative for depression. The patient is not nervous/anxious.    Breast: Denies any new nodularity, masses, tenderness, nipple changes, or nipple discharge.      ONCOLOGY TREATMENT TEAM:  1. Surgeon:  Dr. Lucia Gaskins at [facility] 2. Medical Oncologist: Dr. Lindi Adie  3. Radiation Oncologist:  Dr. Isidore Moos    PAST MEDICAL/SURGICAL HISTORY:  Past Medical History:  Diagnosis Date  . Anemia   . Arthritis   . Breast mass 03/21/2016   left 7:00  . Cancer ([place])    left  brest cancer   . Genetic testing 06/03/2016   Ms. Caulfield underwent genetic counseling and testing for hereditary cancer syndromes on 05/15/2016. Her results were negative for mutations in all 46 genes analyzed by [facility]'s 46-gene Common Hereditary Cancers Panel. Genes analyzed include: APC, ATM, AXIN2, BARD1, BMPR1A, BRCA1, BRCA2, BRIP1, CDH1, CDKN2A, CHEK2, CTNNA1, DICER1, EPCAM, GREM1, HOXB13, KIT, MEN1, MLH1, MSH2, MSH3, MSH6, MUTYH, NBN,  . History of blood transfusion   . History of kidney stones   . History of radiation therapy 12/04/16-01/14/17   left breast 50.4 Gy in 28 fractions  . Personal history of chemotherapy    March-June 2018  . Personal history of radiation therapy    Oct-Dec 2018  . PONV (postoperative nausea and vomiting)    Past Surgical History:  Procedure Laterality Date  . BREAST LUMPECTOMY Left   . DILATION AND CURETTAGE OF UTERUS     last done 2years ago  . PORT-A-CATH REMOVAL     12/2016   . PORTACATH PLACEMENT N/A 04/15/2016   Procedure: INSERTION PORT-A-CATH WITH [place];  Surgeon: Alphonsa Overall, MD;  Location: [facility];  Service: General;  Laterality: N/A;  . RADIOACTIVE SEED GUIDED AXILLARY SENTINEL LYMPH NODE Left 10/24/2016   Procedure: RADIOACTIVE SEED GUIDED AXILLARY TARGETED SENTINEL LYMPH NODE DISSECTION;  Surgeon: Alphonsa Overall, MD;  Location: [place];  Service: General;  Laterality: Left;  . RADIOACTIVE SEED GUIDED EXCISIONAL BREAST BIOPSY Bilateral 10/24/2016   Procedure: RIGHT BREAST RADIOACTIVE SEED GUIDED EXCISIONAL BREAST BIOPSIES X'S 2, LEFT BREAST RADIOACTIVE SEED GUIDED EXCISIONAL BIOPSY X2;  Surgeon: Alphonsa Overall, MD;  Location: [place];  Service: General;  Laterality: Bilateral;  . ROBOT ASSISTED PYELOPLASTY Left 03/05/2017   Procedure: XI ROBOTIC ASSISTED PYELOPLASTY;  Surgeon: Ardis Hughs, MD;  Location: [facility];  Service: Urology;  Laterality: Left;  . TUBAL LIGATION  1996     ALLERGIES:  No Known  Allergies   CURRENT MEDICATIONS:  Outpatient Encounter Medications as of 10/30/2017  Medication Sig  . acetaminophen (TYLENOL) 500 MG tablet Take 1,000 mg by mouth every 6 (six) hours as needed (for pain.).   Marland Kitchen diclofenac sodium (VOLTAREN) 1 % GEL Apply 2 g topically 2 times daily at 12 noon and 4 pm.  . DULoxetine (CYMBALTA) 30 MG capsule Take 1 capsule (30 mg total) by mouth daily. After one week increase to 60 mg  . ferrous sulfate 325 (65 FE) MG EC tablet Take 325 mg by mouth every other day. In the morning  . gabapentin (NEURONTIN) 300 MG capsule Take 1 capsule (300 mg total) by mouth 2 (two) times daily.   No facility-administered encounter medications on file as of 10/30/2017.      ONCOLOGIC FAMILY HISTORY:  Family History  Problem Relation Age of Onset  . Hypertension Mother   . Diabetes Mother   . Breast cancer Paternal Grandmother 73       d.62 bilateral breast cancer     GENETIC COUNSELING/TESTING: negative  SOCIAL HISTORY:  Social History   Socioeconomic History  . Marital status: Married    Spouse name: Not on file  . Number of children: 3  . Years of education: Not on file  . Highest education level: Not on file  Occupational  History  . Not on file  Social Needs  . Financial resource strain: Not on file  . Food insecurity:    Worry: Not on file    Inability: Not on file  . Transportation needs:    Medical: Not on file    Non-medical: Not on file  Tobacco Use  . Smoking status: Never Smoker  . Smokeless tobacco: Never Used  Substance and Sexual Activity  . Alcohol use: Yes    Comment: wine/social  . Drug use: No  . Sexual activity: Yes    Birth control/protection: Surgical  Lifestyle  . Physical activity:    Days per week: Not on file    Minutes per session: Not on file  . Stress: Not on file  Relationships  . Social connections:    Talks on phone: Not on file    Gets together: Not on file    Attends religious service: Not on file     Active member of club or organization: Not on file    Attends meetings of clubs or organizations: Not on file    Relationship status: Not on file  . Intimate partner violence:    Fear of current or ex partner: Not on file    Emotionally abused: Not on file    Physically abused: Not on file    Forced sexual activity: Not on file  Other Topics Concern  . Not on file  Social History Narrative  . Not on file      PHYSICAL EXAMINATION:  Vital Signs:   Vitals:   10/30/17 0935  BP: 129/87  Pulse: 67  Resp: 18  Temp: 98 F (36.7 C)  SpO2: 100%   Filed Weights   10/30/17 0935  Weight: 227 lb 11.2 oz (103.3 kg)   General: Well-nourished, well-appearing female in no acute distress.  She is accompanied in clinic by her significant other today.   HEENT: Head is normocephalic.  Pupils equal and reactive to light. Conjunctivae clear without exudate.  Sclerae anicteric. Oral mucosa is pink, moist.  Oropharynx is pink without lesions or erythema.  Lymph: No cervical, supraclavicular, or infraclavicular lymphadenopathy noted on palpation.  Cardiovascular: Regular rate and rhythm.Marland Kitchen Respiratory: Clear to auscultation bilaterally. Chest expansion symmetric; breathing non-labored.  Breasts: right breast is benign, left breast with radiation changes and lumpectomy site noted, no sign of recurrence GI: Abdomen soft and round; non-tender, non-distended. Bowel sounds normoactive.  GU: Deferred.  Neuro: No focal deficits. Steady gait.  Psych: Mood and affect normal and appropriate for situation.  Extremities: No edema. MSK: No focal spinal tenderness to palpation.  Full range of motion in bilateral upper extremities Skin: Warm and dry.  LABORATORY DATA:  None for this visit.  DIAGNOSTIC IMAGING:  None for this visit.      ASSESSMENT AND PLAN:  Ms.. Gallaway is a pleasant 47 y.o. 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  She presents to the Survivorship Clinic for our initial meeting and routine follow-up post-completion of treatment for breast cancer.    1. Stage IIIB left breast cancer:  Ms. Osterberg is continuing to recover from definitive treatment for breast cancer. She will follow-up with her medical oncologist, Dr. Lindi Adie with history and physical exam per surveillance protocol.  After review with Dr. Lindi Adie and reviewing her mammogram from 05/2017, an MRI is indicated since her breast cancer was barely visible on mammography.  I ordered an MRI for 11/2017. Today, a comprehensive survivorship care plan and treatment summary was reviewed with the patient today detailing her breast cancer diagnosis, treatment course, potential late/long-term effects of treatment, appropriate follow-up care with recommendations for the future, and patient education resources.  A copy of this summary, along with a letter will be sent to the patient's primary care provider via mail/fax/In Basket message after today's visit.    2. Bone health:  Given Ms. Andreason history of breast cancer, she is at slight risk for bone demineralization.  She was given education on specific activities to promote bone health.  3. Cancer screening:  Due to Ms. Igo history and her age, she should receive screening for skin cancers, colon cancer, and gynecologic cancers.  The information and recommendations are listed on the patient's comprehensive care plan/treatment summary and were reviewed in detail with the patient.    4. Health maintenance and wellness promotion: Ms. Orchard was encouraged to consume 5-7 servings of fruits and vegetables per day. We reviewed the "Nutrition [employer]" handout, as well as the handout "Take Control of Your Health and Reduce Your Cancer Risk" from the [employer].  She was also encouraged to engage in moderate to vigorous exercise for 30  minutes per day most days of the week. We discussed the [employer] fitness program, which is designed for cancer survivors to help them become more physically fit after cancer treatments.  She was instructed to limit her alcohol consumption and continue to abstain from tobacco use.     5. Support services/counseling: It is not uncommon for this period of the patient's cancer care trajectory to be one of many emotions and stressors.  We discussed an opportunity for her to participate in the next session of [facility] ("Finding Your New Normal") support group series designed for patients after they have completed treatment.   Ms. Boggus was encouraged to take advantage of our many other support services programs, support groups, and/or counseling in coping with her new life as a cancer survivor after completing anti-cancer treatment.  She was offered support today through active listening and expressive supportive counseling.  She was given information regarding our available services and encouraged to contact me with any questions or for help enrolling in any of our support group/programs.    Dispo:   -Return to cancer center in 6 months for f/u with Dr. Lindi Adie -Mammogram due in 05/2018 -MRI 11/2017 -She is welcome to return back to the Survivorship Clinic at any time; no additional follow-up needed at this time.  -Consider referral back to survivorship as a long-term survivor for continued surveillance  A total of (30) minutes of face-to-face time was spent with this patient with greater than 50% of that time in counseling and care-coordination.   Gardenia Phlegm, NP Survivorship Program [facility] [phone]   Note: PRIMARY CARE PROVIDER Flossie Buffy, [place] [phone]

## 2017-10-30 NOTE — Telephone Encounter (Signed)
Gave patient avs and calendar.  GI center will give patient a call to schedule MRI.

## 2017-11-14 ENCOUNTER — Ambulatory Visit: Payer: Medicaid Other | Admitting: Neurology

## 2017-11-14 ENCOUNTER — Encounter: Payer: Self-pay | Admitting: Neurology

## 2017-11-14 VITALS — BP 120/84 | HR 69 | Ht 66.0 in | Wt 229.0 lb

## 2017-11-14 DIAGNOSIS — M25572 Pain in left ankle and joints of left foot: Secondary | ICD-10-CM

## 2017-11-14 DIAGNOSIS — R200 Anesthesia of skin: Secondary | ICD-10-CM

## 2017-11-14 DIAGNOSIS — G8929 Other chronic pain: Secondary | ICD-10-CM

## 2017-11-14 DIAGNOSIS — R202 Paresthesia of skin: Secondary | ICD-10-CM

## 2017-11-14 DIAGNOSIS — M25571 Pain in right ankle and joints of right foot: Secondary | ICD-10-CM | POA: Diagnosis not present

## 2017-11-14 NOTE — Patient Instructions (Addendum)
NCS/EMG of the right > left leg.  Please do not apply lotion on your legs.   Start vitamin B12 1054mcg daily as a supplement  Please follow-up with your primary care doctor about ankle pain  We will call you with the results and let you know the next step

## 2017-11-14 NOTE — Progress Notes (Signed)
[facility] Clinic Note - Initial Visit   Date: 11/14/17  Susan Morrison MRN: [id number] DOB: February 20, 1970   Dear Dr. Lorayne Marek:  Thank you for your kind referral of Susan Morrison for consultation of right toe numbness. Although her history is well known to you, please allow [place] to reiterate it for the purpose of our medical record. The patient was accompanied to the clinic by self.   History of Present Illness: Susan Morrison is a 47 y.o. right-handed African American female with left breast cancer s/p lumpectomy, radiation and chemotherapy (2018) presenting for evaluation of right toe numbness.    She was diagnosed with breast cancer in early March 2018 and started chemotherapy with adriamycin, cytoxan, and taxol.  She completed chemotherapy in July and by November, she began having swelling of the feet, along with numbness of the toes.  The numbness of the toes improved over a few months, only occurs intermittently now.  She continues to have numbness and tingling of the right great and middle toe, which is constant. She does not have similar symptoms on the left.  She saw her PCP for these symptoms and had labs check including TSH, HbA1c, folate, and vitamin B12 which were normal (see below).  She also complains of achiness of the ankles which is worse with walking and standing.  She does not have back pain, weakness, or imbalance.  She takes tylenol which eases some of the pain.      Out-side paper records, electronic medical record, and images have been reviewed where available and summarized as:  Lab Results  Component Value Date   TSH 3.60 09/11/2017   Lab Results  Component Value Date   VITAMINB12 290 09/11/2017   Lab Results  Component Value Date   FOLATE 16.2 09/11/2017   Lab Results  Component Value Date   HGBA1C 5.1 09/11/2017     Past Medical History:  Diagnosis Date  . Anemia   . Arthritis   . Breast mass 03/21/2016   left 7:00    . Cancer ([place])    left brest cancer   . Genetic testing 06/03/2016   Ms. Barfuss underwent genetic counseling and testing for hereditary cancer syndromes on 05/15/2016. Her results were negative for mutations in all 46 genes analyzed by [facility]'s 46-gene Common Hereditary Cancers Panel. Genes analyzed include: APC, ATM, AXIN2, BARD1, BMPR1A, BRCA1, BRCA2, BRIP1, CDH1, CDKN2A, CHEK2, CTNNA1, DICER1, EPCAM, GREM1, HOXB13, KIT, MEN1, MLH1, MSH2, MSH3, MSH6, MUTYH, NBN,  . History of blood transfusion   . History of kidney stones   . History of radiation therapy 12/04/16-01/14/17   left breast 50.4 Gy in 28 fractions  . Personal history of chemotherapy    March-June 2018  . Personal history of radiation therapy    Oct-Dec 2018  . PONV (postoperative nausea and vomiting)     Past Surgical History:  Procedure Laterality Date  . BREAST LUMPECTOMY Left   . DILATION AND CURETTAGE OF UTERUS     last done 2years ago  . PORT-A-CATH REMOVAL     12/2016   . PORTACATH PLACEMENT N/A 04/15/2016   Procedure: INSERTION PORT-A-CATH WITH [place];  Surgeon: Alphonsa Overall, MD;  [facility];  Service: General;  Laterality: N/A;  . RADIOACTIVE SEED GUIDED AXILLARY SENTINEL LYMPH NODE Left 10/24/2016   Procedure: RADIOACTIVE SEED GUIDED AXILLARY TARGETED SENTINEL LYMPH NODE DISSECTION;  Surgeon: Alphonsa Overall, MD;  Location: [place];  Service: General;  Laterality: Left;  .  RADIOACTIVE SEED GUIDED EXCISIONAL BREAST BIOPSY Bilateral 10/24/2016   Procedure: RIGHT BREAST RADIOACTIVE SEED GUIDED EXCISIONAL BREAST BIOPSIES X'S 2, LEFT BREAST RADIOACTIVE SEED GUIDED EXCISIONAL BIOPSY X2;  Surgeon: Alphonsa Overall, MD;  Location: [place];  Service: General;  Laterality: Bilateral;  . ROBOT ASSISTED PYELOPLASTY Left 03/05/2017   Procedure: XI ROBOTIC ASSISTED PYELOPLASTY;  Surgeon: Ardis Hughs, MD;  [facility];  Service: Urology;  Laterality: Left;  . TUBAL LIGATION  1996      Medications:  Outpatient Encounter Medications as of 11/14/2017  Medication Sig  . acetaminophen (TYLENOL) 500 MG tablet Take 1,000 mg by mouth every 6 (six) hours as needed (for pain.).   Marland Kitchen diclofenac sodium (VOLTAREN) 1 % GEL Apply 2 g topically 2 times daily at 12 noon and 4 pm.  . DULoxetine (CYMBALTA) 30 MG capsule Take 1 capsule (30 mg total) by mouth daily. After one week increase to 60 mg  . ferrous sulfate 325 (65 FE) MG EC tablet Take 325 mg by mouth every other day. In the morning  . gabapentin (NEURONTIN) 300 MG capsule Take 1 capsule (300 mg total) by mouth 2 (two) times daily.   No facility-administered encounter medications on file as of 11/14/2017.      Allergies: No Known Allergies  Family History: Family History  Problem Relation Age of Onset  . Hypertension Mother   . Diabetes Mother   . Breast cancer Paternal Grandmother 97       d.62 bilateral breast cancer  . Other Father        Homicide    Social History: Social History   Tobacco Use  . Smoking status: Never Smoker  . Smokeless tobacco: Never Used  Substance Use Topics  . Alcohol use: Yes    Comment: wine/social  . Drug use: No   Social History   Social History Narrative   Lives with fiancee in a one story home.     Works as a Freight forwarder for [employer].     Education: 2 years of college.     Review of Systems:  CONSTITUTIONAL: No fevers, chills, night sweats, or weight loss.   EYES: No visual changes or eye pain ENT: No hearing changes.  No history of nose bleeds.   RESPIRATORY: No cough, wheezing and shortness of breath.   CARDIOVASCULAR: Negative for chest pain, and palpitations.   GI: Negative for abdominal discomfort, blood in stools or black stools.  No recent change in bowel habits.   GU:  No history of incontinence.   MUSCLOSKELETAL: +history of joint pain or swelling.  No myalgias.   SKIN: Negative for lesions, rash, and itching.   HEMATOLOGY/ONCOLOGY: Negative for prolonged  bleeding, bruising easily, and swollen nodes.  No history of cancer.   ENDOCRINE: Negative for cold or heat intolerance, polydipsia or goiter.   PSYCH:  No depression or anxiety symptoms.   NEURO: As Above.   Vital Signs:  BP 120/84   Pulse 69   Ht 5' 6"  (1.676 m)   Wt 229 lb (103.9 kg)   SpO2 94%   BMI 36.96 kg/m    General Medical Exam:   General:  Well appearing, comfortable.   Eyes/ENT: see cranial nerve examination.   Neck: No carotid bruits. Respiratory:  Clear to auscultation, good air entry bilaterally.   Cardiac:  Regular rate and rhythm, no murmur.   Extremities:  No deformities, edema, or skin discoloration.  Skin:  No rashes or lesions.  Neurological  Exam: MENTAL STATUS including orientation to time, place, person, recent and remote memory, attention span and concentration, language, and fund of knowledge is normal.  Speech is not dysarthric.  CRANIAL NERVES: II:  No visual field defects.  Unremarkable fundi.   III-IV-VI: Pupils equal round and reactive to light.  Normal conjugate, extra-ocular eye movements in all directions of gaze.  No nystagmus.  No ptosis.   V:  Normal facial sensation.    VII:  Normal facial symmetry and movements.  No pathologic facial reflexes.  VIII:  Normal hearing and vestibular function.   IX-X:  Normal palatal movement.   XI:  Normal shoulder shrug and head rotation.   XII:  Normal tongue strength and range of motion, no deviation or fasciculation.  MOTOR:  No atrophy, fasciculations or abnormal movements.  No pronator drift.  Tone is normal.    Right Upper Extremity:    Left Upper Extremity:    Deltoid  5/5   Deltoid  5/5   Biceps  5/5   Biceps  5/5   Triceps  5/5   Triceps  5/5   Wrist extensors  5/5   Wrist extensors  5/5   Wrist flexors  5/5   Wrist flexors  5/5   Finger extensors  5/5   Finger extensors  5/5   Finger flexors  5/5   Finger flexors  5/5   Dorsal interossei  5/5   Dorsal interossei  5/5   Abductor pollicis   5/5   Abductor pollicis  5/5   Tone (Ashworth scale)  0  Tone (Ashworth scale)  0   Right Lower Extremity:    Left Lower Extremity:    Hip flexors  5/5   Hip flexors  5/5   Hip extensors  5/5   Hip extensors  5/5   Knee flexors  5/5   Knee flexors  5/5   Knee extensors  5/5   Knee extensors  5/5   Dorsiflexors  5/5   Dorsiflexors  5/5   Plantarflexors  5/5   Plantarflexors  5/5   Toe extensors  5/5   Toe extensors  5/5   Toe flexors  5/5   Toe flexors  5/5   Tone (Ashworth scale)  0  Tone (Ashworth scale)  0   MSRs:  Right                                                                 Left brachioradialis 2+  brachioradialis 2+  biceps 2+  biceps 2+  triceps 2+  triceps 2+  patellar 2+  patellar 2+  ankle jerk 2+  ankle jerk 2+  Hoffman no  Hoffman no  plantar response down  plantar response down   SENSORY:  Normal and symmetric perception of light touch, pinprick, vibration, and proprioception.  Romberg's sign absent.   COORDINATION/GAIT: Normal finger-to- nose-finger. Intact rapid alternating movements bilaterally.  Gait narrow based and stable. Tandem and heel walking intact.  She has antalgia with toe walking.   IMPRESSION: 1.  Right great and middle toe numbness.  With normal exam and symptoms occurring late after cessation of chemotherapy, likelihood of chemotherapy induced neuropathy is low.  Further, her numbness is isolated to only the first two toes, which is  more suggestive of Morton's neuroma.  I will check NCS/EMG of the right > left leg to assess for neuropathy, however, if this is normal, I'll refer to podiatry for further evaluation.  I also suggested that she start vitamin B12 1035mg as her levels are low-normal and this can be helpful for paresthesias.   2.  Bilateral ankle pain is more characteristic of arthritic pain, as she describes achy pain and stiffness.  NSAIDs recommended and she was asked to follow-up with her PCP for ongoing management.   Thank you for  allowing me to participate in patient's care.  If I can answer any additional questions, I would be pleased to do so.    Sincerely,    Yvana Samonte K. PPosey Pronto DO

## 2017-12-02 ENCOUNTER — Ambulatory Visit
Admission: RE | Admit: 2017-12-02 | Discharge: 2017-12-02 | Disposition: A | Discharge status: Home / Self Care | Payer: Medicaid Other | Source: Ambulatory Visit | Attending: Adult Health | Admitting: Adult Health

## 2017-12-02 DIAGNOSIS — C50312 Malignant neoplasm of lower-inner quadrant of left female breast: Secondary | ICD-10-CM

## 2017-12-02 DIAGNOSIS — Z171 Estrogen receptor negative status [ER-]: Principal | ICD-10-CM

## 2017-12-02 MED ORDER — GADOBENATE DIMEGLUMINE 529 MG/ML IV SOLN
20.0000 mL | Freq: Once | INTRAVENOUS | Status: AC | PRN
  Administered 2017-12-02: 20 mL via INTRAVENOUS

## 2017-12-04 ENCOUNTER — Telehealth: Payer: Self-pay

## 2017-12-04 NOTE — Telephone Encounter (Signed)
-----   Message from Gardenia Phlegm, NP sent at 12/04/2017  7:42 AM EDT ----- MRI normal, pleaes notify patient ----- Message ----- From: Interface, Rad Results In Sent: 12/02/2017   3:04 PM EDT To: Gardenia Phlegm, NP

## 2017-12-04 NOTE — Telephone Encounter (Signed)
Spoke with patient informing of normal MRI results.  She voiced understanding and had no questions at this time.

## 2017-12-11 ENCOUNTER — Ambulatory Visit (INDEPENDENT_AMBULATORY_CARE_PROVIDER_SITE_OTHER): Payer: Medicaid Other | Admitting: Neurology

## 2017-12-11 ENCOUNTER — Other Ambulatory Visit: Payer: Self-pay | Admitting: *Deleted

## 2017-12-11 DIAGNOSIS — M25572 Pain in left ankle and joints of left foot: Secondary | ICD-10-CM

## 2017-12-11 DIAGNOSIS — R202 Paresthesia of skin: Secondary | ICD-10-CM

## 2017-12-11 DIAGNOSIS — R200 Anesthesia of skin: Secondary | ICD-10-CM

## 2017-12-11 DIAGNOSIS — M25571 Pain in right ankle and joints of right foot: Secondary | ICD-10-CM | POA: Diagnosis not present

## 2017-12-11 DIAGNOSIS — G8929 Other chronic pain: Secondary | ICD-10-CM

## 2017-12-11 DIAGNOSIS — D3613 Benign neoplasm of peripheral nerves and autonomic nervous system of lower limb, including hip: Secondary | ICD-10-CM

## 2017-12-11 DIAGNOSIS — G5761 Lesion of plantar nerve, right lower limb: Secondary | ICD-10-CM

## 2017-12-11 NOTE — Procedures (Signed)
[facility]  [place], [place]  [place], [place] [address] Tel: [phone] Fax:  [phone] Test Date:  12/11/2017  Patient: Susan Morrison DOB: [birth year] Physician: Narda Amber, DO  Sex: Female Height: 5\' 6"  Ref Phys: Narda Amber, DO  ID#: [id number] Temp: 35.0C Technician:    Patient Complaints: This is a 47 year old female with history of chemotherapy referred for evaluation of bilateral toe paresthesias and ankle pain.  NCV & EMG Findings: Extensive electrodiagnostic testing of the right lower extremity and additional studies of the left shows:  1. Bilateral sural and superficial peroneal sensory responses are within normal limits. 2. Bilateral peroneal and tibial motor responses are within normal limits. 3. Bilateral tibial H reflex studies are within normal limits. 4. There is no evidence of active or chronic motor axonal loss changes affecting any of the tested muscles.  Motor unit configuration and recruitment pattern is within normal limits.  Impression: This is a normal study of the lower extremities.  In particular, there is no evidence of a large fiber sensorimotor polyneuropathy or lumbosacral radiculopathy.   ___________________________ Narda Amber, DO    Nerve Conduction Studies Anti Sensory Summary Table   Site NR Peak (ms) Norm Peak (ms) P-T Amp (V) Norm P-T Amp  Left Sup Peroneal Anti Sensory (Ant Lat Mall)  35C  12 cm    2.5 <4.5 11.7 >5  Right Sup Peroneal Anti Sensory (Ant Lat Mall)  35C  12 cm    2.6 <4.5 9.1 >5  Left Sural Anti Sensory (Lat Mall)  35C  Calf    3.2 <4.5 17.2 >5  Right Sural Anti Sensory (Lat Mall)  35C  Calf    2.6 <4.5 19.9 >5   Motor Summary Table   Site NR Onset (ms) Norm Onset (ms) O-P Amp (mV) Norm O-P Amp Site1 Site2 Delta-0 (ms) Dist (cm) Vel (m/s) Norm Vel (m/s)  Left Peroneal Motor (Ext Dig Brev)  35C  Ankle    3.5 <5.5 4.7 >3 B Fib Ankle 7.6 39.0 51 >40  B Fib    11.1  4.3  Poplt B Fib  1.2 9.0 75 >40  Poplt    12.3  4.3         Right Peroneal Motor (Ext Dig Brev)  35C  Ankle    3.2 <5.5 3.7 >3 B Fib Ankle 7.6 39.0 51 >40  B Fib    10.8  3.2  Poplt B Fib 0.9 8.0 89 >40  Poplt    11.7  3.1         Left Tibial Motor (Abd Hall Brev)  35C  Ankle    3.8 <6.0 9.1 >8 Knee Ankle 8.1 42.0 52 >40  Knee    11.9  6.9         Right Tibial Motor (Abd Hall Brev)  35C  Ankle    4.6 <6.0 8.3 >8 Knee Ankle 8.8 38.0 43 >40  Knee    13.4  6.1          H Reflex Studies   NR H-Lat (ms) Lat Norm (ms) L-R H-Lat (ms)  Left Tibial (Gastroc)  35C     32.24 <35 0.00  Right Tibial (Gastroc)  35C     32.24 <35 0.00   EMG   Side Muscle Ins Act Fibs Psw Fasc Number Recrt Dur Dur. Amp Amp. Poly Poly. Comment  Right AntTibialis Nml Nml Nml Nml Nml Nml Nml Nml Nml Nml Nml Nml N/A  Right  Gastroc Nml Nml Nml Nml Nml Nml Nml Nml Nml Nml Nml Nml N/A  Right Flex Dig Long Nml Nml Nml Nml Nml Nml Nml Nml Nml Nml Nml Nml N/A  Right RectFemoris Nml Nml Nml Nml Nml Nml Nml Nml Nml Nml Nml Nml N/A  Right BicepsFemS Nml Nml Nml Nml Nml Nml Nml Nml Nml Nml Nml Nml N/A  Left BicepsFemS Nml Nml Nml Nml Nml Nml Nml Nml Nml Nml Nml Nml N/A  Left AntTibialis Nml Nml Nml Nml Nml Nml Nml Nml Nml Nml Nml Nml N/A  Left Gastroc Nml Nml Nml Nml Nml Nml Nml Nml Nml Nml Nml Nml N/A  Left Flex Dig Long Nml Nml Nml Nml Nml Nml Nml Nml Nml Nml Nml Nml N/A  Left RectFemoris Nml Nml Nml Nml Nml Nml Nml Nml Nml Nml Nml Nml N/A      Waveforms:

## 2017-12-11 NOTE — Progress Notes (Signed)
Referral sent 

## 2017-12-11 NOTE — Progress Notes (Signed)
    Follow-up Visit   Date: 12/11/17    CAMDYN LADEN MRN: [id number] DOB: [birth year]   Interim History: Susan Morrison is a 47 y.o. right-handed African American female with left breast cancer s/p lumpectomy, radiation and chemotherapy (2018) returning to the clinic for EDX for right toe numbness.  The patient was accompanied to the clinic by self.  History of present illness: She was diagnosed with breast cancer in early March 2018 and started chemotherapy with adriamycin, cytoxan, and taxol.  She completed chemotherapy in July and by November, she began having swelling of the feet, along with numbness of the toes.  The numbness of the toes improved over a few months, only occurs intermittently now.  She continues to have numbness and tingling of the right great and middle toe, which is constant. She does not have similar symptoms on the left.  She saw her PCP for these symptoms and had labs check including TSH, HbA1c, folate, and vitamin B12 which were normal (see below).  She also complains of achiness of the ankles which is worse with walking and standing.  She does not have back pain, weakness, or imbalance.  She takes tylenol which eases some of the pain.  She is here for NCS/EMG of the legs which is normal.  Specifically, no evidence of neuropathy.   EXAM:  Deferred   DATA:   NCS/EMG of the legs 12/11/2017:  This is a normal study of the lower extremities.  In particular, there is no evidence of a large fiber sensorimotor polyneuropathy or lumbosacral radiculopathy.  IMPRESSION/PLAN: Right toe numbness, most suggestive of Morton's neuroma.  The results of her NCS/EMG was discussed which is entirely normal and does not show evidence of neuropathy.  She was reassured that there is no findings to support chemotherapy-induced neuropathy.  She will be referred to podiatry for further evaluation.   Thank you for allowing me to participate in patient's care.  If I can answer any  additional questions, I would be pleased to do so.    Sincerely,    Khylei Wilms K. Posey Pronto, DO

## 2017-12-12 ENCOUNTER — Encounter

## 2017-12-12 ENCOUNTER — Ambulatory Visit: Payer: Medicaid Other | Admitting: Neurology

## 2017-12-23 ENCOUNTER — Encounter: Payer: Self-pay | Admitting: Nurse Practitioner

## 2017-12-23 DIAGNOSIS — G629 Polyneuropathy, unspecified: Secondary | ICD-10-CM

## 2017-12-23 MED ORDER — DICLOFENAC SODIUM 1 % TD GEL: 2.0000 g | Freq: Two times a day (BID) | TRANSDERMAL | 1 refills | Status: DC

## 2017-12-24 ENCOUNTER — Telehealth: Payer: Self-pay | Admitting: Nurse Practitioner

## 2017-12-24 NOTE — Telephone Encounter (Signed)
Got a message from [facility] stating Voltaren gel discontinued by manufacturer.   Susan Morrison stated no alternative to this med, but she can try otc: Biofreeze, Bengay, asper cream and lidocaine patch.   Pt verbalized understand, pt stated she seeing her specialist tomorrow---she will consult about this med with them.

## 2017-12-25 ENCOUNTER — Ambulatory Visit (INDEPENDENT_AMBULATORY_CARE_PROVIDER_SITE_OTHER): Payer: Medicaid Other

## 2017-12-25 ENCOUNTER — Ambulatory Visit: Payer: Medicaid Other | Admitting: Podiatry

## 2017-12-25 DIAGNOSIS — D3610 Benign neoplasm of peripheral nerves and autonomic nervous system, unspecified: Secondary | ICD-10-CM

## 2017-12-25 DIAGNOSIS — G629 Polyneuropathy, unspecified: Secondary | ICD-10-CM | POA: Diagnosis not present

## 2017-12-25 DIAGNOSIS — M25571 Pain in right ankle and joints of right foot: Secondary | ICD-10-CM

## 2017-12-25 DIAGNOSIS — G8929 Other chronic pain: Secondary | ICD-10-CM

## 2017-12-25 MED ORDER — METHYLPREDNISOLONE 4 MG PO TBPK: ORAL_TABLET | ORAL | 0 refills | Status: DC

## 2017-12-25 NOTE — Progress Notes (Signed)
Subjective:   Patient ID: Susan Morrison, female   DOB: 47 y.o.   MRN: [id number]   HPI 47 year old female presents the office today for concerns of right ankle pain which is been ongoing since December 2018.  She says that she gets some swelling and pain to the right foot and ankle she describes a throbbing sensation mostly at nighttime after being on her feet.  She did see a specialist for concern for neuropathy but she was told she did not have symptoms associated neuropathy.  The pain did start after she underwent breast cancer treatment.  She denies any recent injury or trauma to her feet.  She denies any redness or warmth.  She had no recent treatment otherwise.  She has no other concerns.   Review of Systems  All other systems reviewed and are negative.  Past Medical History:  Diagnosis Date  . Anemia   . Arthritis   . Breast mass 03/21/2016   left 7:00  . Cancer ([place])    left brest cancer   . Genetic testing 06/03/2016   Susan Morrison underwent genetic counseling and testing for hereditary cancer syndromes on 05/15/2016. Her results were negative for mutations in all 46 genes analyzed by [employer]'s 46-gene Common Hereditary Cancers Panel. Genes analyzed include: APC, ATM, AXIN2, BARD1, BMPR1A, BRCA1, BRCA2, BRIP1, CDH1, CDKN2A, CHEK2, CTNNA1, DICER1, EPCAM, GREM1, HOXB13, KIT, MEN1, MLH1, MSH2, MSH3, MSH6, MUTYH, NBN,  . History of blood transfusion   . History of kidney stones   . History of radiation therapy 12/04/16-01/14/17   left breast 50.4 Gy in 28 fractions  . Personal history of chemotherapy    March-June 2018  . Personal history of radiation therapy    Oct-Dec 2018  . PONV (postoperative nausea and vomiting)     Past Surgical History:  Procedure Laterality Date  . BREAST LUMPECTOMY Left   . DILATION AND CURETTAGE OF UTERUS     last done 2years ago  . PORT-A-CATH REMOVAL     12/2016   . PORTACATH PLACEMENT N/A 04/15/2016   Procedure: INSERTION PORT-A-CATH WITH [place];   Surgeon: Alphonsa Overall, MD;  Location: [facility];  Service: General;  Laterality: N/A;  . RADIOACTIVE SEED GUIDED AXILLARY SENTINEL LYMPH NODE Left 10/24/2016   Procedure: RADIOACTIVE SEED GUIDED AXILLARY TARGETED SENTINEL LYMPH NODE DISSECTION;  Surgeon: Alphonsa Overall, MD;  Location: [facility];  Service: General;  Laterality: Left;  . RADIOACTIVE SEED GUIDED EXCISIONAL BREAST BIOPSY Bilateral 10/24/2016   Procedure: RIGHT BREAST RADIOACTIVE SEED GUIDED EXCISIONAL BREAST BIOPSIES X'S 2, LEFT BREAST RADIOACTIVE SEED GUIDED EXCISIONAL BIOPSY X2;  Surgeon: Alphonsa Overall, MD;  Location: [facility];  Service: General;  Laterality: Bilateral;  . ROBOT ASSISTED PYELOPLASTY Left 03/05/2017   Procedure: XI ROBOTIC ASSISTED PYELOPLASTY;  Surgeon: Ardis Hughs, MD;  Location: [facility];  Service: Urology;  Laterality: Left;  . TUBAL LIGATION  1996     Current Outpatient Medications:  .  acetaminophen (TYLENOL) 500 MG tablet, Take 1,000 mg by mouth every 6 (six) hours as needed (for pain.). , Disp: , Rfl:  .  diclofenac sodium (VOLTAREN) 1 % GEL, Apply 2 g topically 2 times daily at 12 noon and 4 pm., Disp: 100 g, Rfl: 1 .  DULoxetine (CYMBALTA) 30 MG capsule, Take 1 capsule (30 mg total) by mouth daily. After one week increase to 60 mg, Disp: 60 capsule, Rfl: 3 .  ferrous sulfate 325 (65 FE) MG EC tablet, Take 325 mg  by mouth every other day. In the morning, Disp: , Rfl:  .  gabapentin (NEURONTIN) 300 MG capsule, Take 1 capsule (300 mg total) by mouth 2 (two) times daily., Disp: 60 capsule, Rfl: 6 .  methylPREDNISolone (MEDROL DOSEPAK) 4 MG TBPK tablet, Take as directed, Disp: 21 tablet, Rfl: 0  No Known Allergies       Objective:  Physical Exam  General: AAO x3, NAD  Dermatological: Skin is warm, dry and supple bilateral. Nails x 10 are well manicured; remaining integument appears unremarkable at this time. There are no open sores, no preulcerative lesions, no rash or  signs of infection present.  Vascular: Dorsalis Pedis artery and Posterior Tibial artery pedal pulses are 2/4 bilateral with immedate capillary fill time. Pedal hair growth present. No varicosities and no lower extremity edema present bilateral. There is no pain with calf compression, swelling, warmth, erythema.   Neruologic: Grossly intact via light touch bilateral.  Protective threshold with Semmes Wienstein monofilament intact to all pedal sites bilateral.   Musculoskeletal: Mild tenderness palpation diffusely on the dorsal right midfoot with minimal localized edema but there is no erythema or warmth.  Tenderness along the course the peroneal tendon posterior and inferior to lateral malleolus but there is no area pinpoint tenderness on the fibula.  There is no pain to vibratory sensation of the fibula either.  There is no erythema or warmth and only minimal edema.  She does have decreased strength in the right side compared to contralateral extremity although mild decrease.  She does have mild decrease in muscle strength in the right side compared to contralateral extremity.  Significant flatfoot is present.  Gait: Unassisted, Nonantalgic.       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  Spurring off the medial malleolus.  There is small releases in the distal fibula on the AP view but not able to visualize any other x-rays.  No evidence of acute fracture otherwise.  Flatfoot is present. -Clinically there is no pain tracking of the fibula is no pain to vibratory sensation.  I put her into a Tri-Lock ankle brace today.  I prescribed a Medrol Dosepak.  As she starts to feel better she can start some rehab exercises.  Long-term will need to do orthotics.  We will see her  back in 3 weeks or sooner if any issues are to arise.  Trula Slade DPM

## 2017-12-25 NOTE — Patient Instructions (Signed)
Peroneal Tendinopathy Rehab  Ask your health care provider which exercises are safe for you. Do exercises exactly as told by your health care provider and adjust them as directed. It is normal to feel mild stretching, pulling, tightness, or discomfort as you do these exercises, but you should stop right away if you feel sudden pain or your pain gets worse.Do not begin these exercises until told by your health care provider.  Stretching and range of motion exercises  These exercises warm up your muscles and joints and improve the movement and flexibility of your ankle. These exercises also help to relieve pain and stiffness.  Exercise A: Gastroc and soleus, standing  1. Stand on the edge of a step on the balls of your feet. The ball of your foot is on the walking surface, right under your toes.  2. Hold onto the railing for balance.  3. Slowly lift your left / right foot, allowing your body weight to press your left / right heel down over the edge of the step. You should feel a stretch in your left / right calf.  4. Hold this position for __________ seconds.  Repeat __________ times with your left / right knee straight and __________ times with your left / right knee bent. Complete this stretch __________ times per day.  Strengthening exercises  These exercises improve the strength and endurance of your foot and ankle. Endurance is the ability to use your muscles for a long time, even after they get tired.  Exercise B: Dorsiflexors    1. Secure a rubber exercise band or tube to an object, like a table leg, that will not move if it is pulled on.  2. Secure the other end of the band around your left / right foot.  3. Sit on the floor, facing the object with your left / right foot extended. The band or tube should be slightly tense when your foot is relaxed.  4. Slowly flex your left / right ankle and toes to bring your foot toward you.  5. Hold this position for __________ seconds.  6. Slowly return your foot to the  starting position.  Repeat __________ times. Complete this exercise __________ times per day.  Exercise C: Evertors  1. Sit on the floor with your legs straight out in front of you.  2. Loop a rubber exercise or band or tube around the ball of your left / right foot. The ball of your foot is on the walking surface, right under your toes.  3. Hold the ends of the band in your hands, or secure the band to a stable object.  4. Slowly push your foot outward, away from your other leg.  5. Hold this position for __________ seconds.  6. Slowly return your foot to the starting position.  Repeat __________ times. Complete this exercise __________ times per day.  Exercise D: Standing heel raise (  plantar flexion)  1. Stand with your feet shoulder-width apart with the balls of your feet on a step. The ball of your foot is on the walking surface, right under your toes.  2. Keep your weight spread evenly over the width of your feet while you rise up on your toes. Use a wall or railing to steady yourself, but try not to use it for support.  3. If this exercise is too easy, try these options:  ? Shift your weight toward your left / right leg until you feel challenged.  ? If told by   your health care provider, stand on your left / right leg only.  4. Hold this position for __________ seconds.  Repeat __________ times. Complete this exercise __________ times per day.  Exercise E: Single leg stand  1. Without shoes, stand near a railing or in a doorway. You may hold onto the railing or door frame as needed.  2. Stand on your left / right foot. Keep your big toe down on the floor and try to keep your arch lifted.  ? Do not roll to the outside of your foot.  ? If this exercise is too easy, you can try it with your eyes closed or while standing on a pillow.  3. Hold this position for __________ seconds.  Repeat __________ times. Complete this exercise __________ times per day.  This information is not intended to replace advice given to  you by your health care provider. Make sure you discuss any questions you have with your health care provider.  Document Released: 01/21/2005 Document Revised: 09/28/2015 Document Reviewed: 12/10/2014  Elsevier Interactive Patient Education  2018 [employer]

## 2017-12-26 MED ORDER — DICLOFENAC SODIUM 1 % TD GEL: 2.0000 g | Freq: Two times a day (BID) | TRANSDERMAL | 1 refills | Status: DC

## 2017-12-26 NOTE — Addendum Note (Signed)
Addended by: Cranford Mon R on: 12/26/2017 10:43 AM   Modules accepted: Orders

## 2017-12-30 ENCOUNTER — Other Ambulatory Visit: Payer: Self-pay | Admitting: Podiatry

## 2017-12-30 DIAGNOSIS — M25571 Pain in right ankle and joints of right foot: Principal | ICD-10-CM

## 2017-12-30 DIAGNOSIS — G8929 Other chronic pain: Secondary | ICD-10-CM

## 2018-01-16 ENCOUNTER — Encounter: Payer: Self-pay | Admitting: Podiatry

## 2018-01-16 ENCOUNTER — Ambulatory Visit (INDEPENDENT_AMBULATORY_CARE_PROVIDER_SITE_OTHER): Payer: Medicaid Other

## 2018-01-16 ENCOUNTER — Ambulatory Visit: Payer: Medicaid Other | Admitting: Podiatry

## 2018-01-16 DIAGNOSIS — M779 Enthesopathy, unspecified: Secondary | ICD-10-CM

## 2018-01-16 DIAGNOSIS — S92401A Displaced unspecified fracture of right great toe, initial encounter for closed fracture: Secondary | ICD-10-CM

## 2018-01-16 DIAGNOSIS — S92411A Displaced fracture of proximal phalanx of right great toe, initial encounter for closed fracture: Secondary | ICD-10-CM

## 2018-01-16 DIAGNOSIS — M7751 Other enthesopathy of right foot: Secondary | ICD-10-CM | POA: Diagnosis not present

## 2018-01-16 DIAGNOSIS — T1490XA Injury, unspecified, initial encounter: Secondary | ICD-10-CM

## 2018-01-16 DIAGNOSIS — M7752 Other enthesopathy of left foot: Secondary | ICD-10-CM

## 2018-01-16 MED ORDER — TRAMADOL HCL 50 MG PO TABS: 50.0000 mg | ORAL_TABLET | Freq: Three times a day (TID) | ORAL | 0 refills | Status: DC | PRN

## 2018-01-21 DIAGNOSIS — S92401A Displaced unspecified fracture of right great toe, initial encounter for closed fracture: Secondary | ICD-10-CM | POA: Insufficient documentation

## 2018-01-21 NOTE — Progress Notes (Signed)
Subjective: 47 year old female presents the office today for concerns of pain to her right big toe.  She states that she fell down a step last Friday and since then she has been swelling to the toe and is been tender to put pressure on the toe.  She said no recent treatment for.  In regards to the pain I saw her for last month she is doing much better.  The brace has been helping quite a bit.  She has no new concerns otherwise. Denies any systemic complaints such as fevers, chills, nausea, vomiting. No acute changes since last appointment, and no other complaints at this time.   Objective: AAO x3, NAD DP/PT pulses palpable bilaterally, CRT less than 3 seconds There is tenderness of the right hallux and there is edema.  The majority of tenderness is to  the IPJ area.  There is no erythema or increase in warmth.  There is no subungual hematoma present there is no pain of the toenail the nail is firmly adhered.  Left side is been doing well there is no pain today. No open lesions or pre-ulcerative lesions.  No pain with calf compression, swelling, warmth, erythema  Assessment: 47 year old female right hallux for her  Plan: -All treatment options discussed with the patient including all alternatives, risks, complications.  -X-rays were obtained and reviewed.  There is a nondisplaced fracture of the distal phalanx. -Surgical shoe was dispensed.  Limit activity.  Continue ice and elevate.  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 -Patient encouraged to call the office with any questions, concerns, change in symptoms.   Return in about 4 weeks (around 02/13/2018). X-ray next appointment   Trula Slade DPM

## 2018-02-09 ENCOUNTER — Encounter: Payer: Self-pay | Admitting: Nurse Practitioner

## 2018-02-09 ENCOUNTER — Ambulatory Visit: Payer: Medicaid Other | Admitting: Nurse Practitioner

## 2018-02-09 VITALS — BP 142/90 | HR 60 | Temp 98.8°F | Ht 66.0 in | Wt 233.8 lb

## 2018-02-09 DIAGNOSIS — J00 Acute nasopharyngitis [common cold]: Secondary | ICD-10-CM | POA: Diagnosis not present

## 2018-02-09 MED ORDER — DM-GUAIFENESIN ER 30-600 MG PO TB12: 1.0000 | ORAL_TABLET | Freq: Two times a day (BID) | ORAL | 0 refills | Status: DC | PRN

## 2018-02-09 MED ORDER — HYDROCODONE-HOMATROPINE 5-1.5 MG/5ML PO SYRP: 5.0000 mL | ORAL_SOLUTION | Freq: Every evening | ORAL | 0 refills | Status: DC | PRN

## 2018-02-09 MED ORDER — CEFDINIR 300 MG PO CAPS: 300.0000 mg | ORAL_CAPSULE | Freq: Two times a day (BID) | ORAL | 0 refills | Status: DC

## 2018-02-09 MED ORDER — HYDROCODONE-HOMATROPINE 5-1.5 MG/5ML PO SYRP: 5.0000 mL | ORAL_SOLUTION | Freq: Two times a day (BID) | ORAL | 0 refills | Status: DC | PRN

## 2018-02-09 MED ORDER — ALBUTEROL SULFATE HFA 108 (90 BASE) MCG/ACT IN AERS: 1.0000 | INHALATION_SPRAY | Freq: Four times a day (QID) | RESPIRATORY_TRACT | 0 refills | Status: DC | PRN

## 2018-02-09 NOTE — Patient Instructions (Signed)
Acute Bronchitis, Adult Acute bronchitis is when air tubes (bronchi) in the lungs suddenly get swollen. The condition can make it hard to breathe. It can also cause these symptoms:  A cough.  Coughing up clear, yellow, or green mucus.  Wheezing.  Chest congestion.  Shortness of breath.  A fever.  Body aches.  Chills.  A sore throat. Follow these instructions at home:  Medicines  Take over-the-counter and prescription medicines only as told by your doctor.  If you were prescribed an antibiotic medicine, take it as told by your doctor. Do not stop taking the antibiotic even if you start to feel better. General instructions  Rest.  Drink enough fluids to keep your pee (urine) pale yellow.  Avoid smoking and secondhand smoke. If you smoke and you need help quitting, ask your doctor. Quitting will help your lungs heal faster.  Use an inhaler, cool mist vaporizer, or humidifier as told by your doctor.  Keep all follow-up visits as told by your doctor. This is important. How is this prevented? To lower your risk of getting this condition again:  Wash your hands often with soap and water. If you cannot use soap and water, use hand sanitizer.  Avoid contact with people who have cold symptoms.  Try not to touch your hands to your mouth, nose, or eyes.  Make sure to get the flu shot every year. Contact a doctor if:  Your symptoms do not get better in 2 weeks. Get help right away if:  You cough up blood.  You have chest pain.  You have very bad shortness of breath.  You become dehydrated.  You faint (pass out) or keep feeling like you are going to pass out.  You keep throwing up (vomiting).  You have a very bad headache.  Your fever or chills gets worse. This information is not intended to replace advice given to you by your health care provider. Make sure you discuss any questions you have with your health care provider. Document Released: 07/10/2007 Document  Revised: 09/04/2016 Document Reviewed: 07/12/2015 Elsevier Interactive Patient Education  2019 [employer] Inc.  

## 2018-02-09 NOTE — Progress Notes (Signed)
Subjective:  Patient ID: Susan Morrison, female    DOB: 19-Dec-1970  Age: 48 y.o. MRN: [id number]  CC: Cough (productive cough green mucus/ lasting 7 days./ OTC musinex/ did have fever, had a sore throat)  Cough  This is a new problem. The current episode started 1 to 4 weeks ago. The problem has been waxing and waning. The problem occurs constantly. The cough is productive of purulent sputum. Associated symptoms include chills, headaches, myalgias, nasal congestion, postnasal drip, rhinorrhea, a sore throat and shortness of breath. Pertinent negatives include no chest pain, ear congestion, ear pain, fever, heartburn, hemoptysis or wheezing. The symptoms are aggravated by lying down. She has tried OTC cough suppressant for the symptoms. The treatment provided no relief.   Reviewed past Medical, Social and Family history today.  Outpatient Medications Prior to Visit  Medication Sig Dispense Refill  . acetaminophen (TYLENOL) 500 MG tablet Take 1,000 mg by mouth every 6 (six) hours as needed (for pain.).     Marland Kitchen ferrous sulfate 325 (65 FE) MG EC tablet Take 325 mg by mouth every other day. In the morning    . diclofenac sodium (VOLTAREN) 1 % GEL Apply 2 g topically 2 times daily at 12 noon and 4 pm. 100 g 1  . methylPREDNISolone (MEDROL DOSEPAK) 4 MG TBPK tablet Take as directed 21 tablet 0  . traMADol (ULTRAM) 50 MG tablet Take 1 tablet (50 mg total) by mouth every 8 (eight) hours as needed. 15 tablet 0   No facility-administered medications prior to visit.     ROS See HPI  Objective:  BP (!) 142/90   Pulse 60   Temp 98.8 F (37.1 C) (Oral)   Ht 5\' 6"  (1.676 m)   Wt 233 lb 12.8 oz (106.1 kg)   SpO2 97%   BMI 37.74 kg/m   BP Readings from Last 3 Encounters:  02/09/18 (!) 142/90  11/14/17 120/84  10/30/17 129/87    Wt Readings from Last 3 Encounters:  02/09/18 233 lb 12.8 oz (106.1 kg)  11/14/17 229 lb (103.9 kg)  10/30/17 227 lb 11.2 oz (103.3 kg)    Physical Exam Vitals  signs reviewed.  HENT:     Right Ear: Tympanic membrane, ear canal and external ear normal.     Left Ear: Tympanic membrane, ear canal and external ear normal.     Nose:     Right Sinus: Maxillary sinus tenderness present.     Left Sinus: Maxillary sinus tenderness present.  Neck:     Musculoskeletal: Normal range of motion and neck supple.  Cardiovascular:     Rate and Rhythm: Normal rate and regular rhythm.     Pulses: Normal pulses.     Heart sounds: Normal heart sounds.  Pulmonary:     Effort: Pulmonary effort is normal.     Breath sounds: Normal breath sounds.  Musculoskeletal:     Right lower leg: No edema.     Left lower leg: No edema.  Neurological:     Mental Status: She is alert.     Lab Results  Component Value Date   WBC 5.1 09/11/2017   HGB 11.7 (L) 09/11/2017   HCT 35.7 (L) 09/11/2017   PLT 221.0 09/11/2017   GLUCOSE 98 09/11/2017   CHOL 101 03/05/2016   TRIG 101.0 03/05/2016   HDL 37.60 (L) 03/05/2016   LDLCALC 43 03/05/2016   ALT 34 09/11/2017   AST 29 09/11/2017   NA 140 09/11/2017   K  4.9 09/11/2017   CL 105 09/11/2017   CREATININE 0.66 09/11/2017   BUN 11 09/11/2017   CO2 24 09/11/2017   TSH 3.60 09/11/2017   HGBA1C 5.1 09/11/2017    Mr Breast Bilateral W Wo Contrast Inc Cad  Result Date: 12/02/2017 CLINICAL DATA:  History of LEFT breast cancer in February of 2018, status post lumpectomy in September of 2018 with chemotherapy and radiation therapy. Family history of breast cancer, including grandmother age 46. LABS:  Not applicable EXAM: BILATERAL BREAST MRI WITH AND WITHOUT CONTRAST TECHNIQUE: Multiplanar, multisequence MR images of both breasts were obtained prior to and following the intravenous administration of 20 ml of MultiHance. Three-dimensional MR images were rendered by post-processing of the original MR data on an independent workstation. The three-dimensional MR images were interpreted, and findings are reported in the following  complete MRI report for this study. Three dimensional images were evaluated at the independent DynaCad workstation COMPARISON:  Previous exams including breast MRI dated 08/27/2016. FINDINGS: Breast composition: c. Heterogeneous fibroglandular tissue. Background parenchymal enhancement: Mild Right breast: No suspicious enhancing mass, non-mass enhancement or secondary signs of malignancy. Left breast: Expected postsurgical changes. No suspicious enhancing mass, non-mass enhancement or secondary signs of malignancy. Lymph nodes: No abnormal appearing lymph nodes. Ancillary findings:  None. IMPRESSION: No evidence of malignancy within either breast. Expected postsurgical changes within the LEFT breast. RECOMMENDATION: 1. Continued annual diagnostic mammograms. Next bilateral diagnostic mammogram will be due in April 2020. 2. Given patient's family history of breast cancer, dense breasts and personal history of breast cancer, would consider the addition of annual screening breast MRI to annual screening mammography. Per American Cancer Society guidelines, annual screening MRI of the breasts is recommended if a risk assessment calculation for breast cancer, preferably using the Tyrer-Cuzick model, measures greater than 20%. BI-RADS CATEGORY  2: Benign. Electronically Signed   By: Franki Cabot M.D.   On: 12/02/2017 15:02    Assessment & Plan:   Jasime was seen today for cough.  Diagnoses and all orders for this visit:  Acute nasopharyngitis -     Discontinue: HYDROcodone-homatropine (HYCODAN) 5-1.5 MG/5ML syrup; Take 5 mLs by mouth at bedtime as needed for cough. -     albuterol (PROVENTIL HFA;VENTOLIN HFA) 108 (90 Base) MCG/ACT inhaler; Inhale 1-2 puffs into the lungs every 6 (six) hours as needed. -     cefdinir (OMNICEF) 300 MG capsule; Take 1 capsule (300 mg total) by mouth 2 (two) times daily. -     dextromethorphan-guaiFENesin (MUCINEX DM) 30-600 MG 12hr tablet; Take 1 tablet by mouth 2 (two) times  daily as needed for cough. -     HYDROcodone-homatropine (HYCODAN) 5-1.5 MG/5ML syrup; Take 5 mLs by mouth every 12 (twelve) hours as needed for cough.   I have discontinued Morley Kos. Kite's methylPREDNISolone, diclofenac sodium, and traMADol. I have also changed her HYDROcodone-homatropine. Additionally, I am having her start on albuterol, cefdinir, and dextromethorphan-guaiFENesin. Lastly, I am having her maintain her acetaminophen and ferrous sulfate.  Meds ordered this encounter  Medications  . DISCONTD: HYDROcodone-homatropine (HYCODAN) 5-1.5 MG/5ML syrup    Sig: Take 5 mLs by mouth at bedtime as needed for cough.    Dispense:  120 mL    Refill:  0    Order Specific Question:   Supervising Provider    Answer:   Lucille Passy [[id number]]  . albuterol (PROVENTIL HFA;VENTOLIN HFA) 108 (90 Base) MCG/ACT inhaler    Sig: Inhale 1-2 puffs into the lungs every 6 (  six) hours as needed.    Dispense:  1 Inhaler    Refill:  0    Order Specific Question:   Supervising Provider    Answer:   Lucille Passy [[id number]]  . cefdinir (OMNICEF) 300 MG capsule    Sig: Take 1 capsule (300 mg total) by mouth 2 (two) times daily.    Dispense:  14 capsule    Refill:  0    Order Specific Question:   Supervising Provider    Answer:   Lucille Passy [[id number]]  . dextromethorphan-guaiFENesin (MUCINEX DM) 30-600 MG 12hr tablet    Sig: Take 1 tablet by mouth 2 (two) times daily as needed for cough.    Dispense:  14 tablet    Refill:  0    Order Specific Question:   Supervising Provider    Answer:   Lucille Passy [[id number]]  . HYDROcodone-homatropine (HYCODAN) 5-1.5 MG/5ML syrup    Sig: Take 5 mLs by mouth every 12 (twelve) hours as needed for cough.    Dispense:  60 mL    Refill:  0    Order Specific Question:   Supervising Provider    Answer:   Lucille Passy [[id number]]    Problem List Items Addressed This Visit    None    Visit Diagnoses    Acute nasopharyngitis    -  Primary   Relevant Medications   albuterol  (PROVENTIL HFA;VENTOLIN HFA) 108 (90 Base) MCG/ACT inhaler   cefdinir (OMNICEF) 300 MG capsule   dextromethorphan-guaiFENesin (MUCINEX DM) 30-600 MG 12hr tablet   HYDROcodone-homatropine (HYCODAN) 5-1.5 MG/5ML syrup       Follow-up: No follow-ups on file.  Wilfred Lacy, NP

## 2018-02-13 ENCOUNTER — Ambulatory Visit: Payer: Medicaid Other | Admitting: Podiatry

## 2018-02-16 ENCOUNTER — Ambulatory Visit: Payer: Medicaid Other | Admitting: Podiatry

## 2018-02-16 ENCOUNTER — Ambulatory Visit (INDEPENDENT_AMBULATORY_CARE_PROVIDER_SITE_OTHER): Payer: Medicaid Other

## 2018-02-16 ENCOUNTER — Telehealth: Payer: Self-pay | Admitting: *Deleted

## 2018-02-16 DIAGNOSIS — S92401D Displaced unspecified fracture of right great toe, subsequent encounter for fracture with routine healing: Secondary | ICD-10-CM

## 2018-02-16 DIAGNOSIS — S92401A Displaced unspecified fracture of right great toe, initial encounter for closed fracture: Secondary | ICD-10-CM

## 2018-02-16 DIAGNOSIS — M76822 Posterior tibial tendinitis, left leg: Secondary | ICD-10-CM

## 2018-02-16 DIAGNOSIS — M779 Enthesopathy, unspecified: Secondary | ICD-10-CM | POA: Diagnosis not present

## 2018-02-16 DIAGNOSIS — M7751 Other enthesopathy of right foot: Secondary | ICD-10-CM | POA: Diagnosis not present

## 2018-02-16 DIAGNOSIS — T1490XA Injury, unspecified, initial encounter: Secondary | ICD-10-CM

## 2018-02-16 MED ORDER — DICLOFENAC SODIUM 1 % TD GEL: 2.0000 g | Freq: Four times a day (QID) | TRANSDERMAL | 2 refills | Status: DC

## 2018-02-16 NOTE — Telephone Encounter (Signed)
Unable to process pre-cert authorization through [facility], was sent to pending. Called [employer] - Medicaid, [place] requires clinicals prior to pre-cert.

## 2018-02-16 NOTE — Telephone Encounter (Signed)
-----   Message from Trula Slade, DPM sent at 02/16/2018  9:03 AM EST ----- Can you please order an MRI of her right ankle to rule out flexor tendon tear? Thanks.

## 2018-02-16 NOTE — Telephone Encounter (Signed)
Faxed orders to [facility]. 

## 2018-02-17 NOTE — Progress Notes (Signed)
Subjective: 48 year old female presents the office today for Evaluation of a fracture to her right big toe.  She has been wearing a surgical shoe and overall it has been feeling much better.  She has gone for short amount of time without the shoe and she states that it feels well.  Her concern today is that her ankle still hurts on the right side.  She been wearing ankle brace and she took the steroids when she is continue get pain and swelling to the area. Denies any systemic complaints such as fevers, chills, nausea, vomiting. No acute changes since last appointment, and no other complaints at this time.   Objective: AAO x3, NAD DP/PT pulses palpable bilaterally, CRT less than 3 seconds Minimal swelling to the right hallux there is no significant tenderness palpation.  There is no erythema or increase in warmth of the toenails firmly here to the nail bed.  There is tenderness palpation mostly on the posterior tibial tendon, flexor tendons just posterior and inferior to the medial malleolus on the left ankle.  There is edema to this area.  Clinically appears to be grossly intact however cannot rule out a partial tear given her prolonged nature of symptoms.  No other areas of tenderness.  Achilles tendon intact.  No pain to the lateral foot. No open lesions or pre-ulcerative lesions.  No pain with calf compression, swelling, warmth, erythema  Assessment: 48 year old female with posterior tibial tendinitis, rule out tear; healing fracture right hallux  Plan: -All treatment options discussed with the patient including all alternatives, risks, complications.  -X-rays were obtained and reviewed of the right foot.  Her a fracture to the distal phalanx appears to have increased consolidation.  As she is doing better reconstructive transition back into regular shoe. -Regards to the ankle she is attempted conservative treatment over the last several weeks with any significant improvement.  She has been  immobilized in a brace and she has taken medication was icing the insignificant improvement.  Because is on the order an MRI to out a partial tear.  She would like to have this done. -Patient encouraged to call the office with any questions, concerns, change in symptoms.   Susan Morrison DPM

## 2018-02-17 NOTE — Telephone Encounter (Signed)
Note is done.  

## 2018-02-18 NOTE — Telephone Encounter (Signed)
Faxed required cover sheet, clinicals to [facility] - Medicaid.

## 2018-02-22 ENCOUNTER — Other Ambulatory Visit: Payer: Medicaid Other

## 2018-02-23 NOTE — Patient Instructions (Addendum)
Your procedure is scheduled on  Thursday 03/05/2018   Report to [place]. M.   Call this number if you have problems the morning of surgery  :[phone].   OUR ADDRESS IS [address].  WE ARE LOCATED IN [facility].                                     REMEMBER:  DO NOT EAT FOOD AFTER MIDNIGHT .   NO SOLID FOOD AFTER MIDNIGHT THE NIGHT PRIOR TO SURGERY. NOTHING BY MOUTH EXCEPT CLEAR LIQUIDS UNTIL 3 HOURS PRIOR TO [place] SURGERY. PLEASE FINISH ENSURE DRINK PER SURGEON ORDER 3 HOURS PRIOR TO SCHEDULED SURGERY TIME WHICH NEEDS TO BE COMPLETED AT   0430 am.   CLEAR LIQUID DIET   Foods Allowed                                                                     Foods Excluded  Coffee and tea, regular and decaf                             liquids that you cannot  Plain Jell-O in any flavor                                             see through such as: Fruit ices (not with fruit pulp)                                     milk, soups, orange juice  Iced Popsicles                                    All solid food Carbonated beverages, regular and diet                                    Cranberry, grape and apple juices Sports drinks like Gatorade Lightly seasoned clear broth or consume(fat free) Sugar, honey syrup  Sample Menu Breakfast                                Lunch                                     Supper Cranberry juice                    Beef broth                            Chicken broth Jell-O  Grape juice                           Apple juice Coffee or tea                        Jell-O                                      Popsicle                                                Coffee or tea                        Coffee or tea  _____________________________________________________________________    TAKE THESE MEDICATIONS MORNING OF SURGERY WITH A SIP OF WATER: none   IF  YOU ARE SPENDING THE NIGHT AFTER SURGERY PLEASE BRING ALL YOUR PRESCRIPTION MEDICATIONS IN THEIR ORIGINAL BOTTLES.                                    DO NOT WEAR JEWERLY, MAKE UP, OR NAIL POLISH,  DO NOT WEAR LOTIONS, POWDERS, PERFUMES OR DEODORANT. DO NOT SHAVE FOR 24 HOURS PRIOR TO DAY OF SURGERY. MEN MAY SHAVE FACE AND NECK. CONTACTS, GLASSES, OR DENTURES MAY NOT BE WORN TO SURGERY.                                    [facility] IS NOT RESPONSIBLE  FOR ANY BELONGINGS.                                                                    Marland Kitchen                                                                                                    [facility] - Preparing for Surgery Before surgery, you can play an important role.  Because skin is not sterile, your skin needs to be as free of germs as possible.  You can reduce the number of germs on your skin by washing with CHG (chlorahexidine gluconate) soap before surgery.  CHG is an antiseptic cleaner which kills germs and bonds with the skin to continue killing germs even after washing. Please DO NOT use if you have an allergy to CHG or antibacterial soaps.  If your skin becomes reddened/irritated stop using the CHG and inform your nurse  when you arrive at Short Stay. Do not shave (including legs and underarms) for at least 48 hours prior to the first CHG shower.  You may shave your face/neck. Please follow these instructions carefully:  1.  Shower with CHG Soap the night before surgery and the  morning of Surgery.  2.  If you choose to wash your hair, wash your hair first as usual with your  normal  shampoo.  3.  After you shampoo, rinse your hair and body thoroughly to remove the  shampoo.                           4.  Use CHG as you would any other liquid soap.  You can apply chg directly  to the skin and wash                       Gently with a scrungie or clean washcloth.  5.  Apply the CHG Soap to your body ONLY FROM THE NECK DOWN.   Do not use on  face/ open                           Wound or open sores. Avoid contact with eyes, ears mouth and genitals (private parts).                       Wash face,  Genitals (private parts) with your normal soap.             6.  Wash thoroughly, paying special attention to the area where your surgery  will be performed.  7.  Thoroughly rinse your body with warm water from the neck down.  8.  DO NOT shower/wash with your normal soap after using and rinsing off  the CHG Soap.                9.  Pat yourself dry with a clean towel.            10.  Wear clean pajamas.            11.  Place clean sheets on your bed the night of your first shower and do not  sleep with pets. Day of Surgery : Do not apply any lotions/deodorants the morning of surgery.  Please wear clean clothes to the hospital/surgery center.  FAILURE TO FOLLOW THESE INSTRUCTIONS MAY RESULT IN THE CANCELLATION OF YOUR SURGERY PATIENT SIGNATURE_________________________________  NURSE SIGNATURE__________________________________  ________________________________________________________________________

## 2018-02-25 NOTE — H&P (Signed)
Susan Morrison is an 48 y.o. female here for total laparoscopic hysterectomy, bilateral salpingectomy and cystoscopy; possible open due to dysfunctional uterine bleeding. Pt has a history of  breast cancer - dx in 04/2016. She subsequently underwent a lumpectomy, chemotherapy and radiation. Pt reports no vaginal bleeding during treatment however begun having heavy vaginal bleeding, cramping and passage of large clots on September 02, 2017. This lasted until 09/16/2017. No bleeding since.  She had a workup including saline infused U/S in August that showed three intramural appearing fibroids largest approx 2cm and an intramural polyp. She also had an endometrial biopsy in 11/2017 that was normal. Pt is interested in definitive treatment   Pertinent Gynecological History: Menses: irregular Bleeding: dysfunctional uterine bleeding Contraception: tubal ligation DES exposure: denies Blood transfusions: none Sexually transmitted diseases: no past history Previous GYN Procedures: BTL  Last mammogram: normal Date: 04/2017 Last pap: normal Date: 09/2017 OB History: G3, P3003   Menstrual History: Menarche age: 82 No LMP recorded. (Menstrual status: Other).    Past Medical History:  Diagnosis Date  . Anemia   . Arthritis   . Breast mass 03/21/2016   left 7:00  . Cancer ([place])    left brest cancer   . Genetic testing 06/03/2016   Ms. Gowdy underwent genetic counseling and testing for hereditary cancer syndromes on 05/15/2016. Her results were negative for mutations in all 46 genes analyzed by [facility]'s 46-gene Common Hereditary Cancers Panel. Genes analyzed include: APC, ATM, AXIN2, BARD1, BMPR1A, BRCA1, BRCA2, BRIP1, CDH1, CDKN2A, CHEK2, CTNNA1, DICER1, EPCAM, GREM1, HOXB13, KIT, MEN1, MLH1, MSH2, MSH3, MSH6, MUTYH, NBN,  . History of blood transfusion   . History of kidney stones   . History of radiation therapy 12/04/16-01/14/17   left breast 50.4 Gy in 28 fractions  . Personal history of  chemotherapy    March-June 2018  . Personal history of radiation therapy    Oct-Dec 2018  . PONV (postoperative nausea and vomiting)     Past Surgical History:  Procedure Laterality Date  . BREAST LUMPECTOMY Left   . DILATION AND CURETTAGE OF UTERUS     last done 2years ago  . PORT-A-CATH REMOVAL     12/2016   . PORTACATH PLACEMENT N/A 04/15/2016   Procedure: INSERTION PORT-A-CATH WITH [place];  Surgeon: Alphonsa Overall, MD;  Location: [facility];  Service: General;  Laterality: N/A;  . RADIOACTIVE SEED GUIDED AXILLARY SENTINEL LYMPH NODE Left 10/24/2016   Procedure: RADIOACTIVE SEED GUIDED AXILLARY TARGETED SENTINEL LYMPH NODE DISSECTION;  Surgeon: Alphonsa Overall, MD;  Location: [facility];  Service: General;  Laterality: Left;  . RADIOACTIVE SEED GUIDED EXCISIONAL BREAST BIOPSY Bilateral 10/24/2016   Procedure: RIGHT BREAST RADIOACTIVE SEED GUIDED EXCISIONAL BREAST BIOPSIES X'S 2, LEFT BREAST RADIOACTIVE SEED GUIDED EXCISIONAL BIOPSY X2;  Surgeon: Alphonsa Overall, MD;  Location: [facility];  Service: General;  Laterality: Bilateral;  . ROBOT ASSISTED PYELOPLASTY Left 03/05/2017   Procedure: XI ROBOTIC ASSISTED PYELOPLASTY;  Surgeon: Ardis Hughs, MD;  Location: [facility];  Service: Urology;  Laterality: Left;  . TUBAL LIGATION  1996    Family History  Problem Relation Age of Onset  . Hypertension Mother   . Diabetes Mother   . Breast cancer Paternal Grandmother 74       d.62 bilateral breast cancer  . Other Father        Homicide    Social History:  reports that she has never smoked. She has never used smokeless tobacco. She reports  current alcohol use. She reports that she does not use drugs.  Allergies:  Allergies  Allergen Reactions  . Ibuprofen Other (See Comments)    Kidney issues     No medications prior to admission.    ROS  There were no vitals taken for this visit. Physical Exam  No results found for this or any previous visit (from the  past 24 hour(s)).  No results found.  Assessment/Plan: 07AH [id number] with history of breast cancer and subsequently DUB opting for definitive treatment  Consent verified/ procedure reviewed All questions answered To OR when ready for  total laparoscopic hysterectomy, bilateral salpingectomy and cystoscopy; possible open  [place] 02/25/2018, 11:14 AM

## 2018-02-26 ENCOUNTER — Encounter (HOSPITAL_COMMUNITY): Payer: Self-pay | Admitting: *Deleted

## 2018-02-26 ENCOUNTER — Other Ambulatory Visit: Payer: Self-pay

## 2018-02-26 ENCOUNTER — Encounter (HOSPITAL_COMMUNITY)
Admission: RE | Admit: 2018-02-26 | Discharge: 2018-02-26 | Disposition: A | Discharge status: Home / Self Care | Payer: Medicaid Other | Source: Ambulatory Visit | Attending: Obstetrics and Gynecology | Admitting: Obstetrics and Gynecology

## 2018-02-26 DIAGNOSIS — Z01812 Encounter for preprocedural laboratory examination: Secondary | ICD-10-CM | POA: Insufficient documentation

## 2018-02-26 DIAGNOSIS — N938 Other specified abnormal uterine and vaginal bleeding: Secondary | ICD-10-CM | POA: Diagnosis not present

## 2018-02-26 LAB — CBC
HCT: 41.5 % (ref 36.0–46.0)
Hemoglobin: 13.3 g/dL (ref 12.0–15.0)
MCH: 23.8 pg — ABNORMAL LOW (ref 26.0–34.0)
MCHC: 32 g/dL (ref 30.0–36.0)
MCV: 74.4 fL — ABNORMAL LOW (ref 80.0–100.0)
Platelets: 212 10*3/uL (ref 150–400)
RBC: 5.58 MIL/uL — ABNORMAL HIGH (ref 3.87–5.11)
RDW: 18 % — AB (ref 11.5–15.5)
WBC: 5 10*3/uL (ref 4.0–10.5)
nRBC: 0 % (ref 0.0–0.2)

## 2018-03-05 ENCOUNTER — Encounter (HOSPITAL_BASED_OUTPATIENT_CLINIC_OR_DEPARTMENT_OTHER): Payer: Self-pay | Admitting: *Deleted

## 2018-03-05 ENCOUNTER — Ambulatory Visit (HOSPITAL_BASED_OUTPATIENT_CLINIC_OR_DEPARTMENT_OTHER)
Admission: RE | Admit: 2018-03-05 | Discharge: 2018-03-06 | Disposition: A | Discharge status: Home / Self Care | Payer: Medicaid Other | Attending: Obstetrics and Gynecology | Admitting: Obstetrics and Gynecology

## 2018-03-05 ENCOUNTER — Ambulatory Visit (HOSPITAL_BASED_OUTPATIENT_CLINIC_OR_DEPARTMENT_OTHER): Payer: Medicaid Other | Admitting: Anesthesiology

## 2018-03-05 ENCOUNTER — Encounter (HOSPITAL_BASED_OUTPATIENT_CLINIC_OR_DEPARTMENT_OTHER)
Admission: RE | Disposition: A | Discharge status: Home / Self Care | Payer: Self-pay | Source: Home / Self Care | Attending: Obstetrics and Gynecology

## 2018-03-05 ENCOUNTER — Ambulatory Visit (HOSPITAL_BASED_OUTPATIENT_CLINIC_OR_DEPARTMENT_OTHER): Payer: Medicaid Other | Admitting: Physician Assistant

## 2018-03-05 DIAGNOSIS — N938 Other specified abnormal uterine and vaginal bleeding: Secondary | ICD-10-CM | POA: Diagnosis not present

## 2018-03-05 DIAGNOSIS — N888 Other specified noninflammatory disorders of cervix uteri: Secondary | ICD-10-CM | POA: Insufficient documentation

## 2018-03-05 DIAGNOSIS — N736 Female pelvic peritoneal adhesions (postinfective): Secondary | ICD-10-CM | POA: Diagnosis not present

## 2018-03-05 DIAGNOSIS — N838 Other noninflammatory disorders of ovary, fallopian tube and broad ligament: Secondary | ICD-10-CM | POA: Insufficient documentation

## 2018-03-05 DIAGNOSIS — D261 Other benign neoplasm of corpus uteri: Secondary | ICD-10-CM | POA: Diagnosis not present

## 2018-03-05 DIAGNOSIS — D259 Leiomyoma of uterus, unspecified: Secondary | ICD-10-CM | POA: Diagnosis not present

## 2018-03-05 DIAGNOSIS — Z90710 Acquired absence of both cervix and uterus: Secondary | ICD-10-CM | POA: Diagnosis present

## 2018-03-05 DIAGNOSIS — Z9221 Personal history of antineoplastic chemotherapy: Secondary | ICD-10-CM | POA: Diagnosis not present

## 2018-03-05 DIAGNOSIS — N840 Polyp of corpus uteri: Secondary | ICD-10-CM | POA: Insufficient documentation

## 2018-03-05 DIAGNOSIS — Z853 Personal history of malignant neoplasm of breast: Secondary | ICD-10-CM | POA: Diagnosis not present

## 2018-03-05 DIAGNOSIS — Z923 Personal history of irradiation: Secondary | ICD-10-CM | POA: Insufficient documentation

## 2018-03-05 HISTORY — PX: ABDOMINAL HYSTERECTOMY: SHX81

## 2018-03-05 HISTORY — PX: CYSTOSCOPY: SHX5120

## 2018-03-05 HISTORY — PX: TOTAL LAPAROSCOPIC HYSTERECTOMY WITH SALPINGECTOMY: SHX6742

## 2018-03-05 LAB — POCT PREGNANCY, URINE: Preg Test, Ur: NEGATIVE

## 2018-03-05 LAB — TYPE AND SCREEN
ABO/RH(D): A NEG
Antibody Screen: NEGATIVE

## 2018-03-05 SURGERY — HYSTERECTOMY, TOTAL, LAPAROSCOPIC, WITH SALPINGECTOMY
Anesthesia: General | Site: Bladder

## 2018-03-05 MED ORDER — OXYCODONE-ACETAMINOPHEN 5-325 MG PO TABS
1.0000 | ORAL_TABLET | ORAL | Status: DC | PRN
  Administered 2018-03-05: 2 via ORAL
  Administered 2018-03-05: 1 via ORAL
  Administered 2018-03-05: 2 via ORAL
  Administered 2018-03-06 (×2): 1 via ORAL
  Filled 2018-03-05: qty 2

## 2018-03-05 MED ORDER — BUPIVACAINE HCL (PF) 0.25 % IJ SOLN
INTRAMUSCULAR | Status: DC | PRN
  Administered 2018-03-05: 10 mL

## 2018-03-05 MED ORDER — ACETAMINOPHEN 10 MG/ML IV SOLN
INTRAVENOUS | Status: DC | PRN
  Administered 2018-03-05: 1000 mg via INTRAVENOUS

## 2018-03-05 MED ORDER — MENTHOL 3 MG MT LOZG
1.0000 | LOZENGE | OROMUCOSAL | Status: DC | PRN
  Filled 2018-03-05: qty 9

## 2018-03-05 MED ORDER — SUGAMMADEX SODIUM 200 MG/2ML IV SOLN
INTRAVENOUS | Status: AC
  Filled 2018-03-05: qty 2

## 2018-03-05 MED ORDER — OXYCODONE-ACETAMINOPHEN 5-325 MG PO TABS
ORAL_TABLET | ORAL | Status: AC
  Filled 2018-03-05: qty 1

## 2018-03-05 MED ORDER — GABAPENTIN 300 MG PO CAPS
900.0000 mg | ORAL_CAPSULE | ORAL | Status: AC
  Administered 2018-03-05: 900 mg via ORAL
  Filled 2018-03-05: qty 3

## 2018-03-05 MED ORDER — ARTIFICIAL TEARS OPHTHALMIC OINT
TOPICAL_OINTMENT | OPHTHALMIC | Status: AC
  Filled 2018-03-05: qty 3.5

## 2018-03-05 MED ORDER — PANTOPRAZOLE SODIUM 40 MG IV SOLR
INTRAVENOUS | Status: AC
  Filled 2018-03-05: qty 40

## 2018-03-05 MED ORDER — ONDANSETRON HCL 4 MG/2ML IJ SOLN
INTRAMUSCULAR | Status: AC
  Filled 2018-03-05: qty 2

## 2018-03-05 MED ORDER — TRAMADOL HCL 50 MG PO TABS
50.0000 mg | ORAL_TABLET | Freq: Four times a day (QID) | ORAL | Status: DC | PRN
  Administered 2018-03-05: 50 mg via ORAL
  Filled 2018-03-05: qty 1

## 2018-03-05 MED ORDER — LIDOCAINE 2% (20 MG/ML) 5 ML SYRINGE
INTRAMUSCULAR | Status: AC
  Filled 2018-03-05: qty 5

## 2018-03-05 MED ORDER — SIMETHICONE 80 MG PO CHEW
80.0000 mg | CHEWABLE_TABLET | Freq: Four times a day (QID) | ORAL | Status: DC | PRN
  Filled 2018-03-05: qty 1

## 2018-03-05 MED ORDER — SUGAMMADEX SODIUM 200 MG/2ML IV SOLN
INTRAVENOUS | Status: DC | PRN
  Administered 2018-03-05: 200 mg via INTRAVENOUS

## 2018-03-05 MED ORDER — ONDANSETRON HCL 4 MG PO TABS
4.0000 mg | ORAL_TABLET | Freq: Four times a day (QID) | ORAL | Status: DC | PRN
  Filled 2018-03-05: qty 1

## 2018-03-05 MED ORDER — OXYCODONE-ACETAMINOPHEN 5-325 MG PO TABS
ORAL_TABLET | ORAL | Status: AC
  Filled 2018-03-05: qty 2

## 2018-03-05 MED ORDER — ACETAMINOPHEN 10 MG/ML IV SOLN
INTRAVENOUS | Status: AC
  Filled 2018-03-05: qty 100

## 2018-03-05 MED ORDER — GABAPENTIN 100 MG PO CAPS
100.0000 mg | ORAL_CAPSULE | Freq: Two times a day (BID) | ORAL | Status: DC
  Administered 2018-03-05: 100 mg via ORAL
  Filled 2018-03-05 (×2): qty 1

## 2018-03-05 MED ORDER — PROPOFOL 10 MG/ML IV BOLUS
INTRAVENOUS | Status: DC | PRN
  Administered 2018-03-05: 150 mg via INTRAVENOUS

## 2018-03-05 MED ORDER — OXYCODONE HCL 5 MG/5ML PO SOLN
5.0000 mg | Freq: Once | ORAL | Status: DC | PRN
  Filled 2018-03-05: qty 5

## 2018-03-05 MED ORDER — PROPOFOL 10 MG/ML IV BOLUS
INTRAVENOUS | Status: AC
  Filled 2018-03-05: qty 40

## 2018-03-05 MED ORDER — SCOPOLAMINE 1 MG/3DAYS TD PT72
MEDICATED_PATCH | TRANSDERMAL | Status: AC
  Filled 2018-03-05: qty 1

## 2018-03-05 MED ORDER — FENTANYL CITRATE (PF) 100 MCG/2ML IJ SOLN
INTRAMUSCULAR | Status: AC
  Filled 2018-03-05: qty 2

## 2018-03-05 MED ORDER — SODIUM CHLORIDE 0.9 % IR SOLN
Status: DC | PRN
  Administered 2018-03-05: 1000 mL via INTRAVESICAL
  Administered 2018-03-05: 1000 mL

## 2018-03-05 MED ORDER — SODIUM CHLORIDE 0.9 % IV SOLN
2.0000 g | INTRAVENOUS | Status: AC
  Administered 2018-03-05: 2 g via INTRAVENOUS
  Filled 2018-03-05: qty 2

## 2018-03-05 MED ORDER — FENTANYL CITRATE (PF) 100 MCG/2ML IJ SOLN
INTRAMUSCULAR | Status: DC | PRN
  Administered 2018-03-05: 50 ug via INTRAVENOUS
  Administered 2018-03-05: 100 ug via INTRAVENOUS
  Administered 2018-03-05: 50 ug via INTRAVENOUS

## 2018-03-05 MED ORDER — MIDAZOLAM HCL 2 MG/2ML IJ SOLN
INTRAMUSCULAR | Status: AC
  Filled 2018-03-05: qty 2

## 2018-03-05 MED ORDER — ONDANSETRON HCL 4 MG/2ML IJ SOLN
INTRAMUSCULAR | Status: DC | PRN
  Administered 2018-03-05: 4 mg via INTRAVENOUS

## 2018-03-05 MED ORDER — FENTANYL CITRATE (PF) 250 MCG/5ML IJ SOLN
INTRAMUSCULAR | Status: AC
  Filled 2018-03-05: qty 5

## 2018-03-05 MED ORDER — GABAPENTIN 300 MG PO CAPS
ORAL_CAPSULE | ORAL | Status: AC
  Filled 2018-03-05: qty 1

## 2018-03-05 MED ORDER — FENTANYL CITRATE (PF) 100 MCG/2ML IJ SOLN
25.0000 ug | INTRAMUSCULAR | Status: DC | PRN
  Administered 2018-03-05 (×3): 50 ug via INTRAVENOUS
  Filled 2018-03-05: qty 1

## 2018-03-05 MED ORDER — FLUORESCEIN SODIUM 10 % IV SOLN
INTRAVENOUS | Status: DC | PRN
  Administered 2018-03-05: .5 mL via INTRAVENOUS

## 2018-03-05 MED ORDER — TRAMADOL HCL 50 MG PO TABS
ORAL_TABLET | ORAL | Status: AC
  Filled 2018-03-05: qty 1

## 2018-03-05 MED ORDER — DOCUSATE SODIUM 100 MG PO CAPS
ORAL_CAPSULE | ORAL | Status: AC
  Filled 2018-03-05: qty 1

## 2018-03-05 MED ORDER — LIDOCAINE 2% (20 MG/ML) 5 ML SYRINGE
INTRAMUSCULAR | Status: DC | PRN
  Administered 2018-03-05: 60 mg via INTRAVENOUS

## 2018-03-05 MED ORDER — ONDANSETRON HCL 4 MG/2ML IJ SOLN
4.0000 mg | Freq: Four times a day (QID) | INTRAMUSCULAR | Status: AC | PRN
  Administered 2018-03-05: 4 mg via INTRAVENOUS
  Filled 2018-03-05: qty 2

## 2018-03-05 MED ORDER — OXYCODONE HCL 5 MG PO TABS
5.0000 mg | ORAL_TABLET | Freq: Once | ORAL | Status: DC | PRN
  Filled 2018-03-05: qty 1

## 2018-03-05 MED ORDER — ONDANSETRON HCL 4 MG/2ML IJ SOLN
4.0000 mg | Freq: Four times a day (QID) | INTRAMUSCULAR | Status: DC | PRN
  Filled 2018-03-05: qty 2

## 2018-03-05 MED ORDER — ROCURONIUM BROMIDE 100 MG/10ML IV SOLN
INTRAVENOUS | Status: AC
  Filled 2018-03-05: qty 1

## 2018-03-05 MED ORDER — SCOPOLAMINE 1 MG/3DAYS TD PT72
1.0000 | MEDICATED_PATCH | TRANSDERMAL | Status: DC
  Administered 2018-03-05: 1.5 mg via TRANSDERMAL
  Filled 2018-03-05: qty 1

## 2018-03-05 MED ORDER — DEXAMETHASONE SODIUM PHOSPHATE 10 MG/ML IJ SOLN
INTRAMUSCULAR | Status: DC | PRN
  Administered 2018-03-05: 10 mg via INTRAVENOUS

## 2018-03-05 MED ORDER — PANTOPRAZOLE SODIUM 40 MG IV SOLR
40.0000 mg | Freq: Every day | INTRAVENOUS | Status: DC
  Administered 2018-03-05: 40 mg via INTRAVENOUS
  Filled 2018-03-05: qty 40

## 2018-03-05 MED ORDER — DOCUSATE SODIUM 100 MG PO CAPS
100.0000 mg | ORAL_CAPSULE | Freq: Two times a day (BID) | ORAL | Status: DC
  Administered 2018-03-05: 100 mg via ORAL
  Filled 2018-03-05: qty 1

## 2018-03-05 MED ORDER — SODIUM CHLORIDE 0.9 % IV SOLN
INTRAVENOUS | Status: AC
  Filled 2018-03-05: qty 2

## 2018-03-05 MED ORDER — LACTATED RINGERS IV SOLN
INTRAVENOUS | Status: DC
  Administered 2018-03-05 (×3): via INTRAVENOUS
  Filled 2018-03-05 (×2): qty 1000

## 2018-03-05 MED ORDER — DEXAMETHASONE SODIUM PHOSPHATE 10 MG/ML IJ SOLN
INTRAMUSCULAR | Status: AC
  Filled 2018-03-05: qty 1

## 2018-03-05 MED ORDER — MIDAZOLAM HCL 2 MG/2ML IJ SOLN
INTRAMUSCULAR | Status: DC | PRN
  Administered 2018-03-05: 2 mg via INTRAVENOUS

## 2018-03-05 MED ORDER — GABAPENTIN 300 MG PO CAPS
ORAL_CAPSULE | ORAL | Status: AC
  Filled 2018-03-05: qty 3

## 2018-03-05 MED ORDER — ROCURONIUM BROMIDE 10 MG/ML (PF) SYRINGE
PREFILLED_SYRINGE | INTRAVENOUS | Status: DC | PRN
  Administered 2018-03-05: 50 mg via INTRAVENOUS
  Administered 2018-03-05: 10 mg via INTRAVENOUS

## 2018-03-05 SURGICAL SUPPLY — 56 items
ADH SKN CLS APL DERMABOND .7 (GAUZE/BANDAGES/DRESSINGS) ×2
BANDAGE ADH SHEER 1  50/CT (GAUZE/BANDAGES/DRESSINGS) ×2 IMPLANT
BARRIER ADHS 3X4 INTERCEED (GAUZE/BANDAGES/DRESSINGS) IMPLANT
BRR ADH 4X3 ABS CNTRL BYND (GAUZE/BANDAGES/DRESSINGS)
CABLE HIGH FREQUENCY MONO STRZ (ELECTRODE) IMPLANT
CELL SAVER LIPIGURD (MISCELLANEOUS) IMPLANT
CLOTH BEACON ORANGE TIMEOUT ST (SAFETY) ×4 IMPLANT
COVER BACK TABLE 60X90IN (DRAPES) ×4 IMPLANT
COVER MAYO STAND STRL (DRAPES) ×4 IMPLANT
COVER SURGICAL LIGHT HANDLE (MISCELLANEOUS) ×4 IMPLANT
COVER WAND RF STERILE (DRAPES) ×6 IMPLANT
DERMABOND ADVANCED (GAUZE/BANDAGES/DRESSINGS) ×2
DERMABOND ADVANCED .7 DNX12 (GAUZE/BANDAGES/DRESSINGS) ×2 IMPLANT
DEVICE RETRIEVAL ALEXIS 14 (MISCELLANEOUS) IMPLANT
DEVICE SUTURE ENDOST 10MM (ENDOMECHANICALS) ×2 IMPLANT
DRSG OPSITE POSTOP 3X4 (GAUZE/BANDAGES/DRESSINGS) ×2 IMPLANT
DURAPREP 26ML APPLICATOR (WOUND CARE) ×4 IMPLANT
EXTRT SYSTEM ALEXIS 14CM (MISCELLANEOUS)
EXTRT SYSTEM ALEXIS 17CM (MISCELLANEOUS)
GAUZE 4X4 16PLY RFD (DISPOSABLE) ×4 IMPLANT
GLOVE BIO SURGEON STRL SZ 6.5 (GLOVE) ×6 IMPLANT
GLOVE BIO SURGEONS STRL SZ 6.5 (GLOVE) ×2
GOWN STRL REUS W/TWL LRG LVL3 (GOWN DISPOSABLE) ×8 IMPLANT
NS IRRIG 1000ML POUR BTL (IV SOLUTION) ×4 IMPLANT
OCCLUDER COLPOPNEUMO (BALLOONS) ×4 IMPLANT
PACK LAPAROSCOPY BASIN (CUSTOM PROCEDURE TRAY) ×4 IMPLANT
PACK TRENDGUARD 450 HYBRID PRO (MISCELLANEOUS) IMPLANT
SCISSORS LAP 5X35 DISP (ENDOMECHANICALS) IMPLANT
SET CYSTO W/LG BORE CLAMP LF (SET/KITS/TRAYS/PACK) ×2 IMPLANT
SET IRRIG TUBING LAPAROSCOPIC (IRRIGATION / IRRIGATOR) ×4 IMPLANT
SET IRRIG Y TYPE TUR BLADDER L (SET/KITS/TRAYS/PACK) IMPLANT
SET TRI-LUMEN FLTR TB AIRSEAL (TUBING) ×4 IMPLANT
SHEARS HARMONIC ACE PLUS 36CM (ENDOMECHANICALS) ×2 IMPLANT
SUT ENDO VLOC 180-0-8IN (SUTURE) ×2 IMPLANT
SUT PLAIN 2 0 XLH (SUTURE) IMPLANT
SUT VIC AB 0 CT1 27 (SUTURE) ×8
SUT VIC AB 0 CT1 27XBRD ANBCTR (SUTURE) ×4 IMPLANT
SUT VIC AB 2-0 CT1 (SUTURE) IMPLANT
SUT VIC AB 4-0 KS 27 (SUTURE) IMPLANT
SUT VIC AB 4-0 PS2 27 (SUTURE) ×4 IMPLANT
SUT VICRYL 0 UR6 27IN ABS (SUTURE) ×4 IMPLANT
SYR 10ML LL (SYRINGE) IMPLANT
SYR 50ML LL SCALE MARK (SYRINGE) ×4 IMPLANT
SYSTEM CARTER THOMASON II (TROCAR) ×4 IMPLANT
SYSTEM CONTND EXTRCTN KII BLLN (MISCELLANEOUS) IMPLANT
TIP UTERINE 5.1X6CM LAV DISP (MISCELLANEOUS) IMPLANT
TIP UTERINE 6.7X10CM GRN DISP (MISCELLANEOUS) IMPLANT
TIP UTERINE 6.7X6CM WHT DISP (MISCELLANEOUS) IMPLANT
TIP UTERINE 6.7X8CM BLUE DISP (MISCELLANEOUS) ×2 IMPLANT
TOWEL OR 17X24 6PK STRL BLUE (TOWEL DISPOSABLE) ×8 IMPLANT
TRAY FOLEY W/BAG SLVR 14FR (SET/KITS/TRAYS/PACK) ×4 IMPLANT
TRENDGUARD 450 HYBRID PRO PACK (MISCELLANEOUS) ×4
TROCAR BLADELESS OPT 5 100 (ENDOMECHANICALS) ×4 IMPLANT
TROCAR PORT AIRSEAL 5X120 (TROCAR) ×4 IMPLANT
TROCAR XCEL NON-BLD 11X100MML (ENDOMECHANICALS) ×4 IMPLANT
WARMER LAPAROSCOPE (MISCELLANEOUS) ×4 IMPLANT

## 2018-03-05 NOTE — Progress Notes (Signed)
Pt c/o pain at a level of 8.  Percocet 2 po given.  IS instructed and pt returned demonstration

## 2018-03-05 NOTE — Op Note (Addendum)
Operative Note    Preoperative Diagnosis: DUB, fibroids   Postoperative Diagnosis: Same   Procedure: Total laparoscopic hysterectomy, bilateral salpingectomy, cystoscopy   Surgeon: Mickle Mallory DO Assist: Meisinger, T MD  Anesthesia: General  Fluids: LR 1453ml EBL: 100ml UOP: 328ml   Findings: Multilobular fibroid uterus, normal bilateral tubes and ovaries; nl ureter and bladder   Specimen: uterus, bilateral fallopian tubes and cervix   Procedure Note Pt seen in pre-op. Procedure reviewed and all questions answered; consent verified  Pt taken to operating room and placed in supine position with her arms extended. General anesthesia was administered and found to be adequate.She was then placed in the dorsal lithotomy position.  Pt was prepped and draped in sterile fashion and a timeout performed. A weighted speculum was placed in the posterior fornix and a sim retractor placed anteriorly. Excellent visualization of the cervix was obtained. Uterus was sounded to 9cm so a size 8 koh was assembled with a small cup and placed with retention sutures at 3 and 9 o'clock. A foley catheter was also placed in a sterile fashion Legs were then lowered and attention turned to her abdomen.  Here a 58mm incision was made at the umbilicus. A 72mm optiview trocar was then placed with the abdomen tented upwards. The laparoscopic camera was used to confirm placement and pneumoperitoneum obtained with CO2 gas to 42mmHg. The patient was placed in trendelenberg and gross survey of pelvis done.The uterus was noted as described above:  At this time one more airseal 61mm port was then placed under direct visualization in right lower quadrant and an 11 site in the left with care taken to avoid the epigastric vessels. Further exploration noted  Both ureters were visualized along lateral sidewalls.  Starting on the the patients left, the left fallopian tube remnant was then grasped with a blunt grasper, elevated  and excised using the harmonic hemostatically. Next the utero-ovarian ligament and the round ligaments were sequentially grasped and excised. The broad ligament was then separated from the uterus as well with a bladder flap created. The cardinal ligament was then excised next at the utero-cervical junction and the ovarian vessel clamped, cauterized and cut. The same was done on the patients right with similar results by Dr Willis Modena Next the bladder reflection was dissected away. The vaginal occluder was filled with 60cc of saline and starting anteriorly and working laterally the uterus and cervix were amputated off the superior vagina. The uterus, cervix and fallopian tubes were removed.  The pelvis was irrigated and hemostasis noted. The angles of the vaginal cuff were easily seen and using the endostitch with an 0 vicryl v-lock suture, the cuff was closed in a running fashion with 2-3 back stitches to ensure closure.  Further irrigation of the pelvis confirmed no abnormalities or bleeding hence patient was flattened. The 11 port site was closed with 0-vicryl suture with a carter thomasen to ensure closure of the peritoneum.  The remaining  trocars were removed under direct visualization and gas allowed to escape.  Incision sites were closed with 4-0 vicryl suture and dermabond. Counts were noted to be correct. A cystoscopy was performed next after 0.5 of fluorescein administered via IV. Ureteral jets were noted from both orifices.All instruments were thus removed and foley catheter replaced Patient was awakened and taken to recovery room in stable status.

## 2018-03-05 NOTE — Interval H&P Note (Signed)
History and Physical Interval Note: Pt seen this am. No change from H/P.  To OR when ready  03/05/2018 7:18 AM  Susan Morrison  has presented today for surgery, with the diagnosis of irregular periods, polyp of corpus uteri  The various methods of treatment have been discussed with the patient and family. After consideration of risks, benefits and other options for treatment, the patient has consented to  Procedure(s) with comments: TOTAL LAPAROSCOPIC HYSTERECTOMY WITH SALPINGECTOMY, poss open (Bilateral) - Poss Open Hysterectomy CYSTOSCOPY (N/A) as a surgical intervention .  The patient's history has been reviewed, patient examined, no change in status, stable for surgery.  I have reviewed the patient's chart and labs.  Questions were answered to the patient's satisfaction.     [name]

## 2018-03-05 NOTE — Progress Notes (Signed)
Patient ID: Susan Morrison, female   DOB: 11-Aug-1970, 48 y.o.   MRN: [id number] POD#0 Pt doing well. Pain at 3/10 at worst. Has ambulated with no issues and is tolerating PO. Foley just removed. Has no complaints  VSS  ABD - Soft, ND EXT - no homans  A/P: POD#0 s/p TLH/BS, cystoscopy - stable         Routine post op care         Likely discharge to home in am

## 2018-03-05 NOTE — Anesthesia Procedure Notes (Signed)
Procedure Name: Intubation Date/Time: 03/05/2018 7:40 AM Performed by: Albertha Ghee, MD Pre-anesthesia Checklist: Patient identified, Emergency Drugs available, Suction available and Patient being monitored Patient Re-evaluated:Patient Re-evaluated prior to induction Oxygen Delivery Method: Circle system utilized Preoxygenation: Pre-oxygenation with 100% oxygen Induction Type: IV induction Ventilation: Mask ventilation without difficulty Laryngoscope Size: Mac and 3 Grade View: Grade II Tube type: Oral Tube size: 7.0 mm Number of attempts: 1 Airway Equipment and Method: Stylet Placement Confirmation: ETT inserted through vocal cords under direct vision,  positive ETCO2 and breath sounds checked- equal and bilateral Secured at: 21 cm Tube secured with: Tape Dental Injury: Teeth and Oropharynx as per pre-operative assessment

## 2018-03-05 NOTE — Anesthesia Preprocedure Evaluation (Signed)
Anesthesia Evaluation  Patient identified by MRN, date of birth, ID band Patient awake    Reviewed: Allergy & Precautions, H&P , NPO status , Patient's Chart, lab work & pertinent test results  History of Anesthesia Complications (+) PONV and history of anesthetic complications  Airway Mallampati: II   Neck ROM: full    Dental   Pulmonary neg pulmonary ROS,    breath sounds clear to auscultation       Cardiovascular negative cardio ROS   Rhythm:regular Rate:Normal     Neuro/Psych  Neuromuscular disease    GI/Hepatic   Endo/Other  obese  Renal/GU Renal diseasestones     Musculoskeletal  (+) Arthritis ,   Abdominal   Peds  Hematology   Anesthesia Other Findings   Reproductive/Obstetrics H/o breast CA                             Anesthesia Physical Anesthesia Plan  ASA: II  Anesthesia Plan: General   Post-op Pain Management:    Induction: Intravenous  PONV Risk Score and Plan: 4 or greater and Ondansetron, Dexamethasone, Midazolam, Scopolamine patch - Pre-op and Treatment may vary due to age or medical condition  Airway Management Planned: Oral ETT  Additional Equipment:   Intra-op Plan:   Post-operative Plan: Extubation in OR  Informed Consent: I have reviewed the patients History and Physical, chart, labs and discussed the procedure including the risks, benefits and alternatives for the proposed anesthesia with the patient or authorized representative who has indicated his/her understanding and acceptance.       Plan Discussed with: CRNA, Anesthesiologist and Surgeon  Anesthesia Plan Comments:         Anesthesia Quick Evaluation

## 2018-03-05 NOTE — Transfer of Care (Signed)
  Last Vitals:  Vitals Value Taken Time  BP 115/63 03/05/2018 10:00 AM  Temp    Pulse 63 03/05/2018 10:04 AM  Resp 24 03/05/2018 10:04 AM  SpO2 95 % 03/05/2018 10:04 AM  Vitals shown include unvalidated device data.  Last Pain:  Vitals:   03/05/18 0541  TempSrc: Oral         Immediate Anesthesia Transfer of Care Note  Patient: Bennetta Laos  Procedure(s) Performed: Procedure(s) (LRB): TOTAL LAPAROSCOPIC HYSTERECTOMY WITH SALPINGECTOMY, poss open (Bilateral) CYSTOSCOPY (N/A)  Patient Location: PACU  Anesthesia Type: General  Level of Consciousness: awake, alert  and oriented  Airway & Oxygen Therapy: Patient Spontanous Breathing and Patient connected to nasal cannula oxygen  Post-op Assessment: Report given to PACU RN and Post -op Vital signs reviewed and stable  Post vital signs: Reviewed and stable  Complications: No apparent anesthesia complications

## 2018-03-06 ENCOUNTER — Encounter (HOSPITAL_BASED_OUTPATIENT_CLINIC_OR_DEPARTMENT_OTHER): Payer: Self-pay | Admitting: Obstetrics and Gynecology

## 2018-03-06 DIAGNOSIS — N938 Other specified abnormal uterine and vaginal bleeding: Secondary | ICD-10-CM | POA: Diagnosis not present

## 2018-03-06 LAB — CBC
HCT: 36.5 % (ref 36.0–46.0)
Hemoglobin: 11.8 g/dL — ABNORMAL LOW (ref 12.0–15.0)
MCH: 24 pg — ABNORMAL LOW (ref 26.0–34.0)
MCHC: 32.3 g/dL (ref 30.0–36.0)
MCV: 74.3 fL — ABNORMAL LOW (ref 80.0–100.0)
Platelets: 172 10*3/uL (ref 150–400)
RBC: 4.91 MIL/uL (ref 3.87–5.11)
RDW: 17.5 % — ABNORMAL HIGH (ref 11.5–15.5)
WBC: 10.9 10*3/uL — ABNORMAL HIGH (ref 4.0–10.5)
nRBC: 0 % (ref 0.0–0.2)

## 2018-03-06 MED ORDER — OXYCODONE-ACETAMINOPHEN 5-325 MG PO TABS: 1.0000 | ORAL_TABLET | ORAL | 0 refills | Status: AC | PRN

## 2018-03-06 MED ORDER — TRAMADOL HCL 50 MG PO TABS: 50.0000 mg | ORAL_TABLET | Freq: Four times a day (QID) | ORAL | 0 refills | Status: AC | PRN

## 2018-03-06 MED ORDER — DOCUSATE SODIUM 100 MG PO CAPS: 100.0000 mg | ORAL_CAPSULE | Freq: Every day | ORAL | 0 refills | Status: DC

## 2018-03-06 MED ORDER — OXYCODONE-ACETAMINOPHEN 5-325 MG PO TABS
ORAL_TABLET | ORAL | Status: AC
  Filled 2018-03-06: qty 1

## 2018-03-06 NOTE — Anesthesia Postprocedure Evaluation (Signed)
Anesthesia Post Note  Patient: Susan Morrison  Procedure(s) Performed: TOTAL LAPAROSCOPIC HYSTERECTOMY WITH SALPINGECTOMY, poss open (Bilateral Abdomen) CYSTOSCOPY (N/A Bladder)     Patient location during evaluation: PACU Anesthesia Type: General Level of consciousness: awake and alert Pain management: pain level controlled Vital Signs Assessment: post-procedure vital signs reviewed and stable Respiratory status: spontaneous breathing, nonlabored ventilation, respiratory function stable and patient connected to nasal cannula oxygen Cardiovascular status: blood pressure returned to baseline and stable Postop Assessment: no apparent nausea or vomiting Anesthetic complications: no    Last Vitals:  Vitals:   03/06/18 0530 03/06/18 0830  BP: 121/69 113/64  Pulse: (!) 56 (!) 50  Resp: 16 18  Temp: 36.8 C 37.3 C  SpO2: 98% 98%    Last Pain:  Vitals:   03/06/18 0903  TempSrc:   PainSc: [place]

## 2018-03-06 NOTE — Discharge Instructions (Signed)
Call office with any concerns [phone] 

## 2018-03-06 NOTE — Discharge Summary (Signed)
Physician Discharge Summary  Patient ID: KELVIN BURPEE MRN: [id number] DOB/AGE: 47-Jan-1972 47 y.o.  Admit date: 03/05/2018 Discharge date: 03/06/2018  Admission Diagnoses:  Discharge Diagnoses:  Active Problems:   DUB (dysfunctional uterine bleeding)   S/P laparoscopic hysterectomy   Status post laparoscopic hysterectomy   Discharged Condition: stable  Hospital Course: Pt monitored post op overnight - did very well. Stable for discharge to home this morning  Consults: None  Significant Diagnostic Studies: labs: stable  Treatments: analgesia: acetaminophen w/ codeine and ultram  Discharge Exam: Blood pressure 113/64, pulse (!) 50, temperature 99.2 F (37.3 C), resp. rate 18, height 5\' 6"  (1.676 m), weight 106.9 kg, SpO2 98 %. General appearance: alert, cooperative and no distress GI: normal findings: no masses palpable and soft, non-tender Extremities: extremities normal, atraumatic, no cyanosis or edema  Disposition: Discharge disposition: 01-Home or Self Care       Discharge Instructions    Call MD for:  difficulty breathing, headache or visual disturbances   Complete by:  As directed    Call MD for:  persistant dizziness or light-headedness   Complete by:  As directed    Call MD for:  persistant nausea and vomiting   Complete by:  As directed    Call MD for:  redness, tenderness, or signs of infection (pain, swelling, redness, odor or green/yellow discharge around incision site)   Complete by:  As directed    Call MD for:  severe uncontrolled pain   Complete by:  As directed    Call MD for:  temperature >100.4   Complete by:  As directed    Diet - low sodium heart healthy   Complete by:  As directed    Driving Restrictions   Complete by:  As directed    None while taking narcotic pain medication   Increase activity slowly   Complete by:  As directed    Lifting restrictions   Complete by:  As directed    Lift <15 lbs weight   Sexual Activity  Restrictions   Complete by:  As directed    Absolutely none for 6 weeks     Allergies as of 03/06/2018      Reactions   Ibuprofen Other (See Comments)   Kidney issues       Medication List    TAKE these medications   acetaminophen 500 MG tablet Commonly known as:  TYLENOL Take 1,000 mg by mouth every 6 (six) hours as needed (for pain.).   diclofenac sodium 1 % Gel Commonly known as:  VOLTAREN Apply 2 g topically 4 (four) times daily. Rub into affected area of foot 2 to 4 times daily What changed:    when to take this  reasons to take this   docusate sodium 100 MG capsule Commonly known as:  COLACE Take 1 capsule (100 mg total) by mouth daily.   multivitamin with minerals tablet Take 1 tablet by mouth daily.   oxyCODONE-acetaminophen 5-325 MG tablet Commonly known as:  PERCOCET/ROXICET Take 1 tablet by mouth every 4 (four) hours as needed for up to 7 days for severe pain.   traMADol 50 MG tablet Commonly known as:  ULTRAM Take 1 tablet (50 mg total) by mouth every 6 (six) hours as needed for up to 7 days for moderate pain (mild pain).      Follow-up Information    Sherlyn Hay, DO. Go in 2 week(s).   Specialty:  Obstetrics and Gynecology Why:  For post op  visit Contact information: [address] [place] [address] [phone]           Signed: [name] 03/06/2018, 8:47 AM

## 2018-03-09 ENCOUNTER — Encounter: Payer: Self-pay | Admitting: Podiatry

## 2018-03-11 ENCOUNTER — Encounter: Payer: Self-pay | Admitting: Nurse Practitioner

## 2018-03-16 ENCOUNTER — Encounter: Payer: Self-pay | Admitting: Podiatry

## 2018-03-17 ENCOUNTER — Telehealth: Payer: Self-pay | Admitting: *Deleted

## 2018-03-17 ENCOUNTER — Encounter: Payer: Medicaid Other | Admitting: Nurse Practitioner

## 2018-03-17 DIAGNOSIS — T1490XA Injury, unspecified, initial encounter: Secondary | ICD-10-CM

## 2018-03-17 DIAGNOSIS — T148XXA Other injury of unspecified body region, initial encounter: Secondary | ICD-10-CM

## 2018-03-17 DIAGNOSIS — M779 Enthesopathy, unspecified: Secondary | ICD-10-CM

## 2018-03-17 NOTE — Telephone Encounter (Signed)
[employer] - Medicaid requires clinicals for pre-cert, Case: [id number]. Faxed clinicals to [facility].

## 2018-03-17 NOTE — Telephone Encounter (Signed)
[employer] - Medicaid - [place] states Case: [id number] has been denied may appeal through the health plan, but quicker to resubmit, and since the original Case was denied there will be no duplication of order/service requested.

## 2018-03-17 NOTE — Telephone Encounter (Signed)
Thanks. Please let me know if I need to do anything.

## 2018-03-20 NOTE — Telephone Encounter (Signed)
I called pt again and requested she contact [facility] to make certain the 56/97/9480 appt was still valid. Pt agreed and thanked me.

## 2018-03-20 NOTE — Telephone Encounter (Signed)
I informed pt Approval had been received for her MRI and to contact [facility] to schedule. Pt states she has MRI scheduled for 03/25/2018. Faxed orders to [facility].

## 2018-03-20 NOTE — Telephone Encounter (Signed)
[facility] [id number] RIGHT ANKLE, AUTHORIZATION:  [id number], EFFECTIVE:  03/17/2018, END:  04/16/2018. FAXED TO [facility].

## 2018-03-25 ENCOUNTER — Ambulatory Visit
Admission: RE | Admit: 2018-03-25 | Discharge: 2018-03-25 | Disposition: A | Discharge status: Home / Self Care | Payer: Medicaid Other | Source: Ambulatory Visit | Attending: Podiatry | Admitting: Podiatry

## 2018-03-25 DIAGNOSIS — T148XXA Other injury of unspecified body region, initial encounter: Secondary | ICD-10-CM

## 2018-03-25 DIAGNOSIS — T1490XA Injury, unspecified, initial encounter: Secondary | ICD-10-CM

## 2018-03-25 DIAGNOSIS — M779 Enthesopathy, unspecified: Secondary | ICD-10-CM

## 2018-03-26 ENCOUNTER — Encounter: Payer: Self-pay | Admitting: Nurse Practitioner

## 2018-03-26 ENCOUNTER — Other Ambulatory Visit: Payer: Self-pay | Admitting: Hematology and Oncology

## 2018-03-26 ENCOUNTER — Ambulatory Visit: Payer: Medicaid Other | Admitting: Nurse Practitioner

## 2018-03-26 VITALS — BP 120/86 | HR 67 | Temp 98.7°F | Ht 66.0 in | Wt 232.6 lb

## 2018-03-26 DIAGNOSIS — Z0000 Encounter for general adult medical examination without abnormal findings: Secondary | ICD-10-CM | POA: Diagnosis not present

## 2018-03-26 DIAGNOSIS — Z13220 Encounter for screening for lipoid disorders: Secondary | ICD-10-CM

## 2018-03-26 DIAGNOSIS — Z136 Encounter for screening for cardiovascular disorders: Secondary | ICD-10-CM

## 2018-03-26 DIAGNOSIS — Z9889 Other specified postprocedural states: Secondary | ICD-10-CM

## 2018-03-26 DIAGNOSIS — D500 Iron deficiency anemia secondary to blood loss (chronic): Secondary | ICD-10-CM

## 2018-03-26 DIAGNOSIS — Z853 Personal history of malignant neoplasm of breast: Secondary | ICD-10-CM

## 2018-03-26 LAB — LIPID PANEL
Cholesterol: 170 mg/dL (ref 0–200)
HDL: 41.9 mg/dL (ref >39.00)
LDL Cholesterol: 103 mg/dL — ABNORMAL HIGH (ref 0–99)
NonHDL: 127.98
Total CHOL/HDL Ratio: 4
Triglycerides: 126 mg/dL (ref 0.0–149.0)
VLDL: 25.2 mg/dL (ref 0.0–40.0)

## 2018-03-26 LAB — CBC WITH DIFFERENTIAL/PLATELET
Basophils Absolute: 0.1 10*3/uL (ref 0.0–0.1)
Basophils Relative: 0.9 % (ref 0.0–3.0)
Eosinophils Absolute: 0.2 10*3/uL (ref 0.0–0.7)
Eosinophils Relative: 3.5 % (ref 0.0–5.0)
HCT: 42.2 % (ref 36.0–46.0)
HEMOGLOBIN: 13.8 g/dL (ref 12.0–15.0)
Lymphocytes Relative: 32.2 % (ref 12.0–46.0)
Lymphs Abs: 1.8 10*3/uL (ref 0.7–4.0)
MCHC: 32.7 g/dL (ref 30.0–36.0)
MCV: 72.8 fl — ABNORMAL LOW (ref 78.0–100.0)
Monocytes Absolute: 0.5 10*3/uL (ref 0.1–1.0)
Monocytes Relative: 9.1 % (ref 3.0–12.0)
Neutro Abs: 3.1 10*3/uL (ref 1.4–7.7)
Neutrophils Relative %: 54.3 % (ref 43.0–77.0)
Platelets: 214 10*3/uL (ref 150.0–400.0)
RBC: 5.8 Mil/uL — AB (ref 3.87–5.11)
RDW: 17.6 % — ABNORMAL HIGH (ref 11.5–15.5)
WBC: 5.6 10*3/uL (ref 4.0–10.5)

## 2018-03-26 LAB — COMPREHENSIVE METABOLIC PANEL
ALT: 41 U/L — ABNORMAL HIGH (ref 0–35)
AST: 31 U/L (ref 0–37)
Albumin: 3.8 g/dL (ref 3.5–5.2)
Alkaline Phosphatase: 71 U/L (ref 39–117)
BUN: 14 mg/dL (ref 6–23)
CO2: 27 mEq/L (ref 19–32)
Calcium: 9.3 mg/dL (ref 8.4–10.5)
Chloride: 104 mEq/L (ref 96–112)
Creatinine, Ser: 0.71 mg/dL (ref 0.40–1.20)
GFR: 107.01 mL/min (ref >60.00)
Glucose, Bld: 89 mg/dL (ref 70–99)
Potassium: 4.3 mEq/L (ref 3.5–5.1)
Sodium: 138 mEq/L (ref 135–145)
Total Bilirubin: 0.6 mg/dL (ref 0.2–1.2)
Total Protein: 7.1 g/dL (ref 6.0–8.3)

## 2018-03-26 LAB — TSH: TSH: 4.5 u[IU]/mL (ref 0.35–4.50)

## 2018-03-26 NOTE — Patient Instructions (Signed)
Go to lab for blood draw.  Schedule eye exam every 1-2years  Resume regular exercise once released by Dr. Terri Piedra.  Schedule annual mammogram in October.  Health Maintenance, Female Adopting a healthy lifestyle and getting preventive care can go a long way to promote health and wellness. Talk with your health care provider about what schedule of regular examinations is right for you. This is a good chance for you to check in with your provider about disease prevention and staying healthy. In between checkups, there are plenty of things you can do on your own. Experts have done a lot of research about which lifestyle changes and preventive measures are most likely to keep you healthy. Ask your health care provider for more information. Weight and diet Eat a healthy diet  Be sure to include plenty of vegetables, fruits, low-fat dairy products, and lean protein.  Do not eat a lot of foods high in solid fats, added sugars, or salt.  Get regular exercise. This is one of the most important things you can do for your health. ? Most adults should exercise for at least 150 minutes each week. The exercise should increase your heart rate and make you sweat (moderate-intensity exercise). ? Most adults should also do strengthening exercises at least twice a week. This is in addition to the moderate-intensity exercise. Maintain a healthy weight  Body mass index (BMI) is a measurement that can be used to identify possible weight problems. It estimates body fat based on height and weight. Your health care provider can help determine your BMI and help you achieve or maintain a healthy weight.  For females 51 years of age and older: ? A BMI below 18.5 is considered underweight. ? A BMI of 18.5 to 24.9 is normal. ? A BMI of 25 to 29.9 is considered overweight. ? A BMI of 30 and above is considered obese. Watch levels of cholesterol and blood lipids  You should start having your blood tested for lipids and  cholesterol at 47 years of age, then have this test every 5 years.  You may need to have your cholesterol levels checked more often if: ? Your lipid or cholesterol levels are high. ? You are older than 47 years of age. ? You are at high risk for heart disease. Cancer screening Lung Cancer  Lung cancer screening is recommended for adults 25-21 years old who are at high risk for lung cancer because of a history of smoking.  A yearly low-dose CT scan of the lungs is recommended for people who: ? Currently smoke. ? Have quit within the past 15 years. ? Have at least a 30-pack-year history of smoking. A pack year is smoking an average of one pack of cigarettes a day for 1 year.  Yearly screening should continue until it has been 15 years since you quit.  Yearly screening should stop if you develop a health problem that would prevent you from having lung cancer treatment. Breast Cancer  Practice breast self-awareness. This means understanding how your breasts normally appear and feel.  It also means doing regular breast self-exams. Let your health care provider know about any changes, no matter how small.  If you are in your 20s or 30s, you should have a clinical breast exam (CBE) by a health care provider every 1-3 years as part of a regular health exam.  If you are 43 or older, have a CBE every year. Also consider having a breast X-ray (mammogram) every year.  If  you have a family history of breast cancer, talk to your health care provider about genetic screening.  If you are at high risk for breast cancer, talk to your health care provider about having an MRI and a mammogram every year.  Breast cancer gene (BRCA) assessment is recommended for women who have family members with BRCA-related cancers. BRCA-related cancers include: ? Breast. ? Ovarian. ? Tubal. ? Peritoneal cancers.  Results of the assessment will determine the need for genetic counseling and BRCA1 and BRCA2  testing. Cervical Cancer Your health care provider may recommend that you be screened regularly for cancer of the pelvic organs (ovaries, uterus, and vagina). This screening involves a pelvic examination, including checking for microscopic changes to the surface of your cervix (Pap test). You may be encouraged to have this screening done every 3 years, beginning at age 13.  For women ages 3-65, health care providers may recommend pelvic exams and Pap testing every 3 years, or they may recommend the Pap and pelvic exam, combined with testing for human papilloma virus (HPV), every 5 years. Some types of HPV increase your risk of cervical cancer. Testing for HPV may also be done on women of any age with unclear Pap test results.  Other health care providers may not recommend any screening for nonpregnant women who are considered low risk for pelvic cancer and who do not have symptoms. Ask your health care provider if a screening pelvic exam is right for you.  If you have had past treatment for cervical cancer or a condition that could lead to cancer, you need Pap tests and screening for cancer for at least 20 years after your treatment. If Pap tests have been discontinued, your risk factors (such as having a new sexual partner) need to be reassessed to determine if screening should resume. Some women have medical problems that increase the chance of getting cervical cancer. In these cases, your health care provider may recommend more frequent screening and Pap tests. Colorectal Cancer  This type of cancer can be detected and often prevented.  Routine colorectal cancer screening usually begins at 47 years of age and continues through 47 years of age.  Your health care provider may recommend screening at an earlier age if you have risk factors for colon cancer.  Your health care provider may also recommend using home test kits to check for hidden blood in the stool.  A small camera at the end of a  tube can be used to examine your colon directly (sigmoidoscopy or colonoscopy). This is done to check for the earliest forms of colorectal cancer.  Routine screening usually begins at age 56.  Direct examination of the colon should be repeated every 5-10 years through 47 years of age. However, you may need to be screened more often if early forms of precancerous polyps or small growths are found. Skin Cancer  Check your skin from head to toe regularly.  Tell your health care provider about any new moles or changes in moles, especially if there is a change in a mole's shape or color.  Also tell your health care provider if you have a mole that is larger than the size of a pencil eraser.  Always use sunscreen. Apply sunscreen liberally and repeatedly throughout the day.  Protect yourself by wearing long sleeves, pants, a wide-brimmed hat, and sunglasses whenever you are outside. Heart disease, diabetes, and high blood pressure  High blood pressure causes heart disease and increases the risk of stroke.  High blood pressure is more likely to develop in: ? People who have blood pressure in the high end of the normal range (130-139/85-89 mm Hg). ? People who are overweight or obese. ? People who are African American.  If you are 55-60 years of age, have your blood pressure checked every 3-5 years. If you are 52 years of age or older, have your blood pressure checked every year. You should have your blood pressure measured twice-once when you are at a hospital or clinic, and once when you are not at a hospital or clinic. Record the average of the two measurements. To check your blood pressure when you are not at a hospital or clinic, you can use: ? An automated blood pressure machine at a pharmacy. ? A home blood pressure monitor.  If you are between 66 years and 28 years old, ask your health care provider if you should take aspirin to prevent strokes.  Have regular diabetes screenings. This  involves taking a blood sample to check your fasting blood sugar level. ? If you are at a normal weight and have a low risk for diabetes, have this test once every three years after 47 years of age. ? If you are overweight and have a high risk for diabetes, consider being tested at a younger age or more often. Preventing infection Hepatitis B  If you have a higher risk for hepatitis B, you should be screened for this virus. You are considered at high risk for hepatitis B if: ? You were born in a country where hepatitis B is common. Ask your health care provider which countries are considered high risk. ? Your parents were born in a high-risk country, and you have not been immunized against hepatitis B (hepatitis B vaccine). ? You have HIV or AIDS. ? You use needles to inject street drugs. ? You live with someone who has hepatitis B. ? You have had sex with someone who has hepatitis B. ? You get hemodialysis treatment. ? You take certain medicines for conditions, including cancer, organ transplantation, and autoimmune conditions. Hepatitis C  Blood testing is recommended for: ? Everyone born from [birth year] through 1964. ? Anyone with known risk factors for hepatitis C. Sexually transmitted infections (STIs)  You should be screened for sexually transmitted infections (STIs) including gonorrhea and chlamydia if: ? You are sexually active and are younger than 47 years of age. ? You are older than 47 years of age and your health care provider tells you that you are at risk for this type of infection. ? Your sexual activity has changed since you were last screened and you are at an increased risk for chlamydia or gonorrhea. Ask your health care provider if you are at risk.  If you do not have HIV, but are at risk, it may be recommended that you take a prescription medicine daily to prevent HIV infection. This is called pre-exposure prophylaxis (PrEP). You are considered at risk if: ? You are  sexually active and do not regularly use condoms or know the HIV status of your partner(s). ? You take drugs by injection. ? You are sexually active with a partner who has HIV. Talk with your health care provider about whether you are at high risk of being infected with HIV. If you choose to begin PrEP, you should first be tested for HIV. You should then be tested every 3 months for as long as you are taking PrEP. Pregnancy  If you are premenopausal and  you may become pregnant, ask your health care provider about preconception counseling.  If you may become pregnant, take 400 to 800 micrograms (mcg) of folic acid every day.  If you want to prevent pregnancy, talk to your health care provider about birth control (contraception). Osteoporosis and menopause  Osteoporosis is a disease in which the bones lose minerals and strength with aging. This can result in serious bone fractures. Your risk for osteoporosis can be identified using a bone density scan.  If you are 69 years of age or older, or if you are at risk for osteoporosis and fractures, ask your health care provider if you should be screened.  Ask your health care provider whether you should take a calcium or vitamin D supplement to lower your risk for osteoporosis.  Menopause may have certain physical symptoms and risks.  Hormone replacement therapy may reduce some of these symptoms and risks. Talk to your health care provider about whether hormone replacement therapy is right for you. Follow these instructions at home:  Schedule regular health, dental, and eye exams.  Stay current with your immunizations.  Do not use any tobacco products including cigarettes, chewing tobacco, or electronic cigarettes.  If you are pregnant, do not drink alcohol.  If you are breastfeeding, limit how much and how often you drink alcohol.  Limit alcohol intake to no more than 1 drink per day for nonpregnant women. One drink equals 12 ounces of  beer, 5 ounces of wine, or 1 ounces of hard liquor.  Do not use street drugs.  Do not share needles.  Ask your health care provider for help if you need support or information about quitting drugs.  Tell your health care provider if you often feel depressed.  Tell your health care provider if you have ever been abused or do not feel safe at home. This information is not intended to replace advice given to you by your health care provider. Make sure you discuss any questions you have with your health care provider. Document Released: 08/06/2010 Document Revised: 06/29/2015 Document Reviewed: 10/25/2014 Elsevier Interactive Patient Education  2019 [employer].

## 2018-03-26 NOTE — Progress Notes (Signed)
Subjective:    Patient ID: Susan Morrison, female    DOB: 1970-08-12, 48 y.o.   MRN: [id number]  Patient presents today for complete physical  HPI  Denies any acute complains  Sexual History (orientation,birth control, marital status, STD):not sexually active at this time due to recent hysterectomy, married with 3 adult children  Depression/Suicide: Depression screen [facility] 2/9 03/26/2018 02/12/2017 11/14/2016 03/05/2016  Decreased Interest 0 0 0 0  Down, Depressed, Hopeless 0 0 0 0  PHQ - 2 Score 0 0 0 0   Vision:will schedule  Dental:up to date  Immunizations: (TDAP, Hep C screen, Pneumovax, Influenza, zoster)  Health Maintenance  Topic Date Due  . Tetanus Vaccine  02/05/2019  . Pap Smear  03/05/2021  . Flu Shot  Completed  . HIV Screening  Completed   Diet:regular.  Weight:  Wt Readings from Last 3 Encounters:  03/26/18 232 lb 9.6 oz (105.5 kg)  03/05/18 235 lb 9.6 oz (106.9 kg)  02/26/18 230 lb (104.3 kg)    Exercise:on hole due to recent hysterctomy  Fall Risk: Fall Risk  03/26/2018 11/14/2017 02/12/2017 11/14/2016 03/05/2016  Falls in the past year? 0 No No No No    Advanced Directive: Advanced Directives 03/05/2018  Does Patient Have a Medical Advance Directive? No  Would patient like information on creating a medical advance directive? No - Patient declined     Medications and allergies reviewed with patient and updated if appropriate.  Patient Active Problem List   Diagnosis Date Noted  . S/P laparoscopic hysterectomy 03/05/2018  . Status post laparoscopic hysterectomy 03/05/2018  . Closed displaced fracture of right great toe 01/21/2018  . Uterine leiomyoma 10/08/2017  . Chemotherapy-induced peripheral neuropathy ([place]) 08/15/2017  . Port catheter in place 06/11/2016  . Genetic testing 06/03/2016  . Keratoconjunctivitis of both eyes 06/03/2016  . UPJ obstruction, congenital 05/22/2016  . Iron deficiency anemia due to chronic blood loss 04/03/2016  .  Malignant neoplasm of lower-inner quadrant of left breast in female, estrogen receptor negative ([place]) 03/26/2016  . Anemia 03/05/2016  . Left breast mass 03/05/2016    Current Outpatient Medications on File Prior to Visit  Medication Sig Dispense Refill  . acetaminophen (TYLENOL) 500 MG tablet Take 1,000 mg by mouth every 6 (six) hours as needed (for pain.).     Marland Kitchen diclofenac sodium (VOLTAREN) 1 % GEL Apply 2 g topically 4 (four) times daily. Rub into affected area of foot 2 to 4 times daily (Patient taking differently: Apply 2 g topically 4 (four) times daily as needed (foot pain). Rub into affected area of foot 2 to 4 times daily) 100 g 2  . docusate sodium (COLACE) 100 MG capsule Take 1 capsule (100 mg total) by mouth daily. 10 capsule 0  . Multiple Vitamins-Minerals (MULTIVITAMIN WITH MINERALS) tablet Take 1 tablet by mouth daily.     No current facility-administered medications on file prior to visit.     Past Medical History:  Diagnosis Date  . Anemia   . Arthritis   . Breast mass 03/21/2016   left 7:00  . Cancer ([place])    left brest cancer   . Genetic testing 06/03/2016   Ms. Brownfield underwent genetic counseling and testing for hereditary cancer syndromes on 05/15/2016. Her results were negative for mutations in all 46 genes analyzed by [facility]'s 46-gene Common Hereditary Cancers Panel. Genes analyzed include: APC, ATM, AXIN2, BARD1, BMPR1A, BRCA1, BRCA2, BRIP1, CDH1, CDKN2A, CHEK2, CTNNA1, DICER1, EPCAM, GREM1, HOXB13, KIT, MEN1, MLH1,  MSH2, MSH3, MSH6, MUTYH, NBN,  . History of blood transfusion   . History of kidney stones   . History of radiation therapy 12/04/16-01/14/17   left breast 50.4 Gy in 28 fractions  . Personal history of chemotherapy    March-June 2018  . Personal history of radiation therapy    Oct-Dec 2018  . PONV (postoperative nausea and vomiting)     Past Surgical History:  Procedure Laterality Date  . ABDOMINAL HYSTERECTOMY  03/05/2018  . BREAST  LUMPECTOMY Left 2018  . CYSTOSCOPY N/A 03/05/2018   Procedure: CYSTOSCOPY;  Surgeon: Sherlyn Hay, DO;  [facility];  Service: Gynecology;  Laterality: N/A;  . DILATION AND CURETTAGE OF UTERUS     last done 2years ago  . PORT-A-CATH REMOVAL     12/2016   . PORTACATH PLACEMENT N/A 04/15/2016   Procedure: INSERTION PORT-A-CATH WITH [place];  Surgeon: Alphonsa Overall, MD;  Location: [facility];  Service: General;  Laterality: N/A;  . RADIOACTIVE SEED GUIDED AXILLARY SENTINEL LYMPH NODE Left 10/24/2016   Procedure: RADIOACTIVE SEED GUIDED AXILLARY TARGETED SENTINEL LYMPH NODE DISSECTION;  Surgeon: Alphonsa Overall, MD;  Location: [facility];  Service: General;  Laterality: Left;  . RADIOACTIVE SEED GUIDED EXCISIONAL BREAST BIOPSY Bilateral 10/24/2016   Procedure: RIGHT BREAST RADIOACTIVE SEED GUIDED EXCISIONAL BREAST BIOPSIES X'S 2, LEFT BREAST RADIOACTIVE SEED GUIDED EXCISIONAL BIOPSY X2;  Surgeon: Alphonsa Overall, MD;  Location: [facility];  Service: General;  Laterality: Bilateral;  . ROBOT ASSISTED PYELOPLASTY Left 03/05/2017   Procedure: XI ROBOTIC ASSISTED PYELOPLASTY;  Surgeon: Ardis Hughs, MD;  Location: [facility];  Service: Urology;  Laterality: Left;  . TOTAL LAPAROSCOPIC HYSTERECTOMY WITH SALPINGECTOMY Bilateral 03/05/2018   Procedure: TOTAL LAPAROSCOPIC HYSTERECTOMY WITH SALPINGECTOMY, poss open;  Surgeon: Sherlyn Hay, DO;  Location: [facility];  Service: Gynecology;  Laterality: Bilateral;  Poss Open Hysterectomy  . TUBAL LIGATION  1996    Social History   Socioeconomic History  . Marital status: Married    Spouse name: Not on file  . Number of children: 3  . Years of education: 27  . Highest education level: Not on file  Occupational History  . Occupation: Best boy: [employer]  . Financial resource strain: Not on file  . Food insecurity:    Worry: Not on file    Inability:  Not on file  . Transportation needs:    Medical: Not on file    Non-medical: Not on file  Tobacco Use  . Smoking status: Never Smoker  . Smokeless tobacco: Never Used  Substance and Sexual Activity  . Alcohol use: Yes    Comment: wine/social  . Drug use: No  . Sexual activity: Yes    Birth control/protection: Surgical  Lifestyle  . Physical activity:    Days per week: Not on file    Minutes per session: Not on file  . Stress: Not on file  Relationships  . Social connections:    Talks on phone: Not on file    Gets together: Not on file    Attends religious service: Not on file    Active member of club or organization: Not on file    Attends meetings of clubs or organizations: Not on file    Relationship status: Not on file  Other Topics Concern  . Not on file  Social History Narrative   Lives with fiancee in a one story home.  Works as a Freight forwarder for [employer].     Education: 2 years of college.     Family History  Problem Relation Age of Onset  . Hypertension Mother   . Diabetes Mother   . Breast cancer Paternal Grandmother 75       d.62 bilateral breast cancer  . Other Father        Homicide        Review of Systems  Constitutional: Negative for fever, malaise/fatigue and weight loss.  HENT: Negative for congestion and sore throat.   Eyes:       Negative for visual changes  Respiratory: Negative for cough and shortness of breath.   Cardiovascular: Negative for chest pain, palpitations and leg swelling.  Gastrointestinal: Negative for blood in stool, constipation, diarrhea and heartburn.  Genitourinary: Negative for dysuria, frequency and urgency.  Musculoskeletal: Negative for falls, joint pain and myalgias.  Skin: Negative for rash.  Neurological: Positive for tingling. Negative for dizziness, sensory change and headaches.       Intermittent LE, worse at night, no need to medication at this time  Endo/Heme/Allergies: Does not bruise/bleed easily.    Psychiatric/Behavioral: Negative for depression, substance abuse and suicidal ideas. The patient is not nervous/anxious.     Objective:   Vitals:   03/26/18 0921  BP: 120/86  Pulse: 67  Temp: 98.7 F (37.1 C)  SpO2: 95%    Body mass index is 37.54 kg/m.   Physical Examination:  Physical Exam Vitals signs reviewed. Exam conducted with a chaperone present.  Constitutional:      General: She is not in acute distress.    Appearance: She is well-developed.  HENT:     Right Ear: Tympanic membrane, ear canal and external ear normal.     Left Ear: Tympanic membrane, ear canal and external ear normal.     Nose: Nose normal.     Mouth/Throat:     Mouth: Mucous membranes are moist.     Pharynx: No oropharyngeal exudate.  Eyes:     Conjunctiva/sclera: Conjunctivae normal.     Pupils: Pupils are equal, round, and reactive to light.  Cardiovascular:     Rate and Rhythm: Normal rate and regular rhythm.     Heart sounds: Normal heart sounds.  Pulmonary:     Effort: Pulmonary effort is normal. No respiratory distress.     Breath sounds: Normal breath sounds.  Chest:     Chest wall: No tenderness.     Breasts:        Right: Normal.        Left: Normal.  Abdominal:     General: Bowel sounds are normal.     Palpations: Abdomen is soft.  Musculoskeletal: Normal range of motion.     Right lower leg: No edema.     Left lower leg: No edema.  Lymphadenopathy:     Upper Body:     Right upper body: No supraclavicular, axillary or pectoral adenopathy.     Left upper body: No supraclavicular, axillary or pectoral adenopathy.  Skin:    General: Skin is warm and dry.     Findings: No rash.  Neurological:     Mental Status: She is alert and oriented to person, place, and time.     Deep Tendon Reflexes: Reflexes are normal and symmetric.  Psychiatric:        Mood and Affect: Mood normal.        Behavior: Behavior normal.  Thought Content: Thought content normal.     ASSESSMENT and PLAN:  Deja was seen today for annual exam.  Diagnoses and all orders for this visit:  Preventative health care -     Comprehensive metabolic panel -     TSH -     Lipid panel  Encounter for lipid screening for cardiovascular disease -     Lipid panel  Iron deficiency anemia due to chronic blood loss -     CBC w/Diff -     Iron, TIBC and Ferritin Panel    S/P laparoscopic hysterectomy Ovaries present  Uterine leiomyoma S/p hysterectomy 03/05/2018 by Dr. Terri Piedra Ovaries still present. Benign pathology report  UPJ obstruction, congenital Followed by Dr. Jeffie Pollock with [facility]. Seen once a year per patient  Chemotherapy-induced peripheral neuropathy (H[facility]) Intermittent, worse at night. No need for medication at this time per patient  Malignant neoplasm of lower-inner quadrant of left breast in female, estrogen receptor negative ([place]) Followed by Dr. Lindi Adie annually. Last mammogram 11/2017     Problem List Items Addressed This Visit      Other   Iron deficiency anemia due to chronic blood loss   Relevant Orders   CBC w/Diff   Iron, TIBC and Ferritin Panel    Other Visit Diagnoses    Preventative health care    -  Primary   Relevant Orders   Comprehensive metabolic panel   TSH   Lipid panel   Encounter for lipid screening for cardiovascular disease       Relevant Orders   Lipid panel       Follow up: Return if symptoms worsen or fail to improve.  Wilfred Lacy, NP

## 2018-03-26 NOTE — Assessment & Plan Note (Addendum)
S/p hysterectomy 03/05/2018 by Dr. Terri Piedra Ovaries still present. Benign pathology report

## 2018-03-26 NOTE — Assessment & Plan Note (Signed)
Followed by Dr. Lindi Adie annually. Last mammogram 11/2017

## 2018-03-26 NOTE — Assessment & Plan Note (Signed)
Intermittent, worse at night. No need for medication at this time per patient

## 2018-03-26 NOTE — Assessment & Plan Note (Signed)
Followed by Dr. Jeffie Pollock with [facility]. Seen once a year per patient

## 2018-03-26 NOTE — Assessment & Plan Note (Signed)
Ovaries present

## 2018-03-27 ENCOUNTER — Encounter: Payer: Self-pay | Admitting: Nurse Practitioner

## 2018-03-27 LAB — IRON,TIBC AND FERRITIN PANEL
%SAT: 18 % (calc) (ref 16–45)
Ferritin: 99 ng/mL (ref 16–232)
Iron: 48 ug/dL (ref 40–190)
TIBC: 266 mcg/dL (calc) (ref 250–450)

## 2018-04-01 ENCOUNTER — Encounter: Payer: Self-pay | Admitting: Podiatry

## 2018-04-30 ENCOUNTER — Inpatient Hospital Stay: Payer: Medicaid Other | Attending: Hematology and Oncology | Admitting: Hematology and Oncology

## 2018-04-30 DIAGNOSIS — Z171 Estrogen receptor negative status [ER-]: Secondary | ICD-10-CM

## 2018-04-30 DIAGNOSIS — C50312 Malignant neoplasm of lower-inner quadrant of left female breast: Secondary | ICD-10-CM

## 2018-04-30 DIAGNOSIS — Z1231 Encounter for screening mammogram for malignant neoplasm of breast: Secondary | ICD-10-CM

## 2018-04-30 NOTE — Assessment & Plan Note (Signed)
Treatment summary: 1.03/22/2016: Left breast biopsy 8:00: IDC high-grade with necrosis, left axillary lymph node biopsy IDC, ER 0%, PR 0%, HER-2 negative ratio 1.42, Ki-67 90%; palpable lump 3.5 x 2.6 x 2.2 cm and 2.8 Center left axillary lymph node, T2 N1 stage IIB 2. Neoadjuvant chemotherapy with Adriamycin and Cytoxan dose dense 4 followed by Taxolweekly 12completed 08/30/2016 2.09/20/2018Right lumpectomy: No malignancy identified. Complete pathologic response. 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 Adjuvant radiation therapy 12/04/2016-01/14/2017  Chemotherapy-induced neuropathy: Continue gabapentin and Cymbalta. Joint pains: Rheumatology referral  Return to clinic in 1 year for follow-up 

## 2018-04-30 NOTE — Progress Notes (Signed)
HEMATOLOGY-ONCOLOGY TELEPHONE VISIT PROGRESS NOTE  I connected with '@PTNAME'$ @ on 04/30/18 at  8:45 AM EDT by telephone and verified that I am speaking with the correct person using two identifiers.  I discussed the limitations, risks, security and privacy concerns of performing an evaluation and management service by telephone and the availability of in person appointments.  I also discussed with the patient that there may be a patient responsible charge related to this service. The patient expressed understanding and agreed to proceed.   History of Present Illness: Surveillance and follow-up of breast cancer.  Peripheral neuropathy from prior chemotherapy has improved.  She is complaining of ankle pain.  Denies any breast discomfort.    Malignant neoplasm of lower-inner quadrant of left breast in female, estrogen receptor negative ([place])   03/22/2016 Initial Diagnosis    Left breast biopsy 8:00: IDC high-grade with necrosis, left axillary lymph node biopsy IDC, ER 0%, PR 0%, HER-2 negative ratio 1.42, Ki-67 90%; palpable lump 3.5 x 2.6 x 2.2 cm and 2.8 Center left axillary lymph node, T2 N1 stage IIB    04/10/2016 Initial Biopsy    Additional biopsies left breast 9:30: Sclerosed intraductal papilloma with every 3 hours, right breast 1:00: Complex sclerosing lesion with UDH, right breast 8:00: Complex sclerosing lesion with UDH    04/17/2016 - 08/28/2016 Neo-Adjuvant Chemotherapy    Dose dense Adriamycin and Cytoxan 4 followed by Taxol weekly 12    05/15/2016 Genetic Testing    Genetic counseling and testing for hereditary cancer syndromes performed on 05/15/2016. Results are negative for pathogenic mutations in 46 genes analyzed by [facility]'s Common Hereditary Cancers Panel. Results are dated 05/24/2016. Genes tested: APC, ATM, AXIN2, BARD1, BMPR1A, BRCA1, BRCA2, BRIP1, CDH1, CDKN2A, CHEK2, CTNNA1, DICER1, EPCAM, GREM1, HOXB13, KIT, MEN1, MLH1, MSH2, MSH3, MSH6, MUTYH, NBN, NF1, NTHL1, PALB2, PDGFRA,  PMS2, POLD1, POLE, PTEN, RAD50, RAD51C, RAD51D, SDHA, SDHB, SDHC, SDHD, SMAD4, SMARCA4, STK11, TP53, TSC1, TSC2, and VHL.  A variant of uncertain significance (not clinically actionable) was noted in MUTYH.       10/24/2016 Surgery    Right lumpectomy: No malignancy identified. Complete pathologic response. 0/4 lymph nodes negative, previously triple negative disease with a Ki-67 of 90%    12/04/2016 - 01/14/2017 Radiation Therapy    Adjuvant radiation therapy     Observations/Objective:  Patient reports that the neuropathy is much better.  She has been having ankle pain for which she is seeing orthopedics.  She had a ankle MRI as well as x-rays done.  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    Assessment Plan:  Malignant neoplasm of lower-inner quadrant of left breast in female, estrogen receptor negative ([place]) Treatment summary: 1.03/22/2016: Left breast biopsy 8:00: IDC high-grade with necrosis, left axillary lymph node biopsy IDC, ER 0%, PR 0%, HER-2 negative ratio 1.42, Ki-67 90%; palpable lump 3.5 x 2.6 x 2.2 cm and 2.8 Center left axillary lymph node, T2 N1 stage IIB 2. Neoadjuvant chemotherapy with Adriamycin and Cytoxan dose dense 4 followed by Taxolweekly 12completed 08/30/2016 2.09/20/2018Right lumpectomy: No malignancy identified. Complete pathologic response. 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 Adjuvant radiation therapy 12/04/2016-01/14/2017  Chemotherapy-induced neuropathy: much improved, no longer taking gabapentin or Cymbalta. Ankle pain: Following with orthopedics.  Return to clinic in 1 year for follow-up    I discussed the assessment and treatment plan with the patient. The patient was provided an opportunity to ask questions  and all were answered. The patient agreed with the plan and demonstrated an understanding of the instructions. The patient was advised to call back  or seek an in-person evaluation if the symptoms worsen or if the condition fails to improve as anticipated.   I provided 15 minutes of non-face-to-face time during this encounter. Harriette Ohara, MD

## 2018-05-04 ENCOUNTER — Telehealth: Payer: Self-pay | Admitting: Hematology and Oncology

## 2018-05-04 NOTE — Telephone Encounter (Signed)
Scheduled a f/u in one year per 3/26 sch message - sent reminder letter in the mail.

## 2018-05-11 ENCOUNTER — Other Ambulatory Visit: Payer: Self-pay

## 2018-05-11 ENCOUNTER — Ambulatory Visit
Admission: RE | Admit: 2018-05-11 | Discharge: 2018-05-11 | Disposition: A | Discharge status: Home / Self Care | Payer: Self-pay | Source: Ambulatory Visit | Attending: Hematology and Oncology | Admitting: Hematology and Oncology

## 2018-05-11 DIAGNOSIS — Z853 Personal history of malignant neoplasm of breast: Secondary | ICD-10-CM

## 2018-05-11 DIAGNOSIS — Z9889 Other specified postprocedural states: Secondary | ICD-10-CM

## 2018-05-29 ENCOUNTER — Encounter: Payer: Self-pay | Admitting: Nurse Practitioner

## 2018-07-24 ENCOUNTER — Encounter: Payer: Self-pay | Admitting: Nurse Practitioner

## 2018-12-14 ENCOUNTER — Other Ambulatory Visit: Payer: Medicaid Other

## 2019-01-07 ENCOUNTER — Telehealth: Payer: Self-pay | Admitting: Hematology and Oncology

## 2019-01-07 ENCOUNTER — Ambulatory Visit
Admission: RE | Admit: 2019-01-07 | Discharge: 2019-01-07 | Disposition: A | Discharge status: Home / Self Care | Payer: 59 | Source: Ambulatory Visit | Attending: Hematology and Oncology | Admitting: Hematology and Oncology

## 2019-01-07 DIAGNOSIS — Z1231 Encounter for screening mammogram for malignant neoplasm of breast: Secondary | ICD-10-CM

## 2019-01-07 MED ORDER — GADOBUTROL 1 MMOL/ML IV SOLN
10.0000 mL | Freq: Once | INTRAVENOUS | Status: AC | PRN
  Administered 2019-01-07: 10 mL via INTRAVENOUS

## 2019-01-07 NOTE — Telephone Encounter (Signed)
I left a voicemail for the patient that the breast MRI is normal. 

## 2019-02-09 ENCOUNTER — Ambulatory Visit: Payer: 59 | Attending: Internal Medicine

## 2019-02-09 DIAGNOSIS — Z20822 Contact with and (suspected) exposure to covid-19: Secondary | ICD-10-CM

## 2019-02-10 ENCOUNTER — Encounter: Payer: Self-pay | Admitting: Nurse Practitioner

## 2019-02-10 ENCOUNTER — Telehealth (INDEPENDENT_AMBULATORY_CARE_PROVIDER_SITE_OTHER): Payer: 59 | Admitting: Nurse Practitioner

## 2019-02-10 ENCOUNTER — Other Ambulatory Visit: Payer: Self-pay

## 2019-02-10 VITALS — Temp 98.1°F | Ht 66.0 in | Wt 230.0 lb

## 2019-02-10 DIAGNOSIS — Z20822 Contact with and (suspected) exposure to covid-19: Secondary | ICD-10-CM

## 2019-02-10 DIAGNOSIS — G8929 Other chronic pain: Secondary | ICD-10-CM

## 2019-02-10 DIAGNOSIS — Z8781 Personal history of (healed) traumatic fracture: Secondary | ICD-10-CM

## 2019-02-10 DIAGNOSIS — R6889 Other general symptoms and signs: Secondary | ICD-10-CM

## 2019-02-10 DIAGNOSIS — M25571 Pain in right ankle and joints of right foot: Secondary | ICD-10-CM

## 2019-02-10 MED ORDER — MELOXICAM 7.5 MG PO TABS: 7.5000 mg | ORAL_TABLET | Freq: Every day | ORAL | 0 refills | Status: DC

## 2019-02-10 NOTE — Patient Instructions (Addendum)
You will be contacted to schedule appt with [facility] podiatry as requested. Use meloxicam once daily x 1week. Repeat BMP in 2weeks if COVID test is negative and/or symptoms resolve.  Call office if flu like symptoms worsen. Use tylenol 500mg  every 6hrs prn for pain and fever Use mucinex DM/robitussin/delsym for cough.  This information is directly available on the CDC website: RunningShows.co.za.html    Source:CDC Reference to specific commercial products, manufacturers, companies, or trademarks does not constitute its endorsement or recommendation by the [place], [place], or Centers for [employer] and Prevention.

## 2019-02-10 NOTE — Progress Notes (Signed)
Virtual Visit via Video Note  I connected with Susan Morrison on 02/10/19 at 10:15 AM EST by a video enabled telemedicine application and verified that I am speaking with the correct person using two identifiers.  Location: Patient:home Provider:office Participants: patient and provider I discussed the limitations of evaluation and management by telemedicine and the availability of in person appointments. The patient expressed understanding and agreed to proceed.  CC: heaache--going on for a while but gettting worse,coughing at times and had fever yesterday/FYI--pt waiting for result of covid test since her mother-in law tested positive on Monday (living in the same house).  Right ankle pain since 02/2018. pt is seeing foot specialist but pt feels like this doctor is not helping much but took x-ray. pt request referral for another foot specialist?   History of Present Illness: Pending COVID test. R. ankle pain: onset 02/2018 after great toe fracture. No improvement with voltaren gel or use of boot or tylenol. MRI ankle indicates Marrow edema. She will like second opinion from another podiatrist. 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 Last BMP 03/2018: normal renal function.  URI  This is a new problem. The current episode started in the past 7 days. The problem has been unchanged. There has been no fever. Associated symptoms include congestion, coughing, headaches and sinus pain. Pertinent negatives include no abdominal pain, chest pain, diarrhea, dysuria, ear pain, joint pain, joint swelling, nausea, plugged ear sensation, rash, rhinorrhea, sneezing, sore throat, swollen glands, vomiting or wheezing. She has tried acetaminophen, decongestant and increased fluids for the symptoms. The treatment provided significant relief.   Observations/Objective: Physical Exam  Constitutional: She is oriented to person, place, and time. No  distress.  Pulmonary/Chest: Effort normal.  Musculoskeletal:     Cervical back: Normal range of motion and neck supple.  Neurological: She is alert and oriented to person, place, and time.  Skin: Skin is dry.  Psychiatric: She has a normal mood and affect. Her behavior is normal. Thought content normal.  Vitals reviewed.  Assessment and Plan: Susan Morrison was seen today for headache and ankle pain.  Diagnoses and all orders for this visit:  Close exposure to COVID-19 virus  Flu-like symptoms  Chronic pain of right ankle -     Ambulatory referral to Podiatry -     meloxicam (MOBIC) 7.5 MG tablet; Take 1 tablet (7.5 mg total) by mouth daily.  History of fracture of phalanx of toe -     Ambulatory referral to Podiatry -     meloxicam (MOBIC) 7.5 MG tablet; Take 1 tablet (7.5 mg total) by mouth daily.   Follow Up Instructions: See avs   I discussed the assessment and treatment plan with the patient. The patient was provided an opportunity to ask questions and all were answered. The patient agreed with the plan and demonstrated an understanding of the instructions.   The patient was advised to call back or seek an in-person evaluation if the symptoms worsen or if the condition fails to improve as anticipated.   Wilfred Lacy, NP

## 2019-02-11 LAB — NOVEL CORONAVIRUS, NAA: SARS-CoV-2, NAA: NOT DETECTED

## 2019-03-10 ENCOUNTER — Encounter: Payer: Self-pay | Admitting: Nurse Practitioner

## 2019-03-11 ENCOUNTER — Other Ambulatory Visit: Payer: Self-pay

## 2019-03-11 ENCOUNTER — Encounter: Payer: Self-pay | Admitting: Nurse Practitioner

## 2019-03-11 ENCOUNTER — Telehealth (INDEPENDENT_AMBULATORY_CARE_PROVIDER_SITE_OTHER): Payer: 59 | Admitting: Nurse Practitioner

## 2019-03-11 VITALS — BP 130/88 | HR 57 | Temp 97.8°F | Ht 66.0 in | Wt 231.0 lb

## 2019-03-11 DIAGNOSIS — H1132 Conjunctival hemorrhage, left eye: Secondary | ICD-10-CM

## 2019-03-11 MED ORDER — POLYETHYL GLYCOL-PROPYL GLYCOL 0.4-0.3 % OP SOLN: 1.0000 [drp] | OPHTHALMIC | 0 refills | Status: DC | PRN

## 2019-03-11 NOTE — Patient Instructions (Signed)
Call office if you develop any signs on bacterial conjunctivitis or change in vision.  Subconjunctival Hemorrhage Subconjunctival hemorrhage is bleeding that happens between the white part of your eye (sclera) and the clear membrane that covers the outside of your eye (conjunctiva). There are many tiny blood vessels near the surface of your eye. A subconjunctival hemorrhage happens when one or more of these vessels breaks and bleeds, causing a red patch to appear on your eye. This is similar to a bruise. Depending on the amount of bleeding, the red patch may only cover a small area of your eye or it may cover the entire visible part of the sclera. If a lot of blood collects under the conjunctiva, there may also be swelling. Subconjunctival hemorrhages do not affect your vision or cause pain, but your eye may feel irritated if there is swelling. Subconjunctival hemorrhages usually do not require treatment, and they usually disappear on their own within two weeks. What are the causes? This condition may be caused by:  Mild trauma, such as rubbing your eye too hard.  Blunt injuries, such as from playing sports or having contact with a deployed airbag.  Coughing, sneezing, or vomiting.  Straining, such as when lifting a heavy object.  High blood pressure.  Recent eye surgery.  Diabetes.  Certain medicines, especially blood thinners (anticoagulants).  Other conditions, such as eye tumors, bleeding disorders, or blood vessel abnormalities. Subconjunctival hemorrhages can also happen without an obvious cause. What are the signs or symptoms? Symptoms of this condition include:  A bright red or dark red patch on the white part of the eye. The red area may: ? Spread out to cover a larger area of the eye before it goes away. ? Turn brownish-yellow before it goes away.  Swelling around the eye.  Mild eye irritation. How is this diagnosed? This condition is diagnosed with a physical exam. If  your subconjunctival hemorrhage was caused by trauma, your health care provider may refer you to an eye specialist (ophthalmologist) or another specialist to check for other injuries. You may have other tests, including:  An eye exam.  A blood pressure check.  Blood tests to check for bleeding disorders. If your subconjunctival hemorrhage was caused by trauma, X-rays or a CT scan may be done to check for other injuries. How is this treated? Usually, treatment is not needed for this condition. If you have discomfort, your health care provider may recommend eye drops or cold compresses. Follow these instructions at home:  Take over-the-counter and prescription medicines only as directed by your health care provider.  Use eye drops or cold compresses to help with discomfort as directed by your health care provider.  Avoid activities, things, and environments that may irritate or injure your eye.  Keep all follow-up visits as told by your health care provider. This is important. Contact a health care provider if:  You have pain in your eye.  The bleeding does not go away within 3 weeks.  You keep getting new subconjunctival hemorrhages. Get help right away if:  Your vision changes or you have difficulty seeing.  You suddenly develop severe sensitivity to light.  You develop a severe headache, persistent vomiting, confusion, or abnormal tiredness (lethargy).  Your eye seems to bulge or protrude from your eye socket.  You develop unexplained bruises on your body.  You have unexplained bleeding in another area of your body. Summary  Subconjunctival hemorrhage is bleeding that happens between the white part of your  eye and the clear membrane that covers the outside of your eye.  This condition is similar to a bruise.  Subconjunctival hemorrhages usually do not require treatment, and they usually disappear on their own within two weeks.  Use eye drops or cold compresses to help  with discomfort as directed by your health care provider. This information is not intended to replace advice given to you by your health care provider. Make sure you discuss any questions you have with your health care provider. Document Revised: 07/08/2018 Document Reviewed: 10/22/2017 Elsevier Patient Education  [place].

## 2019-03-11 NOTE — Progress Notes (Signed)
Virtual Visit via Video Note  I connected with@ on 03/11/19 at  9:45 AM EST by a video enabled telemedicine application and verified that I am speaking with the correct person using two identifiers.  Location: Patient:Home Provider: Office Participants: patient and provider  I discussed the limitations of evaluation and management by telemedicine and the availability of in person appointments. I also discussed with the patient that there may be a patient responsible charge related to this service. The patient expressed understanding and agreed to proceed.  [id number] eye redness x 2days  History of Present Illness: Eye Problem  The left eye is affected. This is a new problem. The current episode started in the past 7 days. The problem occurs constantly. The problem has been unchanged. There was no injury mechanism. The patient is experiencing no pain. There is no known exposure to pink eye. She does not wear contacts. Associated symptoms include eye redness and itching. Pertinent negatives include no blurred vision, eye discharge, double vision, fever, foreign body sensation, nausea, photophobia, recent URI or vomiting. She has tried eye drops for the symptoms. The treatment provided no relief.   Observations/Objective: Physical Exam  Constitutional: She is oriented to person, place, and time.  Eyes: EOM and lids are normal. Right eye exhibits no chemosis, no discharge, no exudate and no hordeolum. No foreign body present in the right eye. Left eye exhibits no chemosis, no discharge, no exudate and no hordeolum. No foreign body present in the left eye. Right conjunctiva is not injected. Right conjunctiva has no hemorrhage. Left conjunctiva is not injected. Left conjunctiva has a hemorrhage.    Cardiovascular: Normal rate.  Pulmonary/Chest: Effort normal.  Musculoskeletal:     Cervical back: Normal range of motion and neck supple.  Neurological: She is alert and oriented to person, place, and  time.  Vitals reviewed.  Assessment and Plan: Jillene was seen today for eye problem.  Diagnoses and all orders for this visit:  Subconjunctival hemorrhage of left eye -     Polyethyl Glycol-Propyl Glycol 0.4-0.3 % SOLN; Apply 1 drop to eye as needed.   Follow Up Instructions: See avs   I discussed the assessment and treatment plan with the patient. The patient was provided an opportunity to ask questions and all were answered. The patient agreed with the plan and demonstrated an understanding of the instructions.   The patient was advised to call back or seek an in-person evaluation if the symptoms worsen or if the condition fails to improve as anticipated.   Wilfred Lacy, NP

## 2019-03-31 ENCOUNTER — Other Ambulatory Visit: Payer: Self-pay | Admitting: Nurse Practitioner

## 2019-03-31 DIAGNOSIS — Z853 Personal history of malignant neoplasm of breast: Secondary | ICD-10-CM

## 2019-04-30 ENCOUNTER — Telehealth: Payer: Self-pay | Admitting: Hematology and Oncology

## 2019-04-30 NOTE — Telephone Encounter (Signed)
Rescheduled 03/30 appointment due to provider being on PAL, called patient regarding rescheduled appointment. Patient is notified.

## 2019-05-04 ENCOUNTER — Inpatient Hospital Stay: Payer: 59 | Admitting: Hematology and Oncology

## 2019-05-10 NOTE — Progress Notes (Signed)
Patient Care Team: Nche, Charlene Brooke, NP as PCP - General (Internal Medicine) Alphonsa Overall, MD as Consulting Physician (General Surgery) Nicholas Lose, MD as Consulting Physician (Hematology and Oncology) Eppie Gibson, MD as Attending Physician (Radiation Oncology) Gardenia Phlegm, NP as Nurse Practitioner (Hematology and Oncology)  DIAGNOSIS:    ICD-10-CM   1. Malignant neoplasm of lower-inner quadrant of left breast in female, estrogen receptor negative ([place])  C50.312    Z17.1     SUMMARY OF ONCOLOGIC HISTORY: Oncology History  Malignant neoplasm of lower-inner quadrant of left breast in female, estrogen receptor negative ([place])  03/22/2016 Initial Diagnosis   Left breast biopsy 8:00: IDC high-grade with necrosis, left axillary lymph node biopsy IDC, ER 0%, PR 0%, HER-2 negative ratio 1.42, Ki-67 90%; palpable lump 3.5 x 2.6 x 2.2 cm and 2.8 Center left axillary lymph node, T2 N1 stage IIB   04/10/2016 Initial Biopsy   Additional biopsies left breast 9:30: Sclerosed intraductal papilloma with every 3 hours, right breast 1:00: Complex sclerosing lesion with UDH, right breast 8:00: Complex sclerosing lesion with UDH   04/17/2016 - 08/28/2016 Neo-Adjuvant Chemotherapy   Dose dense Adriamycin and Cytoxan 4 followed by Taxol weekly 12   05/15/2016 Genetic Testing   Genetic counseling and testing for hereditary cancer syndromes performed on 05/15/2016. Results are negative for pathogenic mutations in 46 genes analyzed by [facility] Common Hereditary Cancers Panel. Results are dated 05/24/2016. Genes tested: APC, ATM, AXIN2, BARD1, BMPR1A, BRCA1, BRCA2, BRIP1, CDH1, CDKN2A, CHEK2, CTNNA1, DICER1, EPCAM, GREM1, HOXB13, KIT, MEN1, MLH1, MSH2, MSH3, MSH6, MUTYH, NBN, NF1, NTHL1, PALB2, PDGFRA, PMS2, POLD1, POLE, PTEN, RAD50, RAD51C, RAD51D, SDHA, SDHB, SDHC, SDHD, SMAD4, SMARCA4, STK11, TP53, TSC1, TSC2, and VHL.  A variant of uncertain significance (not clinically actionable) was  noted in MUTYH.      10/24/2016 Surgery   Right lumpectomy: No malignancy identified. Complete pathologic response. 0/4 lymph nodes negative, previously triple negative disease with a Ki-67 of 90%   12/04/2016 - 01/14/2017 Radiation Therapy   Adjuvant radiation therapy     CHIEF COMPLIANT: Follow-up of right breast cancer  INTERVAL HISTORY: Susan Morrison is a 49 y.o. 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 She is currently on surveillance. Mammogram on 05/11/18 showed no evidence of malignancy bilaterally. Breast MRI on 01/07/19 showed no evidence of malignancy. She presents to the clinic today for annual follow-up.  Her major complaint is right ankle discomfort.  ALLERGIES:  is allergic to ibuprofen.  MEDICATIONS:  Current Outpatient Medications  Medication Sig Dispense Refill  . acetaminophen (TYLENOL) 500 MG tablet Take 1,000 mg by mouth every 6 (six) hours as needed (for pain.).     Marland Kitchen Multiple Vitamins-Minerals (MULTIVITAMIN WITH MINERALS) tablet Take 1 tablet by mouth daily.    Vladimir Faster Glycol-Propyl Glycol 0.4-0.3 % SOLN Apply 1 drop to eye as needed. 15 mL 0   No current facility-administered medications for this visit.    PHYSICAL EXAMINATION: ECOG PERFORMANCE STATUS: 1 - Symptomatic but completely ambulatory  Vitals:   05/11/19 1026  BP: 135/89  Pulse: 61  Resp: 18  Temp: 98.3 F (36.8 C)  SpO2: 99%   Filed Weights   05/11/19 1026  Weight: 234 lb (106.1 kg)    Patient a little bit (ERI. LABORATORY DATA:  I have reviewed the data as listed CMP Latest Ref Rng & Units 03/26/2018 09/11/2017 03/07/2017  Glucose 70 - 99 mg/dL 89 98 88  BUN 6 - 23 mg/dL 14 11 16   Creatinine 0.40 - 1.20 mg/dL 0.71 0.66 0.85  Sodium 135 - 145 mEq/L 138 140 137  Potassium 3.5 - 5.1 mEq/L 4.3 4.9 3.9  Chloride 96 - 112 mEq/L 104 105 106  CO2 19 - 32 mEq/L 27 24 26   Calcium 8.4 - 10.5 mg/dL  9.3 9.3 8.9  Total Protein 6.0 - 8.3 g/dL 7.1 6.9 -  Total Bilirubin 0.2 - 1.2 mg/dL 0.6 0.7 -  Alkaline Phos 39 - 117 U/L 71 95 -  AST 0 - 37 U/L 31 29 -  ALT 0 - 35 U/L 41(H) 34 -    Lab Results  Component Value Date   WBC 5.6 03/26/2018   HGB 13.8 03/26/2018   HCT 42.2 03/26/2018   MCV 72.8 (L) 03/26/2018   PLT 214.0 03/26/2018   NEUTROABS 3.1 03/26/2018    ASSESSMENT & PLAN:  Malignant neoplasm of lower-inner quadrant of left breast in female, estrogen receptor negative ([place]) Treatment summary: 1.03/22/2016: Left breast biopsy 8:00: IDC high-grade with necrosis, left axillary lymph node biopsy IDC, ER 0%, PR 0%, HER-2 negative ratio 1.42, Ki-67 90%; palpable lump 3.5 x 2.6 x 2.2 cm and 2.8 Center left axillary lymph node, T2 N1 stage IIB 2. Neoadjuvant chemotherapy with Adriamycin and Cytoxan dose dense 4 followed by Taxolweekly 12completed 08/30/2016 2.09/20/2018Right lumpectomy: No malignancy identified. Complete pathologic response. 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 Ankle pain: I discussed with her the importance of losing weight and exercising.  Breast cancer surveillance: 1.  Breast exam 05/11/2019: Benign 2.  Breast MRI 01/07/2019: Benign, annual breast MRIs are recommended due to greater than 20% lifetime risk 3.  Mammogram scheduled for 05/12/2019  Return to clinic in 1 year for follow-up    No orders of the defined types were placed in this encounter.  The patient has a good understanding of the overall plan. she agrees with it. she will call with any problems that may develop before the next visit here.  Total time spent:  20 mins including face to face time and time spent for planning, charting and coordination of care  Nicholas Lose, MD 05/11/2019  I, Cloyde Reams Dorshimer, am acting as scribe for Dr. Nicholas Lose.  I have reviewed the above documentation for accuracy and completeness, and I agree with the above.

## 2019-05-11 ENCOUNTER — Inpatient Hospital Stay: Payer: 59 | Attending: Hematology and Oncology | Admitting: Hematology and Oncology

## 2019-05-11 ENCOUNTER — Other Ambulatory Visit: Payer: Self-pay

## 2019-05-11 DIAGNOSIS — G620 Drug-induced polyneuropathy: Secondary | ICD-10-CM | POA: Diagnosis not present

## 2019-05-11 DIAGNOSIS — C50312 Malignant neoplasm of lower-inner quadrant of left female breast: Secondary | ICD-10-CM | POA: Diagnosis not present

## 2019-05-11 DIAGNOSIS — Z923 Personal history of irradiation: Secondary | ICD-10-CM | POA: Insufficient documentation

## 2019-05-11 DIAGNOSIS — M25579 Pain in unspecified ankle and joints of unspecified foot: Secondary | ICD-10-CM | POA: Diagnosis not present

## 2019-05-11 DIAGNOSIS — Z171 Estrogen receptor negative status [ER-]: Secondary | ICD-10-CM | POA: Insufficient documentation

## 2019-05-11 DIAGNOSIS — Z9221 Personal history of antineoplastic chemotherapy: Secondary | ICD-10-CM | POA: Insufficient documentation

## 2019-05-11 NOTE — Assessment & Plan Note (Signed)
Treatment summary: 1.03/22/2016: Left breast biopsy 8:00: IDC high-grade with necrosis, left axillary lymph node biopsy IDC, ER 0%, PR 0%, HER-2 negative ratio 1.42, Ki-67 90%; palpable lump 3.5 x 2.6 x 2.2 cm and 2.8 Center left axillary lymph node, T2 N1 stage IIB 2. Neoadjuvant chemotherapy with Adriamycin and Cytoxan dose dense 4 followed by Taxolweekly 12completed 08/30/2016 2.09/20/2018Right lumpectomy: No malignancy identified. Complete pathologic response. 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 Ankle pain: Following with orthopedics.  Breast cancer surveillance: 1.  Breast exam 05/11/2019: Benign 2.  Breast MRI 01/07/2019: Benign, annual breast MRIs are recommended due to greater than 20% lifetime risk 3.  Mammogram scheduled for 05/12/2019 Return to clinic in 1 year for follow-up

## 2019-05-12 ENCOUNTER — Ambulatory Visit
Admission: RE | Admit: 2019-05-12 | Discharge: 2019-05-12 | Disposition: A | Discharge status: Home / Self Care | Payer: 59 | Source: Ambulatory Visit | Attending: Nurse Practitioner | Admitting: Nurse Practitioner

## 2019-05-12 DIAGNOSIS — Z853 Personal history of malignant neoplasm of breast: Secondary | ICD-10-CM

## 2019-05-13 ENCOUNTER — Ambulatory Visit: Payer: 59 | Attending: Internal Medicine

## 2019-05-13 ENCOUNTER — Telehealth: Payer: Self-pay | Admitting: Hematology and Oncology

## 2019-05-13 DIAGNOSIS — Z23 Encounter for immunization: Secondary | ICD-10-CM

## 2019-05-13 NOTE — Telephone Encounter (Signed)
Scheduled per 04/05 los, patient has been called and notified. ?

## 2019-05-13 NOTE — Progress Notes (Signed)
   Covid-19 Vaccination Clinic  Name:  Susan Morrison    MRN: [id number] DOB: [birth year]  05/13/2019  [name] was observed post Covid-19 immunization for 15 minutes without incident. She was provided with Vaccine Information Sheet and instruction to access the V-Safe system.   [name] was instructed to call 911 with any severe reactions post vaccine: Marland Kitchen Difficulty breathing  . Swelling of face and throat  . A fast heartbeat  . A bad rash all over body  . Dizziness and weakness   Immunizations Administered    Name Date Dose VIS Date Route   Pfizer COVID-19 Vaccine 05/13/2019  4:51 PM 0.3 mL 01/15/2019 Intramuscular   Manufacturer: [place]   Lot: [id number]   [place]: [id number]

## 2019-06-07 ENCOUNTER — Ambulatory Visit: Payer: 59 | Attending: Internal Medicine

## 2019-06-07 DIAGNOSIS — Z23 Encounter for immunization: Secondary | ICD-10-CM

## 2019-06-07 NOTE — Progress Notes (Signed)
   Covid-19 Vaccination Clinic  Name:  Susan Morrison    MRN: [id number] DOB: Jul 08, 1970  06/07/2019  [name] was observed post Covid-19 immunization for 15 minutes without incident. She was provided with Vaccine Information Sheet and instruction to access the V-Safe system.   [name] was instructed to call 911 with any severe reactions post vaccine: Marland Kitchen Difficulty breathing  . Swelling of face and throat  . A fast heartbeat  . A bad rash all over body  . Dizziness and weakness   Immunizations Administered    Name Date Dose VIS Date Route   Pfizer COVID-19 Vaccine 06/07/2019  4:43 PM 0.3 mL 03/31/2018 Intramuscular   Manufacturer: [place]   Lot: [id number]   [place]: [id number]

## 2019-08-03 ENCOUNTER — Inpatient Hospital Stay: Payer: 59 | Admitting: Hematology and Oncology

## 2019-08-24 ENCOUNTER — Telehealth: Payer: Self-pay | Admitting: Hematology and Oncology

## 2019-08-24 NOTE — Telephone Encounter (Signed)
Scheduled per los, patient has been called and notified. 

## 2019-09-23 ENCOUNTER — Other Ambulatory Visit: Payer: Self-pay

## 2019-09-24 ENCOUNTER — Other Ambulatory Visit: Payer: Self-pay | Admitting: Nurse Practitioner

## 2019-09-24 ENCOUNTER — Encounter: Payer: 59 | Admitting: Nurse Practitioner

## 2019-09-24 ENCOUNTER — Encounter: Payer: Self-pay | Admitting: Nurse Practitioner

## 2019-09-24 DIAGNOSIS — M25571 Pain in right ankle and joints of right foot: Secondary | ICD-10-CM

## 2019-09-24 DIAGNOSIS — G8929 Other chronic pain: Secondary | ICD-10-CM

## 2019-09-24 NOTE — Progress Notes (Signed)
Needs new referral to podiatry due to change in insurance

## 2019-10-02 NOTE — Progress Notes (Signed)
This encounter was created in error - please disregard.

## 2019-10-25 ENCOUNTER — Ambulatory Visit: Payer: Medicaid Other | Admitting: Podiatry

## 2019-11-09 ENCOUNTER — Other Ambulatory Visit: Payer: Self-pay

## 2019-11-09 ENCOUNTER — Other Ambulatory Visit: Payer: Self-pay | Admitting: Podiatry

## 2019-11-09 ENCOUNTER — Ambulatory Visit (INDEPENDENT_AMBULATORY_CARE_PROVIDER_SITE_OTHER): Payer: 59

## 2019-11-09 ENCOUNTER — Ambulatory Visit: Payer: Medicaid Other | Admitting: Podiatry

## 2019-11-09 DIAGNOSIS — G629 Polyneuropathy, unspecified: Secondary | ICD-10-CM | POA: Diagnosis not present

## 2019-11-09 DIAGNOSIS — M779 Enthesopathy, unspecified: Secondary | ICD-10-CM

## 2019-11-09 DIAGNOSIS — M7752 Other enthesopathy of left foot: Secondary | ICD-10-CM

## 2019-11-09 DIAGNOSIS — M7751 Other enthesopathy of right foot: Secondary | ICD-10-CM

## 2019-11-09 DIAGNOSIS — M19071 Primary osteoarthritis, right ankle and foot: Secondary | ICD-10-CM | POA: Diagnosis not present

## 2019-11-11 NOTE — Progress Notes (Signed)
Subjective: 49 year old female presents with left hip down her right foot pain.  Is been ongoing now for several years.  She states she did show with swelling, stiffness and discomfort.  She did finish chemo, radiation 2018 she reports.  She denies recent injury or trauma since I last saw her. Denies any systemic complaints such as fevers, chills, nausea, vomiting. No acute changes since last appointment, and no other complaints at this time.   Objective: AAO x3, NAD DP/PT pulses palpable bilaterally, CRT less than 3 seconds Proceed and diagnosed with neuropathy. Majority of discomfort is to lateral aspect of right foot on the sinus tarsi with there is localized edema.  There is no pain with ankle, subtalar joint range of motion.  Mild discomfort the dorsal medial aspect of foot on the talonavicular joint.  There is no specific area of pinpoint tenderness.  MMT 5/5.  Mild decrease in medial arch upon weightbearing No pain with calf compression, swelling, warmth, erythema  Assessment: 49 year old female with capsulitis, arthritis right foot  Plan: -All treatment options discussed with the patient including all alternatives, risks, complications.  -X-rays obtained reviewed.  No evidence of acute fracture.  Mild arthritic changes are noted. -Steroid injection of the sinus tarsi today.  See procedure note below. -Measure for orthotics today.  I do think try to give her a light arch support will be helpful.  -Previously been diagnosed with neuropathy, not convinced that the pain on her right foot is due to this. -Patient encouraged to call the office with any questions, concerns, change in symptoms.   Procedure: Injection intermediate joint Discussed alternatives, risks, complications and verbal consent was obtained.  Location: Right plantar fascia at the glabrous junction; medial approach. Skin Prep: Betadine. Injectate: 0.5cc 0.5% marcaine plain, 0.5 cc 2% lidocaine plain and, 1 cc kenalog  10. Disposition: Patient tolerated procedure well. Injection site dressed with a band-aid.  Post-injection care was discussed and return precautions discussed.   Return in about 6 weeks (around 12/21/2019).  Trula Slade DPM

## 2019-12-07 ENCOUNTER — Ambulatory Visit: Payer: 59 | Admitting: Orthotics

## 2019-12-07 ENCOUNTER — Other Ambulatory Visit: Payer: Self-pay

## 2019-12-07 DIAGNOSIS — G629 Polyneuropathy, unspecified: Secondary | ICD-10-CM

## 2019-12-07 DIAGNOSIS — M19071 Primary osteoarthritis, right ankle and foot: Secondary | ICD-10-CM

## 2019-12-07 DIAGNOSIS — M779 Enthesopathy, unspecified: Secondary | ICD-10-CM

## 2019-12-13 NOTE — Progress Notes (Signed)
Patient came in today to pick up custom made foot orthotics.  The goals were accomplished and the patient reported no dissatisfaction with said orthotics.  Patient was advised of breakin period and how to report any issues. 

## 2019-12-20 ENCOUNTER — Ambulatory Visit: Payer: 59 | Admitting: Podiatry

## 2020-01-10 ENCOUNTER — Other Ambulatory Visit: Payer: Self-pay

## 2020-01-10 ENCOUNTER — Ambulatory Visit
Admission: RE | Admit: 2020-01-10 | Discharge: 2020-01-10 | Disposition: A | Discharge status: Home / Self Care | Payer: 59 | Source: Ambulatory Visit | Attending: Hematology and Oncology | Admitting: Hematology and Oncology

## 2020-01-10 DIAGNOSIS — Z171 Estrogen receptor negative status [ER-]: Secondary | ICD-10-CM

## 2020-01-10 MED ORDER — GADOBUTROL 1 MMOL/ML IV SOLN
10.0000 mL | Freq: Once | INTRAVENOUS | Status: AC | PRN
  Administered 2020-01-10: 10 mL via INTRAVENOUS

## 2020-01-12 ENCOUNTER — Telehealth: Payer: Self-pay | Admitting: Hematology and Oncology

## 2020-01-12 NOTE — Telephone Encounter (Signed)
I informed her that the breast MRI was negative

## 2020-02-14 ENCOUNTER — Telehealth (INDEPENDENT_AMBULATORY_CARE_PROVIDER_SITE_OTHER): Payer: 59 | Admitting: Nurse Practitioner

## 2020-02-14 ENCOUNTER — Encounter: Payer: Self-pay | Admitting: Nurse Practitioner

## 2020-02-14 VITALS — Ht 66.0 in | Wt 235.0 lb

## 2020-02-14 DIAGNOSIS — J209 Acute bronchitis, unspecified: Secondary | ICD-10-CM | POA: Diagnosis not present

## 2020-02-14 MED ORDER — ALBUTEROL SULFATE HFA 108 (90 BASE) MCG/ACT IN AERS: 1.0000 | INHALATION_SPRAY | Freq: Four times a day (QID) | RESPIRATORY_TRACT | 0 refills | Status: DC | PRN

## 2020-02-14 MED ORDER — BENZONATATE 100 MG PO CAPS: 100.0000 mg | ORAL_CAPSULE | Freq: Three times a day (TID) | ORAL | 0 refills | Status: DC | PRN

## 2020-02-14 MED ORDER — CEFDINIR 300 MG PO CAPS: 300.0000 mg | ORAL_CAPSULE | Freq: Two times a day (BID) | ORAL | 0 refills | Status: DC

## 2020-02-14 NOTE — Patient Instructions (Signed)
Continue robitussin or mucinex for cough Alternate with benzonatate. Take cefdinir with food Maintain adequate oral hydration.

## 2020-02-14 NOTE — Progress Notes (Signed)
Virtual Visit via Video Note  I connected with@ on 02/14/20 at  9:30 AM EST by a video enabled telemedicine application and verified that I am speaking with the correct person using two identifiers.  Location: Patient:Home Provider: Office Participants: patient and provider  I discussed the limitations of evaluation and management by telemedicine and the availability of in person appointments. I also discussed with the patient that there may be a patient responsible charge related to this service. The patient expressed understanding and agreed to proceed.  CC:Pt c/o productive cough with yellow mucus x1 week. Denies any fever, body aches, chils, headaches, or shortness of breathe. Pt has took otc medications (mucinex and nyquil) to help with the cough but there has been no relief.   History of Present Illness: Cough This is a new problem. The current episode started in the past 7 days. The problem has been unchanged. The problem occurs constantly. The cough is productive of sputum. Pertinent negatives include no chest pain, chills, ear congestion, ear pain, fever, headaches, heartburn, hemoptysis, myalgias, nasal congestion, postnasal drip, rash, rhinorrhea, sore throat, shortness of breath, sweats, weight loss or wheezing. The symptoms are aggravated by cold air and lying down. She has tried OTC cough suppressant for the symptoms. The treatment provided no relief. There is no history of asthma, bronchiectasis, bronchitis or environmental allergies.   Observations/Objective: Physical Exam Vitals reviewed.  Constitutional:      General: She is not in acute distress.    Appearance: She is not ill-appearing.  Pulmonary:     Effort: Pulmonary effort is normal.  Neurological:     Mental Status: She is alert and oriented to person, place, and time.    Assessment and Plan: Farida was seen today for acute visit.  Diagnoses and all orders for this visit:  Acute bronchitis, unspecified  organism -     cefdinir (OMNICEF) 300 MG capsule; Take 1 capsule (300 mg total) by mouth 2 (two) times daily. -     benzonatate (TESSALON) 100 MG capsule; Take 1-2 capsules (100-200 mg total) by mouth 3 (three) times daily as needed.   Follow Up Instructions: Continue robitussin or mucinex for cough Alternate with benzonatate. Take cefdinir with food Maintain adequate oral hydration.  I discussed the assessment and treatment plan with the patient. The patient was provided an opportunity to ask questions and all were answered. The patient agreed with the plan and demonstrated an understanding of the instructions.   The patient was advised to call back or seek an in-person evaluation if the symptoms worsen or if the condition fails to improve as anticipated.  Wilfred Lacy, NP

## 2020-03-07 ENCOUNTER — Other Ambulatory Visit: Payer: Self-pay

## 2020-03-07 ENCOUNTER — Ambulatory Visit (INDEPENDENT_AMBULATORY_CARE_PROVIDER_SITE_OTHER): Payer: 59 | Admitting: Nurse Practitioner

## 2020-03-07 ENCOUNTER — Encounter: Payer: Self-pay | Admitting: Nurse Practitioner

## 2020-03-07 VITALS — BP 130/82 | HR 70 | Temp 97.3°F | Ht 66.0 in | Wt 240.0 lb

## 2020-03-07 DIAGNOSIS — M79622 Pain in left upper arm: Secondary | ICD-10-CM

## 2020-03-07 DIAGNOSIS — M7582 Other shoulder lesions, left shoulder: Secondary | ICD-10-CM | POA: Diagnosis not present

## 2020-03-07 MED ORDER — NAPROXEN 500 MG PO TABS: 500.0000 mg | ORAL_TABLET | Freq: Two times a day (BID) | ORAL | 0 refills | Status: DC

## 2020-03-07 NOTE — Progress Notes (Signed)
Subjective:  Patient ID: Bennetta Laos, female    DOB: 1970-12-29  Age: 50 y.o. MRN: [id number]  CC: Acute Visit (Pt c/o sharp pains in her left arm x2 weeks. Pt states the pain comes and goes but when it does happen it last for a long time. )  Shoulder Pain  The pain is present in the left arm and left shoulder. This is a new problem. The current episode started 1 to 4 weeks ago. There has been no history of extremity trauma. The problem occurs intermittently. The problem has been waxing and waning. The quality of the pain is described as aching and dull. Associated symptoms include a limited range of motion and stiffness. Pertinent negatives include no fever, inability to bear weight, itching, joint locking, joint swelling, numbness or tingling. The symptoms are aggravated by activity. She has tried nothing for the symptoms. Family history does not include gout or rheumatoid arthritis. Her past medical history is significant for osteoarthritis. There is no history of diabetes, gout or rheumatoid arthritis.   Reviewed past Medical, Social and Family history today.  Outpatient Medications Prior to Visit  Medication Sig Dispense Refill  . acetaminophen (TYLENOL) 500 MG tablet Take 1,000 mg by mouth every 6 (six) hours as needed (for pain.).     Marland Kitchen albuterol (VENTOLIN HFA) 108 (90 Base) MCG/ACT inhaler Inhale 1-2 puffs into the lungs every 6 (six) hours as needed for wheezing or shortness of breath. 8 g 0  . Multiple Vitamins-Minerals (MULTIVITAMIN WITH MINERALS) tablet Take 1 tablet by mouth daily.    . benzonatate (TESSALON) 100 MG capsule Take 1-2 capsules (100-200 mg total) by mouth 3 (three) times daily as needed. (Patient not taking: Reported on 03/07/2020) 21 capsule 0  . cefdinir (OMNICEF) 300 MG capsule Take 1 capsule (300 mg total) by mouth 2 (two) times daily. (Patient not taking: Reported on 03/07/2020) 14 capsule 0   No facility-administered medications prior to visit.   ROS See  HPI  Objective:  BP 130/82 (BP Location: Right Arm, Patient Position: Sitting, Cuff Size: Large)   Pulse 70   Temp (!) 97.3 F (36.3 C) (Temporal)   Ht 5\' 6"  (1.676 m)   Wt 240 lb (108.9 kg)   SpO2 97%   BMI 38.74 kg/m   Physical Exam Cardiovascular:     Rate and Rhythm: Normal rate.     Pulses: Normal pulses.  Pulmonary:     Effort: Pulmonary effort is normal.  Chest:  Breasts:     Breasts are asymmetrical.     Right: Normal. No axillary adenopathy or supraclavicular adenopathy.     Left: Tenderness and axillary adenopathy present. No swelling, inverted nipple, nipple discharge or supraclavicular adenopathy.    Musculoskeletal:        General: Tenderness present.     Left shoulder: Tenderness present. No swelling, effusion, bony tenderness or crepitus. Normal range of motion. Normal strength. Normal pulse.     Right upper arm: Normal.     Left upper arm: Normal.     Cervical back: Spasms and tenderness present. No torticollis. Pain with movement present. Normal range of motion.     Thoracic back: Normal.       Back:     Right lower leg: No edema.     Left lower leg: No edema.  Lymphadenopathy:     Upper Body:     Right upper body: No supraclavicular, axillary or pectoral adenopathy.     Left upper body:  Axillary adenopathy present. No supraclavicular or pectoral adenopathy.  Neurological:     Mental Status: She is alert and oriented to person, place, and time.    Assessment & Plan:  This visit occurred during the SARS-CoV-2 public health emergency.  Safety protocols were in place, including screening questions prior to the visit, additional usage of staff PPE, and extensive cleaning of exam room while observing appropriate contact time as indicated for disinfecting solutions.   Eurika was seen today for acute visit.  Diagnoses and all orders for this visit:  Rotator cuff tendonitis, left -     naproxen (NAPROSYN) 500 MG tablet; Take 1 tablet (500 mg total) by  mouth 2 (two) times daily with a meal.  Axillary pain, left -     MM Digital Diagnostic Unilat L; Future -     US BREAST COMPLETE UNI LEFT INC AXILLA; Future   Problem List Items Addressed This Visit   None   Visit Diagnoses    Rotator cuff tendonitis, left    -  Primary   Relevant Medications   naproxen (NAPROSYN) 500 MG tablet   Axillary pain, left       Relevant Orders   MM Digital Diagnostic Unilat L   US BREAST COMPLETE UNI LEFT INC AXILLA      Follow-up: No follow-ups on file.  Wilfred Lacy, NP

## 2020-03-07 NOTE — Patient Instructions (Signed)
Rotator Cuff Tendinitis  Rotator cuff tendinitis is inflammation of the tendons in the rotator cuff. Tendons are tough, cord-like bands that connect muscle to bone. The rotator cuff includes all of the muscles and tendons that connect the arm to the shoulder. The rotator cuff holds the head of the humerus, or the upper arm bone, in the cup of the shoulder blade (scapula). This condition can lead to a long-term or chronic tear. The tear may be partial or complete. What are the causes? This condition is usually caused by overusing the rotator cuff. What increases the risk? This condition is more likely to develop in athletes and workers who frequently use their shoulder or reach over their heads. This can include activities such as:  Tennis.  Baseball or softball.  Swimming.  Construction work.  Painting. What are the signs or symptoms? Symptoms of this condition include:  Pain that spreads (radiates) from the shoulder to the upper arm.  Swelling and tenderness in front of the shoulder.  Pain when reaching, pulling, or lifting the arm above the head.  Pain when lowering the arm from above the head.  Minor pain in the shoulder when resting.  Increased pain in the shoulder at night.  Difficulty placing the arm behind the back. How is this diagnosed? This condition is diagnosed with a physical exam and medical history. Tests may also be done, including:  X-rays.  MRI.  Ultrasound.  CT with or without contrast. How is this treated? Treatment for this condition depends on the severity of the condition. In less severe cases, treatment may include:  Rest. This may be done with a sling that holds the shoulder still (immobilization). Your health care provider may also recommend avoiding activities that involve lifting your arm over your head.  Icing the shoulder.  Anti-inflammatory medicines, such as aspirin or ibuprofen. In more severe cases, treatment may  include:  Physical therapy.  Steroid injections.  Surgery. Follow these instructions at home: If you have a sling:  Wear the sling as told by your health care provider. Remove it only as told by your health care provider.  Loosen it if your fingers tingle, become numb, or turn cold and blue.  Keep it clean.  If the sling is not waterproof: ? Do not let it get wet. ? Cover it with a watertight covering when you take a bath or shower. Managing pain, stiffness, and swelling  If directed, put ice on the injured area. To do this: ? If you have a removable sling, remove it as told by your health care provider. ? Put ice in a plastic bag. ? Place a towel between your skin and the bag. ? Leave the ice on for 20 minutes, 2-3 times a day.  Move your fingers often to reduce stiffness and swelling.  Raise (elevate) the injured area above the level of your heart while you are lying down.  Find a comfortable sleeping position, or sleep in a recliner, if available.   Activity  Rest your shoulder as told by your health care provider.  Ask your health care provider when it is safe to drive if you have a sling on your arm.  Return to your normal activities as told by your health care provider. Ask your health care provider what activities are safe for you.  Do any exercises or stretches as told by your health care provider or physical therapist.  If you do repetitive overhead tasks, take small breaks in between and include   stretching exercises as told by your health care provider. General instructions  Do not use any products that contain nicotine or tobacco, such as cigarettes, e-cigarettes, and chewing tobacco. These can delay healing. If you need help quitting, ask your health care provider.  Take over-the-counter and prescription medicines only as told by your health care provider.  Keep all follow-up visits as told by your health care provider. This is important. Contact a health  care provider if:  Your pain gets worse.  You have new pain in your arm, hands, or fingers.  Your pain is not relieved with medicine or does not get better after 6 weeks of treatment.  You have crackling sensations when moving your shoulder in certain directions.  You hear a snapping sound after using your shoulder, followed by severe pain and weakness. Get help right away if:  Your arm, hand, or fingers are numb or tingling.  Your arm, hand, or fingers are swollen or painful or they turn white or blue. Summary  Rotator cuff tendinitis is inflammation of the tendons in the rotator cuff. Tendons are tough, cord-like bands that connect muscle to bone.  This condition is usually caused by overusing the rotator cuff, which includes all of the muscles and tendons that connect the arm to the shoulder.  This condition is more likely to develop in athletes and workers who frequently use their shoulder or reach over their heads.  Treatment generally includes rest, anti-inflammatory medicines, and icing. In some cases, physical therapy and steroid injections may be needed. In severe cases, surgery may be needed. This information is not intended to replace advice given to you by your health care provider. Make sure you discuss any questions you have with your health care provider. Document Revised: 10/26/2018 Document Reviewed: 10/26/2018 Elsevier Patient Education  2021 [employer]  Shoulder Exercises Ask your health care provider which exercises are safe for you. Do exercises exactly as told by your health care provider and adjust them as directed. It is normal to feel mild stretching, pulling, tightness, or discomfort as you do these exercises. Stop right away if you feel sudden pain or your pain gets worse. Do not begin these exercises until told by your health care provider. Stretching exercises External rotation and abduction This exercise is sometimes called corner stretch. This  exercise rotates your arm outward (external rotation) and moves your arm out from your body (abduction). 1. Stand in a doorway with one of your feet slightly in front of the other. This is called a staggered stance. If you cannot reach your forearms to the door frame, stand facing a corner of a room. 2. Choose one of the following positions as told by your health care provider: ? Place your hands and forearms on the door frame above your head. ? Place your hands and forearms on the door frame at the height of your head. ? Place your hands on the door frame at the height of your elbows. 3. Slowly move your weight onto your front foot until you feel a stretch across your chest and in the front of your shoulders. Keep your head and chest upright and keep your abdominal muscles tight. 4. Hold for __________ seconds. 5. To release the stretch, shift your weight to your back foot. Repeat __________ times. Complete this exercise __________ times a day.   Extension, standing 1. Stand and hold a broomstick, a cane, or a similar object behind your back. ? Your hands should be a little wider  than shoulder width apart. ? Your palms should face away from your back. 2. Keeping your elbows straight and your shoulder muscles relaxed, move the stick away from your body until you feel a stretch in your shoulders (extension). ? Avoid shrugging your shoulders while you move the stick. Keep your shoulder blades tucked down toward the middle of your back. 3. Hold for __________ seconds. 4. Slowly return to the starting position. Repeat __________ times. Complete this exercise __________ times a day. Range-of-motion exercises Pendulum 1. Stand near a wall or a surface that you can hold onto for balance. 2. Bend at the waist and let your left / right arm hang straight down. Use your other arm to support you. Keep your back straight and do not lock your knees. 3. Relax your left / right arm and shoulder muscles, and  move your hips and your trunk so your left / right arm swings freely. Your arm should swing because of the motion of your body, not because you are using your arm or shoulder muscles. 4. Keep moving your hips and trunk so your arm swings in the following directions, as told by your health care provider: ? Side to side. ? Forward and backward. ? In clockwise and counterclockwise circles. 5. Continue each motion for __________ seconds, or for as long as told by your health care provider. 6. Slowly return to the starting position. Repeat __________ times. Complete this exercise __________ times a day.   Shoulder flexion, standing 1. Stand and hold a broomstick, a cane, or a similar object. Place your hands a little more than shoulder width apart on the object. Your left / right hand should be palm up, and your other hand should be palm down. 2. Keep your elbow straight and your shoulder muscles relaxed. Push the stick up with your healthy arm to raise your left / right arm in front of your body, and then over your head until you feel a stretch in your shoulder (flexion). ? Avoid shrugging your shoulder while you raise your arm. Keep your shoulder blade tucked down toward the middle of your back. 3. Hold for __________ seconds. 4. Slowly return to the starting position. Repeat __________ times. Complete this exercise __________ times a day.   Shoulder abduction, standing 1. Stand and hold a broomstick, a cane, or a similar object. Place your hands a little more than shoulder width apart on the object. Your left / right hand should be palm up, and your other hand should be palm down. 2. Keep your elbow straight and your shoulder muscles relaxed. Push the object across your body toward your left / right side. Raise your left / right arm to the side of your body (abduction) until you feel a stretch in your shoulder. ? Do not raise your arm above shoulder height unless your health care provider tells you  to do that. ? If directed, raise your arm over your head. ? Avoid shrugging your shoulder while you raise your arm. Keep your shoulder blade tucked down toward the middle of your back. 3. Hold for __________ seconds. 4. Slowly return to the starting position. Repeat __________ times. Complete this exercise __________ times a day. Internal rotation 1. Place your left / right hand behind your back, palm up. 2. Use your other hand to dangle an exercise band, a towel, or a similar object over your shoulder. Grasp the band with your left / right hand so you are holding on to both ends. 3.  Gently pull up on the band until you feel a stretch in the front of your left / right shoulder. The movement of your arm toward the center of your body is called internal rotation. ? Avoid shrugging your shoulder while you raise your arm. Keep your shoulder blade tucked down toward the middle of your back. 4. Hold for __________ seconds. 5. Release the stretch by letting go of the band and lowering your hands. Repeat __________ times. Complete this exercise __________ times a day.   Strengthening exercises External rotation 1. Sit in a stable chair without armrests. 2. Secure an exercise band to a stable object at elbow height on your left / right side. 3. Place a soft object, such as a folded towel or a small pillow, between your left / right upper arm and your body to move your elbow about 4 inches (10 cm) away from your side. 4. Hold the end of the exercise band so it is tight and there is no slack. 5. Keeping your elbow pressed against the soft object, slowly move your forearm out, away from your abdomen (external rotation). Keep your body steady so only your forearm moves. 6. Hold for __________ seconds. 7. Slowly return to the starting position. Repeat __________ times. Complete this exercise __________ times a day.   Shoulder abduction 1. Sit in a stable chair without armrests, or stand up. 2. Hold a  __________ weight in your left / right hand, or hold an exercise band with both hands. 3. Start with your arms straight down and your left / right palm facing in, toward your body. 4. Slowly lift your left / right hand out to your side (abduction). Do not lift your hand above shoulder height unless your health care provider tells you that this is safe. ? Keep your arms straight. ? Avoid shrugging your shoulder while you do this movement. Keep your shoulder blade tucked down toward the middle of your back. 5. Hold for __________ seconds. 6. Slowly lower your arm, and return to the starting position. Repeat __________ times. Complete this exercise __________ times a day.   Shoulder extension 1. Sit in a stable chair without armrests, or stand up. 2. Secure an exercise band to a stable object in front of you so it is at shoulder height. 3. Hold one end of the exercise band in each hand. Your palms should face each other. 4. Straighten your elbows and lift your hands up to shoulder height. 5. Step back, away from the secured end of the exercise band, until the band is tight and there is no slack. 6. Squeeze your shoulder blades together as you pull your hands down to the sides of your thighs (extension). Stop when your hands are straight down by your sides. Do not let your hands go behind your body. 7. Hold for __________ seconds. 8. Slowly return to the starting position. Repeat __________ times. Complete this exercise __________ times a day. Shoulder row 1. Sit in a stable chair without armrests, or stand up. 2. Secure an exercise band to a stable object in front of you so it is at waist height. 3. Hold one end of the exercise band in each hand. Position your palms so that your thumbs are facing the ceiling (neutral position). 4. Bend each of your elbows to a 90-degree angle (right angle) and keep your upper arms at your sides. 5. Step back until the band is tight and there is no  slack. 6. Slowly pull your  elbows back behind you. 7. Hold for __________ seconds. 8. Slowly return to the starting position. Repeat __________ times. Complete this exercise __________ times a day. Shoulder press-ups 1. Sit in a stable chair that has armrests. Sit upright, with your feet flat on the floor. 2. Put your hands on the armrests so your elbows are bent and your fingers are pointing forward. Your hands should be about even with the sides of your body. 3. Push down on the armrests and use your arms to lift yourself off the chair. Straighten your elbows and lift yourself up as much as you comfortably can. ? Move your shoulder blades down, and avoid letting your shoulders move up toward your ears. ? Keep your feet on the ground. As you get stronger, your feet should support less of your body weight as you lift yourself up. 4. Hold for __________ seconds. 5. Slowly lower yourself back into the chair. Repeat __________ times. Complete this exercise __________ times a day.   Wall push-ups 1. Stand so you are facing a stable wall. Your feet should be about one arm-length away from the wall. 2. Lean forward and place your palms on the wall at shoulder height. 3. Keep your feet flat on the floor as you bend your elbows and lean forward toward the wall. 4. Hold for __________ seconds. 5. Straighten your elbows to push yourself back to the starting position. Repeat __________ times. Complete this exercise __________ times a day.   This information is not intended to replace advice given to you by your health care provider. Make sure you discuss any questions you have with your health care provider. Document Revised: 05/15/2018 Document Reviewed: 02/20/2018 [employer] Patient Education  2021 [employer].

## 2020-03-08 ENCOUNTER — Other Ambulatory Visit: Payer: Self-pay | Admitting: Nurse Practitioner

## 2020-03-08 DIAGNOSIS — M79622 Pain in left upper arm: Secondary | ICD-10-CM

## 2020-03-24 ENCOUNTER — Other Ambulatory Visit: Payer: Self-pay | Admitting: Nurse Practitioner

## 2020-03-24 ENCOUNTER — Ambulatory Visit
Admission: RE | Admit: 2020-03-24 | Discharge: 2020-03-24 | Disposition: A | Discharge status: Home / Self Care | Payer: 59 | Source: Ambulatory Visit | Attending: Nurse Practitioner | Admitting: Nurse Practitioner

## 2020-03-24 ENCOUNTER — Other Ambulatory Visit: Payer: Self-pay

## 2020-03-24 DIAGNOSIS — M79622 Pain in left upper arm: Secondary | ICD-10-CM

## 2020-04-18 ENCOUNTER — Encounter: Payer: 59 | Admitting: Nurse Practitioner

## 2020-05-08 ENCOUNTER — Encounter: Payer: Self-pay | Admitting: Nurse Practitioner

## 2020-05-08 ENCOUNTER — Ambulatory Visit (INDEPENDENT_AMBULATORY_CARE_PROVIDER_SITE_OTHER): Payer: 59 | Admitting: Nurse Practitioner

## 2020-05-08 ENCOUNTER — Other Ambulatory Visit: Payer: Self-pay

## 2020-05-08 VITALS — BP 136/82 | HR 64 | Temp 97.0°F | Ht 67.25 in | Wt 241.2 lb

## 2020-05-08 DIAGNOSIS — G620 Drug-induced polyneuropathy: Secondary | ICD-10-CM | POA: Diagnosis not present

## 2020-05-08 DIAGNOSIS — R7989 Other specified abnormal findings of blood chemistry: Secondary | ICD-10-CM

## 2020-05-08 DIAGNOSIS — Z13220 Encounter for screening for lipoid disorders: Secondary | ICD-10-CM | POA: Diagnosis not present

## 2020-05-08 DIAGNOSIS — D500 Iron deficiency anemia secondary to blood loss (chronic): Secondary | ICD-10-CM

## 2020-05-08 DIAGNOSIS — Z0001 Encounter for general adult medical examination with abnormal findings: Secondary | ICD-10-CM

## 2020-05-08 DIAGNOSIS — T451X5A Adverse effect of antineoplastic and immunosuppressive drugs, initial encounter: Secondary | ICD-10-CM

## 2020-05-08 DIAGNOSIS — Z136 Encounter for screening for cardiovascular disorders: Secondary | ICD-10-CM | POA: Diagnosis not present

## 2020-05-08 DIAGNOSIS — Z6837 Body mass index (BMI) 37.0-37.9, adult: Secondary | ICD-10-CM | POA: Diagnosis not present

## 2020-05-08 DIAGNOSIS — Z1211 Encounter for screening for malignant neoplasm of colon: Secondary | ICD-10-CM

## 2020-05-08 LAB — COMPREHENSIVE METABOLIC PANEL
ALT: 79 U/L — ABNORMAL HIGH (ref 0–35)
AST: 53 U/L — ABNORMAL HIGH (ref 0–37)
Albumin: 4 g/dL (ref 3.5–5.2)
Alkaline Phosphatase: 53 U/L (ref 39–117)
BUN: 11 mg/dL (ref 6–23)
CO2: 28 mEq/L (ref 19–32)
Calcium: 9.4 mg/dL (ref 8.4–10.5)
Chloride: 104 mEq/L (ref 96–112)
Creatinine, Ser: 0.62 mg/dL (ref 0.40–1.20)
GFR: 105.17 mL/min (ref >60.00)
Glucose, Bld: 89 mg/dL (ref 70–99)
Potassium: 4.3 mEq/L (ref 3.5–5.1)
Sodium: 139 mEq/L (ref 135–145)
Total Bilirubin: 0.9 mg/dL (ref 0.2–1.2)
Total Protein: 7.2 g/dL (ref 6.0–8.3)

## 2020-05-08 LAB — LIPID PANEL
Cholesterol: 174 mg/dL (ref 0–200)
HDL: 45 mg/dL (ref >39.00)
LDL Cholesterol: 103 mg/dL — ABNORMAL HIGH (ref 0–99)
NonHDL: 129.45
Total CHOL/HDL Ratio: 4
Triglycerides: 130 mg/dL (ref 0.0–149.0)
VLDL: 26 mg/dL (ref 0.0–40.0)

## 2020-05-08 LAB — CBC WITH DIFFERENTIAL/PLATELET
Basophils Absolute: 0 10*3/uL (ref 0.0–0.1)
Basophils Relative: 0.6 % (ref 0.0–3.0)
Eosinophils Absolute: 0.2 10*3/uL (ref 0.0–0.7)
Eosinophils Relative: 4.3 % (ref 0.0–5.0)
HCT: 42.7 % (ref 36.0–46.0)
Hemoglobin: 13.9 g/dL (ref 12.0–15.0)
Lymphocytes Relative: 42.2 % (ref 12.0–46.0)
Lymphs Abs: 2.1 10*3/uL (ref 0.7–4.0)
MCHC: 32.6 g/dL (ref 30.0–36.0)
MCV: 73 fl — ABNORMAL LOW (ref 78.0–100.0)
Monocytes Absolute: 0.4 10*3/uL (ref 0.1–1.0)
Monocytes Relative: 7.9 % (ref 3.0–12.0)
Neutro Abs: 2.2 10*3/uL (ref 1.4–7.7)
Neutrophils Relative %: 45 % (ref 43.0–77.0)
Platelets: 164 10*3/uL (ref 150.0–400.0)
RBC: 5.85 Mil/uL — ABNORMAL HIGH (ref 3.87–5.11)
RDW: 16.9 % — ABNORMAL HIGH (ref 11.5–15.5)
WBC: 5 10*3/uL (ref 4.0–10.5)

## 2020-05-08 LAB — HEMOGLOBIN A1C: Hgb A1c MFr Bld: 6 % (ref 4.6–6.5)

## 2020-05-08 LAB — VITAMIN B12: Vitamin B-12: 230 pg/mL (ref 211–911)

## 2020-05-08 LAB — TSH: TSH: 3.35 u[IU]/mL (ref 0.35–4.50)

## 2020-05-08 MED ORDER — GABAPENTIN 100 MG PO CAPS: ORAL_CAPSULE | ORAL | 5 refills | Status: DC

## 2020-05-08 NOTE — Patient Instructions (Addendum)
It is important to maintain heart healthy diet and regular exercise (3x/week, 30-75mns of brisk walking).  Schedule appt for annual eye exam, dental cleaning every 638month and annual appt with oncology.  You will be contacted to schedule appt with GI  Go to lab for blood draw  Preventive Care [id number] Old, Female Preventive care refers to lifestyle choices and visits with your health care provider that can promote health and wellness. This includes:  A yearly physical exam. This is also called an annual wellness visit.  Regular dental and eye exams.  Immunizations.  Screening for certain conditions.  Healthy lifestyle choices, such as: ? Eating a healthy diet. ? Getting regular exercise. ? Not using drugs or products that contain nicotine and tobacco. ? Limiting alcohol use. What can I expect for my preventive care visit? Physical exam Your health care provider will check your:  Height and weight. These may be used to calculate your BMI (body mass index). BMI is a measurement that tells if you are at a healthy weight.  Heart rate and blood pressure.  Body temperature.  Skin for abnormal spots. Counseling Your health care provider may ask you questions about your:  Past medical problems.  Family's medical history.  Alcohol, tobacco, and drug use.  Emotional well-being.  Home life and relationship well-being.  Sexual activity.  Diet, exercise, and sleep habits.  Work and work enStatistician Access to firearms.  Method of birth control.  Menstrual cycle.  Pregnancy history. What immunizations do I need? Vaccines are usually given at various ages, according to a schedule. Your health care provider will recommend vaccines for you based on your age, medical history, and lifestyle or other factors, such as travel or where you work.   What tests do I need? Blood tests  Lipid and cholesterol levels. These may be checked every 5 years, or more often if you  are over 4978ears old.  Hepatitis C test.  Hepatitis B test. Screening  Lung cancer screening. You may have this screening every year starting at age 4956f you have a 30-pack-year history of smoking and currently smoke or have quit within the past 15 years.  Colorectal cancer screening. ? All adults should have this screening starting at age 4959nd continuing until age 49? Your health care provider may recommend screening at age 8052f you are at increased risk. ? You will have tests every 1-10 years, depending on your results and the type of screening test.  Diabetes screening. ? This is done by checking your blood sugar (glucose) after you have not eaten for a while (fasting). ? You may have this done every 1-3 years.  Mammogram. ? This may be done every 1-2 years. ? Talk with your health care provider about when you should start having regular mammograms. This may depend on whether you have a family history of breast cancer.  BRCA-related cancer screening. This may be done if you have a family history of breast, ovarian, tubal, or peritoneal cancers.  Pelvic exam and Pap test. ? This may be done every 3 years starting at age 49? Starting at age 9832this may be done every 5 years if you have a Pap test in combination with an HPV test. Other tests  STD (sexually transmitted disease) testing, if you are at risk.  Bone density scan. This is done to screen for osteoporosis. You may have this scan if you are at high risk for osteoporosis. Talk with  your health care provider about your test results, treatment options, and if necessary, the need for more tests. Follow these instructions at home: Eating and drinking  Eat a diet that includes fresh fruits and vegetables, whole grains, lean protein, and low-fat dairy products.  Take vitamin and mineral supplements as recommended by your health care provider.  Do not drink alcohol if: ? Your health care provider tells you not to  drink. ? You are pregnant, may be pregnant, or are planning to become pregnant.  If you drink alcohol: ? Limit how much you have to 0-1 drink a day. ? Be aware of how much alcohol is in your drink. In the [place] one drink equals one 12 oz bottle of beer (355 mL), one 5 oz glass of wine (148 mL), or one 1 oz glass of hard liquor (44 mL).   Lifestyle  Take daily care of your teeth and gums. Brush your teeth every morning and night with fluoride toothpaste. Floss one time each day.  Stay active. Exercise for at least 30 minutes 5 or more days each week.  Do not use any products that contain nicotine or tobacco, such as cigarettes, e-cigarettes, and chewing tobacco. If you need help quitting, ask your health care provider.  Do not use drugs.  If you are sexually active, practice safe sex. Use a condom or other form of protection to prevent STIs (sexually transmitted infections).  If you do not wish to become pregnant, use a form of birth control. If you plan to become pregnant, see your health care provider for a prepregnancy visit.  If told by your health care provider, take low-dose aspirin daily starting at age 46.  Find healthy ways to cope with stress, such as: ? Meditation, yoga, or listening to music. ? Journaling. ? Talking to a trusted person. ? Spending time with friends and family. Safety  Always wear your seat belt while driving or riding in a vehicle.  Do not drive: ? If you have been drinking alcohol. Do not ride with someone who has been drinking. ? When you are tired or distracted. ? While texting.  Wear a helmet and other protective equipment during sports activities.  If you have firearms in your house, make sure you follow all gun safety procedures. What's next?  Visit your health care provider once a year for an annual wellness visit.  Ask your health care provider how often you should have your eyes and teeth checked.  Stay up to date on all  vaccines. This information is not intended to replace advice given to you by your health care provider. Make sure you discuss any questions you have with your health care provider. Document Revised: 10/26/2019 Document Reviewed: 10/02/2017 Elsevier Patient Education  2021 [employer].

## 2020-05-08 NOTE — Assessment & Plan Note (Signed)
Worsening. Check iron, B12 and TSH. Start gabapentin: tittrate dose to 100mg  BID

## 2020-05-08 NOTE — Assessment & Plan Note (Signed)
Repeat cbc and iron panel No melena or hematochezia S/p hysterectomy

## 2020-05-08 NOTE — Progress Notes (Signed)
Subjective:    Patient ID: Susan Morrison, female    DOB: 18-Nov-1970, 50 y.o.   MRN: [id number]  Patient presents today for CPE and eval of chronic conditions  HPI Chemotherapy-induced peripheral neuropathy (HCC) Worsening. Check iron, B12 and TSH. Start gabapentin: tittrate dose to 134m BID  Iron deficiency anemia due to chronic blood loss Repeat cbc and iron panel No melena or hematochezia S/p hysterectomy  BP Readings from Last 3 Encounters:  05/08/20 136/82  03/07/20 130/82  09/24/19 104/78   Sexual History (orientation,birth control, marital status, STD): denies need for pelvic exam and STD screen. Up to date with annual mammogram and MRI  Depression/Suicide:counseling session through church [facility] 05/08/2020 03/26/2018 02/12/2017 11/14/2016 03/05/2016  Decreased Interest 2 0 0 0 0  Down, Depressed, Hopeless 0 0 0 0 0  PHQ - 2 Score 2 0 0 0 0  Altered sleeping 0 - - - -  Tired, decreased energy 3 - - - -  Change in appetite 3 - - - -  Feeling bad or failure about yourself  0 - - - -  Trouble concentrating 2 - - - -  Moving slowly or fidgety/restless 0 - - - -  Suicidal thoughts 0 - - - -  PHQ-9 Score 10 - - - -  Difficult doing work/chores Very difficult - - - -   GAD 7 : Generalized Anxiety Score 05/08/2020  Nervous, Anxious, on Edge 0  Control/stop worrying 2  Worry too much - different things 2  Trouble relaxing 0  Restless 0  Easily annoyed or irritable 2  Afraid - awful might happen 0  Total GAD 7 Score 6  Anxiety Difficulty Very difficult   Vision:will schedule  Dental:will schedule  Immunizations: (TDAP, Hep C screen, Pneumovax, Influenza, zoster)  Health Maintenance  Topic Date Due  . Colon Cancer Screening  Never done  . Tetanus Vaccine  02/05/2019  . COVID-19 Vaccine (3 - Pfizer risk 4-dose series) 07/05/2019  .  Hepatitis C: One time screening is recommended by Center for Disease Control  (CDC) for  adults born from [birth year] through 1964.   05/08/2021*  . Flu Shot  09/04/2020  . Pap Smear  03/05/2021  . HIV Screening  Completed  . HPV Vaccine  Aged Out  *Topic was postponed. The date shown is not the original due date.   Diet:regular. Exercise: none Weight:  Wt Readings from Last 3 Encounters:  05/08/20 241 lb 3.2 oz (109.4 kg)  03/07/20 240 lb (108.9 kg)  02/14/20 235 lb (106.6 kg)   Fall Risk: Fall Risk  05/08/2020 03/26/2018 11/14/2017 02/12/2017 11/14/2016 03/05/2016  Falls in the past year? 0 0 No No No No  Number falls in past yr: 0 - - - - -  Injury with Fall? 0 - - - - -  Risk for fall due to : No Fall Risks - - - - -  Follow up Falls evaluation completed - - - - -   Medications and allergies reviewed with patient and updated if appropriate.  Patient Active Problem List   Diagnosis Date Noted  . S/P laparoscopic hysterectomy 03/05/2018  . Status post laparoscopic hysterectomy 03/05/2018  . Closed displaced fracture of right great toe 01/21/2018  . Uterine leiomyoma 10/08/2017  . Chemotherapy-induced peripheral neuropathy ([place] 08/15/2017  . Port catheter in place 06/11/2016  . Genetic testing 06/03/2016  . Keratoconjunctivitis of both eyes 06/03/2016  . UPJ obstruction, congenital 05/22/2016  .  Iron deficiency anemia due to chronic blood loss 04/03/2016  . Malignant neoplasm of lower-inner quadrant of left breast in female, estrogen receptor negative ([place]) 03/26/2016  . Left breast mass 03/05/2016    Current Outpatient Medications on File Prior to Visit  Medication Sig Dispense Refill  . acetaminophen (TYLENOL) 500 MG tablet Take 1,000 mg by mouth every 6 (six) hours as needed (for pain.).     Marland Kitchen albuterol (VENTOLIN HFA) 108 (90 Base) MCG/ACT inhaler Inhale 1-2 puffs into the lungs every 6 (six) hours as needed for wheezing or shortness of breath. 8 g 0  . Multiple Vitamins-Minerals (MULTIVITAMIN WITH MINERALS) tablet Take 1 tablet by mouth daily.     No current facility-administered  medications on file prior to visit.    Past Medical History:  Diagnosis Date  . Anemia   . Arthritis   . Breast mass 03/21/2016   left 7:00  . Cancer ([place])    left brest cancer   . Genetic testing 06/03/2016   Ms. Penza underwent genetic counseling and testing for hereditary cancer syndromes on 05/15/2016. Her results were negative for mutations in all 46 genes analyzed by [facility]'s 46-gene Common Hereditary Cancers Panel. Genes analyzed include: APC, ATM, AXIN2, BARD1, BMPR1A, BRCA1, BRCA2, BRIP1, CDH1, CDKN2A, CHEK2, CTNNA1, DICER1, EPCAM, GREM1, HOXB13, KIT, MEN1, MLH1, MSH2, MSH3, MSH6, MUTYH, NBN,  . History of blood transfusion   . History of kidney stones   . History of radiation therapy 12/04/16-01/14/17   left breast 50.4 Gy in 28 fractions  . Personal history of chemotherapy    March-June 2018  . Personal history of radiation therapy    Oct-Dec 2018  . PONV (postoperative nausea and vomiting)     Past Surgical History:  Procedure Laterality Date  . ABDOMINAL HYSTERECTOMY  03/05/2018  . BREAST LUMPECTOMY Left 2018  . CYSTOSCOPY N/A 03/05/2018   Procedure: CYSTOSCOPY;  Surgeon: Sherlyn Hay, DO;  [facility];  Service: Gynecology;  Laterality: N/A;  . DILATION AND CURETTAGE OF UTERUS     last done 2years ago  . PORT-A-CATH REMOVAL     12/2016   . PORTACATH PLACEMENT N/A 04/15/2016   Procedure: INSERTION PORT-A-CATH WITH [place];  Surgeon: Alphonsa Overall, MD;  Location: [facility];  Service: General;  Laterality: N/A;  . RADIOACTIVE SEED GUIDED AXILLARY SENTINEL LYMPH NODE Left 10/24/2016   Procedure: RADIOACTIVE SEED GUIDED AXILLARY TARGETED SENTINEL LYMPH NODE DISSECTION;  Surgeon: Alphonsa Overall, MD;  Location: [place];  Service: General;  Laterality: Left;  . RADIOACTIVE SEED GUIDED EXCISIONAL BREAST BIOPSY Bilateral 10/24/2016   Procedure: RIGHT BREAST RADIOACTIVE SEED GUIDED EXCISIONAL BREAST BIOPSIES X'S 2, LEFT BREAST  RADIOACTIVE SEED GUIDED EXCISIONAL BIOPSY X2;  Surgeon: Alphonsa Overall, MD;  Location: [place];  Service: General;  Laterality: Bilateral;  . ROBOT ASSISTED PYELOPLASTY Left 03/05/2017   Procedure: XI ROBOTIC ASSISTED PYELOPLASTY;  Surgeon: Ardis Hughs, MD;  Location: [facility];  Service: Urology;  Laterality: Left;  . TOTAL LAPAROSCOPIC HYSTERECTOMY WITH SALPINGECTOMY Bilateral 03/05/2018   Procedure: TOTAL LAPAROSCOPIC HYSTERECTOMY WITH SALPINGECTOMY, poss open;  Surgeon: Sherlyn Hay, DO;  Location: [place];  Service: Gynecology;  Laterality: Bilateral;  Poss Open Hysterectomy  . TUBAL LIGATION  1996    Social History   Socioeconomic History  . Marital status: Married    Spouse name: Not on file  . Number of children: 3  . Years of education: 48  . Highest education level:  Not on file  Occupational History  . Occupation: Best boy: [employer]  Tobacco Use  . Smoking status: Never Smoker  . Smokeless tobacco: Never Used  Vaping Use  . Vaping Use: Never used  Substance and Sexual Activity  . Alcohol use: Yes    Comment: wine/social  . Drug use: No  . Sexual activity: Yes    Birth control/protection: Surgical  Other Topics Concern  . Not on file  Social History Narrative   Lives with fiancee in a one story home.     Works as a Freight forwarder for [employer].     Education: 2 years of college.    Social Determinants of Health   Financial Resource Strain: Not on file  Food Insecurity: Not on file  Transportation Needs: Not on file  Physical Activity: Not on file  Stress: Not on file  Social Connections: Not on file    Family History  Problem Relation Age of Onset  . Hypertension Mother   . Diabetes Mother   . Breast cancer Paternal Grandmother 18       d.62 bilateral breast cancer  . Other Father        Homicide       Review of Systems  Constitutional: Negative for fever, malaise/fatigue and weight loss.   HENT: Negative for congestion and sore throat.   Eyes:       Negative for visual changes  Respiratory: Negative for cough and shortness of breath.   Cardiovascular: Negative for chest pain, palpitations and leg swelling.  Gastrointestinal: Negative for blood in stool, constipation, diarrhea and heartburn.  Genitourinary: Negative for dysuria, frequency and urgency.  Musculoskeletal: Negative for falls, joint pain and myalgias.  Skin: Negative for rash.  Neurological: Positive for sensory change. Negative for dizziness, focal weakness, weakness and headaches.  Endo/Heme/Allergies: Does not bruise/bleed easily.  Psychiatric/Behavioral: Positive for depression. Negative for hallucinations, substance abuse and suicidal ideas. The patient is nervous/anxious. The patient does not have insomnia.     Objective:   Vitals:   05/08/20 1104  BP: 136/82  Pulse: 64  Temp: (!) 97 F (36.1 C)  SpO2: 99%    Body mass index is 37.5 kg/m.   Physical Examination:  Physical Exam Constitutional:      Appearance: She is obese.  HENT:     Right Ear: Tympanic membrane, ear canal and external ear normal.     Left Ear: Tympanic membrane, ear canal and external ear normal.  Eyes:     Extraocular Movements: Extraocular movements intact.     Conjunctiva/sclera: Conjunctivae normal.  Cardiovascular:     Rate and Rhythm: Normal rate and regular rhythm.     Pulses: Normal pulses.     Heart sounds: Normal heart sounds.  Pulmonary:     Effort: Pulmonary effort is normal.     Breath sounds: Normal breath sounds.  Abdominal:     General: Bowel sounds are normal.     Palpations: Abdomen is soft.  Genitourinary:    Comments: Deferred breast and pelvic exam to oncology and GYN respectively Musculoskeletal:        General: Normal range of motion.     Cervical back: Normal range of motion and neck supple.     Right lower leg: Edema present.     Left lower leg: Edema present.  Lymphadenopathy:      Cervical: No cervical adenopathy.  Skin:    General: Skin is warm and dry.  Findings: No erythema or rash.  Neurological:     Mental Status: She is oriented to person, place, and time.  Psychiatric:        Mood and Affect: Mood normal.        Behavior: Behavior normal.        Thought Content: Thought content normal.     ASSESSMENT and PLAN: This visit occurred during the SARS-CoV-2 public health emergency.  Safety protocols were in place, including screening questions prior to the visit, additional usage of staff PPE, and extensive cleaning of exam room while observing appropriate contact time as indicated for disinfecting solutions.   Tajae was seen today for annual exam.  Diagnoses and all orders for this visit:  Encounter for preventative adult health care exam with abnormal findings -     Comprehensive metabolic panel -     Lipid panel -     Ambulatory referral to Gastroenterology  Iron deficiency anemia due to chronic blood loss -     CBC with Differential/Platelet -     Iron, TIBC and Ferritin Panel  Encounter for lipid screening for cardiovascular disease -     Lipid panel  Chemotherapy-induced peripheral neuropathy (HCC) -     gabapentin (NEURONTIN) 100 MG capsule; 1cap at hs x 1week, then 1cap BID continous -     B12 -     TSH  Colon cancer screening -     Ambulatory referral to Gastroenterology  Class 2 severe obesity due to excess calories with serious comorbidity and body mass index (BMI) of 37.0 to 37.9 in adult ([place]) -     Hemoglobin A1c       Problem List Items Addressed This Visit      Nervous and Auditory   Chemotherapy-induced peripheral neuropathy (HCC)    Worsening. Check iron, B12 and TSH. Start gabapentin: tittrate dose to 180m BID      Relevant Medications   gabapentin (NEURONTIN) 100 MG capsule   Other Relevant Orders   B12   TSH     Other   Iron deficiency anemia due to chronic blood loss    Repeat cbc and iron panel No  melena or hematochezia S/p hysterectomy      Relevant Orders   CBC with Differential/Platelet   Iron, TIBC and Ferritin Panel    Other Visit Diagnoses    Encounter for preventative adult health care exam with abnormal findings    -  Primary   Relevant Orders   Comprehensive metabolic panel   Lipid panel   Ambulatory referral to Gastroenterology   Encounter for lipid screening for cardiovascular disease       Relevant Orders   Lipid panel   Colon cancer screening       Relevant Orders   Ambulatory referral to Gastroenterology   Class 2 severe obesity due to excess calories with serious comorbidity and body mass index (BMI) of 37.0 to 37.9 in adult ([facility]       Relevant Orders   Hemoglobin A1c      Follow up: Return in about 6 months (around 11/07/2020) for weight management and BP.  CWilfred Lacy NP

## 2020-05-09 LAB — IRON,TIBC AND FERRITIN PANEL
%SAT: 31 % (calc) (ref 16–45)
Ferritin: 192 ng/mL (ref 16–232)
Iron: 84 ug/dL (ref 40–190)
TIBC: 275 mcg/dL (calc) (ref 250–450)

## 2020-05-09 NOTE — Progress Notes (Incomplete)
 Patient Care Team: Nche, Charlotte Lum, NP as PCP - General (Internal Medicine) Newman, David, MD as Consulting Physician (General Surgery) Gudena, Vinay, MD as Consulting Physician (Hematology and Oncology) Squire, Sarah, MD as Attending Physician (Radiation Oncology) Causey, Lindsey Cornetto, NP as Nurse Practitioner (Hematology and Oncology)  DIAGNOSIS: No diagnosis found.  SUMMARY OF ONCOLOGIC HISTORY: Oncology History  Malignant neoplasm of lower-inner quadrant of left breast in female, estrogen receptor negative (HCC)  03/22/2016 Initial Diagnosis   Left breast biopsy 8:00: IDC high-grade with necrosis, left axillary lymph node biopsy IDC, ER 0%, PR 0%, HER-2 negative ratio 1.42, Ki-67 90%; palpable lump 3.5 x 2.6 x 2.2 cm and 2.8 Center left axillary lymph node, T2 N1 stage IIB   04/10/2016 Initial Biopsy   Additional biopsies left breast 9:30: Sclerosed intraductal papilloma with every 3 hours, right breast 1:00: Complex sclerosing lesion with UDH, right breast 8:00: Complex sclerosing lesion with UDH   04/17/2016 - 08/28/2016 Neo-Adjuvant Chemotherapy   Dose dense Adriamycin and Cytoxan 4 followed by Taxol weekly 12   05/15/2016 Genetic Testing   Genetic counseling and testing for hereditary cancer syndromes performed on 05/15/2016. Results are negative for pathogenic mutations in 46 genes analyzed by [facility]'s Common Hereditary Cancers Panel. Results are dated 05/24/2016. Genes tested: APC, ATM, AXIN2, BARD1, BMPR1A, BRCA1, BRCA2, BRIP1, CDH1, CDKN2A, CHEK2, CTNNA1, DICER1, EPCAM, GREM1, HOXB13, KIT, MEN1, MLH1, MSH2, MSH3, MSH6, MUTYH, NBN, NF1, NTHL1, PALB2, PDGFRA, PMS2, POLD1, POLE, PTEN, RAD50, RAD51C, RAD51D, SDHA, SDHB, SDHC, SDHD, SMAD4, SMARCA4, STK11, TP53, TSC1, TSC2, and VHL.  A variant of uncertain significance (not clinically actionable) was noted in MUTYH.      10/24/2016 Surgery   Right lumpectomy: No malignancy identified. Complete pathologic response. 0/4 lymph  nodes negative, previously triple negative disease with a Ki-67 of 90%   12/04/2016 - 01/14/2017 Radiation Therapy   Adjuvant radiation therapy     CHIEF COMPLIANT: Follow-up of right breast cancer  INTERVAL HISTORY: Susan Morrison is a 50 y.o. 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 She is currently on surveillance. Breast MRI on 01/10/20 showed no evidence of malignancy. Mammogram on 03/24/20 showed no evidence of malignancy bilaterally. She presents to the clinic today for annual follow-up.  ALLERGIES:  is allergic to ibuprofen.  MEDICATIONS:  Current Outpatient Medications  Medication Sig Dispense Refill  . acetaminophen (TYLENOL) 500 MG tablet Take 1,000 mg by mouth every 6 (six) hours as needed (for pain.).     . albuterol (VENTOLIN HFA) 108 (90 Base) MCG/ACT inhaler Inhale 1-2 puffs into the lungs every 6 (six) hours as needed for wheezing or shortness of breath. 8 g 0  . gabapentin (NEURONTIN) 100 MG capsule 1cap at hs x 1week, then 1cap BID continous 60 capsule 5  . Multiple Vitamins-Minerals (MULTIVITAMIN WITH MINERALS) tablet Take 1 tablet by mouth daily.     No current facility-administered medications for this visit.    PHYSICAL EXAMINATION: ECOG PERFORMANCE STATUS: {CHL ONC ECOG PS:[id number]}  There were no vitals filed for this visit. There were no vitals filed for this visit.  BREAST:*** No palpable masses or nodules in either right or left breasts. No palpable axillary supraclavicular or infraclavicular adenopathy no breast tenderness or nipple discharge. (exam performed in the presence of a chaperone)  LABORATORY DATA:  I have reviewed the data as listed CMP Latest Ref Rng & Units 05/08/2020 03/26/2018 09/11/2017  Glucose 70 - 99 mg/dL 89 89 98    BUN 6 - 23 mg/dL _0 Creatinine 0.40 - 1.20 mg/dL 0.62 0.71 0.66  Sodium 135 - 145 mEq/L 139 138 140  Potassium 3.5 - 5.1  mEq/L 4.3 4.3 4.9  Chloride 96 - 112 mEq/L 104 104 105  CO2 19 - 32 mEq/L _1 Calcium 8.4 - 10.5 mg/dL 9.4 9.3 9.3  Total Protein 6.0 - 8.3 g/dL 7.2 7.1 6.9  Total Bilirubin 0.2 - 1.2 mg/dL 0.9 0.6 0.7  Alkaline Phos 39 - 117 U/L 53 71 95  AST 0 - 37 U/L 53(H) 31 29  ALT 0 - 35 U/L 79(H) 41(H) 34    Lab Results  Component Value Date   WBC 5.0 05/08/2020   HGB 13.9 05/08/2020   HCT 42.7 05/08/2020   MCV 73.0 (L) 05/08/2020   PLT 164.0 05/08/2020   NEUTROABS 2.2 05/08/2020    ASSESSMENT & PLAN:  No problem-specific Assessment & Plan notes found for this encounter.    No orders of the defined types were placed in this encounter.  The patient has a good understanding of the overall plan. she agrees with it. she will call with any problems that may develop before the next visit here.  Total time spent: *** mins including face to face time and time spent for planning, charting and coordination of care  Rulon Eisenmenger, MD, MPH 05/09/2020  I, Molly Dorshimer, am acting as scribe for Dr. Nicholas Lose.  {insert scribe attestation}

## 2020-05-09 NOTE — Addendum Note (Signed)
Addended by: Leana Gamer on: 05/09/2020 08:48 AM   Modules accepted: Orders

## 2020-05-10 ENCOUNTER — Inpatient Hospital Stay: Payer: 59 | Admitting: Hematology and Oncology

## 2020-05-10 ENCOUNTER — Telehealth: Payer: Self-pay | Admitting: Hematology and Oncology

## 2020-05-10 NOTE — Assessment & Plan Note (Deleted)
Treatment summary: 1.03/22/2016: Left breast biopsy 8:00: IDC high-grade with necrosis, left axillary lymph node biopsy IDC, ER 0%, PR 0%, HER-2 negative ratio 1.42, Ki-67 90%; palpable lump 3.5 x 2.6 x 2.2 cm and 2.8 Center left axillary lymph node, T2 N1 stage IIB 2. Neoadjuvant chemotherapy with Adriamycin and Cytoxan dose dense 4 followed by Taxolweekly 12completed 08/30/2016 2.09/20/2018Right lumpectomy: No malignancy identified. Complete pathologic response. 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 Adjuvant radiation therapy 12/04/2016-01/14/2017 ------------------------------------------------------------------------------------------------------------------------------------------------------- Chemotherapy-induced neuropathy:much improved, no longer taking gabapentin or Cymbalta. Ankle pain: I discussed with her the importance of losing weight and exercising.  Breast cancer surveillance: 1.  Breast exam 05/10/2020: Benign 2.  Breast MRI 01/11/20: Benign, annual breast MRIs are recommended due to greater than 20% lifetime risk 3.  Mammogram Left 03/24/20: Benign, Bil mamm scheduled in April 2022  Return to clinic in 1 year for follow-up

## 2020-05-10 NOTE — Telephone Encounter (Signed)
R/s appt per 4/6 sch msg. Pt aware.  

## 2020-05-28 NOTE — Assessment & Plan Note (Signed)
1.03/22/2016: Left breast biopsy 8:00: IDC high-grade with necrosis, left axillary lymph node biopsy IDC, ER 0%, PR 0%, HER-2 negative ratio 1.42, Ki-67 90%; palpable lump 3.5 x 2.6 x 2.2 cm and 2.8 Center left axillary lymph node, T2 N1 stage IIB 2. Neoadjuvant chemotherapy with Adriamycin and Cytoxan dose dense 4 followed by Taxolweekly 12completed 08/30/2016 2.09/20/2018Right lumpectomy: No malignancy identified. Complete pathologic response. 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 Adjuvant radiation therapy 12/04/2016-01/14/2017 ------------------------------------------------------------------------------------------------------------------------------------------------------- Chemotherapy-induced neuropathy:much improved, no longer taking gabapentin or Cymbalta. Ankle pain: I discussed with her the importance of losing weight and exercising.  Breast cancer surveillance: 1.  Breast exam 05/28/20: Benign 2.  Breast MRI 01/11/2020: Benign, annual breast MRIs are recommended due to greater than 20% lifetime risk 3.  Mammogram 03/24/20: Benign  Return to clinic in 1 year for follow-up

## 2020-05-28 NOTE — Progress Notes (Signed)
Patient Care Team: Nche, Charlene Brooke, NP as PCP - General (Internal Medicine) Alphonsa Overall, MD as Consulting Physician (General Surgery) Nicholas Lose, MD as Consulting Physician (Hematology and Oncology) Eppie Gibson, MD as Attending Physician (Radiation Oncology) Gardenia Phlegm, NP as Nurse Practitioner (Hematology and Oncology)  DIAGNOSIS:    ICD-10-CM   1. Malignant neoplasm of lower-inner quadrant of left breast in female, estrogen receptor negative ([place])  C50.312    Z17.1     SUMMARY OF ONCOLOGIC HISTORY: Oncology History  Malignant neoplasm of lower-inner quadrant of left breast in female, estrogen receptor negative ([place])  03/22/2016 Initial Diagnosis   Left breast biopsy 8:00: IDC high-grade with necrosis, left axillary lymph node biopsy IDC, ER 0%, PR 0%, HER-2 negative ratio 1.42, Ki-67 90%; palpable lump 3.5 x 2.6 x 2.2 cm and 2.8 Center left axillary lymph node, T2 N1 stage IIB   04/10/2016 Initial Biopsy   Additional biopsies left breast 9:30: Sclerosed intraductal papilloma with every 3 hours, right breast 1:00: Complex sclerosing lesion with UDH, right breast 8:00: Complex sclerosing lesion with UDH   04/17/2016 - 08/28/2016 Neo-Adjuvant Chemotherapy   Dose dense Adriamycin and Cytoxan 4 followed by Taxol weekly 12   05/15/2016 Genetic Testing   Genetic counseling and testing for hereditary cancer syndromes performed on 05/15/2016. Results are negative for pathogenic mutations in 46 genes analyzed by [facility] Common Hereditary Cancers Panel. Results are dated 05/24/2016. Genes tested: APC, ATM, AXIN2, BARD1, BMPR1A, BRCA1, BRCA2, BRIP1, CDH1, CDKN2A, CHEK2, CTNNA1, DICER1, EPCAM, GREM1, HOXB13, KIT, MEN1, MLH1, MSH2, MSH3, MSH6, MUTYH, NBN, NF1, NTHL1, PALB2, PDGFRA, PMS2, POLD1, POLE, PTEN, RAD50, RAD51C, RAD51D, SDHA, SDHB, SDHC, SDHD, SMAD4, SMARCA4, STK11, TP53, TSC1, TSC2, and VHL.  A variant of uncertain significance (not clinically actionable) was  noted in MUTYH.      10/24/2016 Surgery   Right lumpectomy: No malignancy identified. Complete pathologic response. 0/4 lymph nodes negative, previously triple negative disease with a Ki-67 of 90%   12/04/2016 - 01/14/2017 Radiation Therapy   Adjuvant radiation therapy     CHIEF COMPLIANT: Follow-up of right breast cancer  INTERVAL HISTORY: Susan Morrison is a 50 y.o. 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 Breast MRI on 01/10/20 showed no evidence of malignancy. Mammogram and [place] on 03/24/20 showed no evidence of malignancy bilaterally. She presents to the clinic today for annual follow-up. She is currently on gabapentin because she continues to have the ankle pain and discomfort.  She was noted to have elevated hemoglobin A1c and is working hard to control her diet and eating better and exercising regularly.  ALLERGIES:  is allergic to ibuprofen.  MEDICATIONS:  Current Outpatient Medications  Medication Sig Dispense Refill  . acetaminophen (TYLENOL) 500 MG tablet Take 1,000 mg by mouth every 6 (six) hours as needed (for pain.).     Marland Kitchen albuterol (VENTOLIN HFA) 108 (90 Base) MCG/ACT inhaler Inhale 1-2 puffs into the lungs every 6 (six) hours as needed for wheezing or shortness of breath. 8 g 0  . gabapentin (NEURONTIN) 100 MG capsule 1cap at hs x 1week, then 1cap BID continous 60 capsule 5  . Multiple Vitamins-Minerals (MULTIVITAMIN WITH MINERALS) tablet Take 1 tablet by mouth daily.     No current facility-administered medications for this visit.    PHYSICAL EXAMINATION: ECOG PERFORMANCE STATUS: 1 - Symptomatic but completely ambulatory  Vitals:   05/29/20 0920  BP: 138/82  Pulse:  66  Resp: 17  Temp: 97.8 F (36.6 C)  SpO2: 99%   Filed Weights   05/29/20 0920  Weight: 241 lb 4.8 oz (109.5 kg)    BREAST: No palpable masses or nodules in either  right or left breasts. No palpable axillary supraclavicular or infraclavicular adenopathy no breast tenderness or nipple discharge. (exam performed in the presence of a chaperone)  LABORATORY DATA:  I have reviewed the data as listed CMP Latest Ref Rng & Units 05/08/2020 03/26/2018 09/11/2017  Glucose 70 - 99 mg/dL 89 89 98  BUN 6 - 23 mg/dL 11 14 11   Creatinine 0.40 - 1.20 mg/dL 0.62 0.71 0.66  Sodium 135 - 145 mEq/L 139 138 140  Potassium 3.5 - 5.1 mEq/L 4.3 4.3 4.9  Chloride 96 - 112 mEq/L 104 104 105  CO2 19 - 32 mEq/L 28 27 24   Calcium 8.4 - 10.5 mg/dL 9.4 9.3 9.3  Total Protein 6.0 - 8.3 g/dL 7.2 7.1 6.9  Total Bilirubin 0.2 - 1.2 mg/dL 0.9 0.6 0.7  Alkaline Phos 39 - 117 U/L 53 71 95  AST 0 - 37 U/L 53(H) 31 29  ALT 0 - 35 U/L 79(H) 41(H) 34    Lab Results  Component Value Date   WBC 5.0 05/08/2020   HGB 13.9 05/08/2020   HCT 42.7 05/08/2020   MCV 73.0 (L) 05/08/2020   PLT 164.0 05/08/2020   NEUTROABS 2.2 05/08/2020    ASSESSMENT & PLAN:  Malignant neoplasm of lower-inner quadrant of left breast in female, estrogen receptor negative ([place]) 1.03/22/2016: Left breast biopsy 8:00: IDC high-grade with necrosis, left axillary lymph node biopsy IDC, ER 0%, PR 0%, HER-2 negative ratio 1.42, Ki-67 90%; palpable lump 3.5 x 2.6 x 2.2 cm and 2.8 Center left axillary lymph node, T2 N1 stage IIB 2. Neoadjuvant chemotherapy with Adriamycin and Cytoxan dose dense 4 followed by Taxolweekly 12completed 08/30/2016 2.09/20/2018Right lumpectomy: No malignancy identified. Complete pathologic response. 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 Adjuvant radiation therapy 12/04/2016-01/14/2017 ------------------------------------------------------------------------------------------------------------------------------------------------------- Chemotherapy-induced neuropathy:much improved, no longer taking gabapentin or Cymbalta. Ankle pain: I  discussed with her the importance of losing weight and exercising.  Breast cancer surveillance: 1.  Breast exam 05/28/20: Benign 2.  Breast MRI 01/11/2020: Benign, annual breast MRIs are recommended due to greater than 20% lifetime risk 3.  Mammogram 03/24/20: Benign  Return to clinic in 1 year for follow-up     No orders of the defined types were placed in this encounter.  The patient has a good understanding of the overall plan. she agrees with it. she will call with any problems that may develop before the next visit here.  Total time spent: 20 mins including face to face time and time spent for planning, charting and coordination of care  Rulon Eisenmenger, MD, MPH 05/29/2020  I, Molly Dorshimer, am acting as scribe for Dr. Nicholas Lose.  I have reviewed the above documentation for accuracy and completeness, and I agree with the above.

## 2020-05-29 ENCOUNTER — Inpatient Hospital Stay: Payer: 59 | Attending: Hematology and Oncology | Admitting: Hematology and Oncology

## 2020-05-29 ENCOUNTER — Other Ambulatory Visit: Payer: Self-pay

## 2020-05-29 VITALS — BP 138/82 | HR 66 | Temp 97.8°F | Resp 17 | Ht 67.25 in | Wt 241.3 lb

## 2020-05-29 DIAGNOSIS — Z171 Estrogen receptor negative status [ER-]: Secondary | ICD-10-CM | POA: Diagnosis not present

## 2020-05-29 DIAGNOSIS — T451X5A Adverse effect of antineoplastic and immunosuppressive drugs, initial encounter: Secondary | ICD-10-CM | POA: Insufficient documentation

## 2020-05-29 DIAGNOSIS — C50312 Malignant neoplasm of lower-inner quadrant of left female breast: Secondary | ICD-10-CM | POA: Diagnosis not present

## 2020-05-29 DIAGNOSIS — Z79899 Other long term (current) drug therapy: Secondary | ICD-10-CM | POA: Insufficient documentation

## 2020-05-29 DIAGNOSIS — Z9189 Other specified personal risk factors, not elsewhere classified: Secondary | ICD-10-CM | POA: Diagnosis not present

## 2020-05-29 DIAGNOSIS — G620 Drug-induced polyneuropathy: Secondary | ICD-10-CM | POA: Diagnosis not present

## 2020-08-01 ENCOUNTER — Other Ambulatory Visit: Payer: Self-pay

## 2020-08-02 ENCOUNTER — Ambulatory Visit: Payer: 59 | Admitting: Family Medicine

## 2020-08-04 ENCOUNTER — Encounter: Payer: Self-pay | Admitting: Hematology and Oncology

## 2020-10-24 ENCOUNTER — Encounter: Payer: Self-pay | Admitting: Gastroenterology

## 2020-11-07 ENCOUNTER — Other Ambulatory Visit: Payer: Self-pay

## 2020-11-07 ENCOUNTER — Ambulatory Visit (INDEPENDENT_AMBULATORY_CARE_PROVIDER_SITE_OTHER): Payer: 59 | Admitting: Nurse Practitioner

## 2020-11-07 ENCOUNTER — Encounter: Payer: Self-pay | Admitting: Nurse Practitioner

## 2020-11-07 VITALS — BP 126/78 | HR 68 | Temp 96.9°F | Ht 66.5 in | Wt 246.4 lb

## 2020-11-07 DIAGNOSIS — R739 Hyperglycemia, unspecified: Secondary | ICD-10-CM

## 2020-11-07 DIAGNOSIS — Z23 Encounter for immunization: Secondary | ICD-10-CM

## 2020-11-07 DIAGNOSIS — R42 Dizziness and giddiness: Secondary | ICD-10-CM

## 2020-11-07 DIAGNOSIS — R7303 Prediabetes: Secondary | ICD-10-CM | POA: Insufficient documentation

## 2020-11-07 DIAGNOSIS — G620 Drug-induced polyneuropathy: Secondary | ICD-10-CM

## 2020-11-07 DIAGNOSIS — T451X5A Adverse effect of antineoplastic and immunosuppressive drugs, initial encounter: Secondary | ICD-10-CM

## 2020-11-07 DIAGNOSIS — Z6839 Body mass index (BMI) 39.0-39.9, adult: Secondary | ICD-10-CM

## 2020-11-07 LAB — POCT GLYCOSYLATED HEMOGLOBIN (HGB A1C): Hemoglobin A1C: 6 % — AB (ref 4.0–5.6)

## 2020-11-07 MED ORDER — GABAPENTIN 100 MG PO CAPS: ORAL_CAPSULE | ORAL | 5 refills | Status: DC

## 2020-11-07 NOTE — Assessment & Plan Note (Signed)
Reports difficulty with weight loss despite exercise regimen: cardio exercise 1-2hrs 3x/week. She has not made any dietary changes. Admits to skipping meals. She drinks 4-16oz bottles of water, 1 8oz cup of coffee and 1can of soda (mountain dew or pepsi)per day.  Provided information on how to count daily calories, and add resistance training to exercise regimen. Advised about her risk of DM with hgbA1c at 6.0%. Also entered referral to weight management clinic.

## 2020-11-07 NOTE — Progress Notes (Signed)
Subjective:  Patient ID: Susan Morrison, female    DOB: Feb 11, 1970  Age: 50 y.o. MRN: [id number]  CC: Follow-up (6 month f/u on BP and weight. /Tdap and flu vaccine given today. )  Dizziness This is a new problem. The current episode started 1 to 4 weeks ago. The problem occurs intermittently. The problem has been unchanged. Associated symptoms include vertigo. Pertinent negatives include no abdominal pain, anorexia, arthralgias, change in bowel habit, chest pain, chills, congestion, coughing, diaphoresis, fatigue, fever, headaches, joint swelling, myalgias, nausea, neck pain, numbness, rash, sore throat, swollen glands, urinary symptoms, visual change, vomiting or weakness. The symptoms are aggravated by bending. She has tried rest for the symptoms. The treatment provided significant relief.   Class 2 severe obesity due to excess calories with serious comorbidity and body mass index (BMI) of 39.0 to 39.9 in adult [facility]) Reports difficulty with weight loss despite exercise regimen: cardio exercise 1-2hrs 3x/week. She has not made any dietary changes. Admits to skipping meals. She drinks 4-16oz bottles of water, 1 8oz cup of coffee and 1can of soda (mountain dew or pepsi)per day.  Provided information on how to count daily calories, and add resistance training to exercise regimen. Advised about her risk of DM with hgbA1c at 6.0%. Also entered referral to weight management clinic. BP Readings from Last 3 Encounters:  11/07/20 126/78  05/29/20 138/82  05/08/20 136/82    Wt Readings from Last 3 Encounters:  11/07/20 246 lb 6.4 oz (111.8 kg)  05/29/20 241 lb 4.8 oz (109.5 kg)  05/08/20 241 lb 3.2 oz (109.4 kg)    Reviewed past Medical, Social and Family history today.  Outpatient Medications Prior to Visit  Medication Sig Dispense Refill   acetaminophen (TYLENOL) 500 MG tablet Take 1,000 mg by mouth every 6 (six) hours as needed (for pain.).      albuterol (VENTOLIN HFA) 108 (90 Base)  MCG/ACT inhaler Inhale 1-2 puffs into the lungs every 6 (six) hours as needed for wheezing or shortness of breath. 8 g 0   Multiple Vitamins-Minerals (MULTIVITAMIN WITH MINERALS) tablet Take 1 tablet by mouth daily.     gabapentin (NEURONTIN) 100 MG capsule 1cap at hs x 1week, then 1cap BID continous (Patient not taking: Reported on 11/07/2020) 60 capsule 5   No facility-administered medications prior to visit.   ROS See HPI  Objective:  BP 126/78 (BP Location: Right Arm, Patient Position: Sitting, Cuff Size: Large)   Pulse 68   Temp (!) 96.9 F (36.1 C) (Temporal)   Ht 5' 6.5" (1.689 m)   Wt 246 lb 6.4 oz (111.8 kg)   SpO2 97%   BMI 39.17 kg/m   Physical Exam Vitals reviewed.  Constitutional:      Appearance: She is obese.  HENT:     Right Ear: Tympanic membrane, ear canal and external ear normal.     Left Ear: Tympanic membrane, ear canal and external ear normal.  Eyes:     Extraocular Movements: Extraocular movements intact.     Conjunctiva/sclera: Conjunctivae normal.  Cardiovascular:     Rate and Rhythm: Normal rate and regular rhythm.     Pulses: Normal pulses.     Heart sounds: Normal heart sounds.  Pulmonary:     Effort: Pulmonary effort is normal.     Breath sounds: Normal breath sounds.  Musculoskeletal:     Cervical back: Normal range of motion and neck supple.     Right lower leg: No edema.  Left lower leg: No edema.  Lymphadenopathy:     Cervical: No cervical adenopathy.  Skin:    General: Skin is warm and dry.  Neurological:     Mental Status: She is alert and oriented to person, place, and time.  Psychiatric:        Mood and Affect: Mood normal.        Behavior: Behavior normal.        Thought Content: Thought content normal.   Assessment & Plan:  This visit occurred during the SARS-CoV-2 public health emergency.  Safety protocols were in place, including screening questions prior to the visit, additional usage of staff PPE, and extensive cleaning  of exam room while observing appropriate contact time as indicated for disinfecting solutions.   Susan Morrison was seen today for follow-up.  Diagnoses and all orders for this visit:  Class 2 severe obesity due to excess calories with serious comorbidity and body mass index (BMI) of 39.0 to 39.9 in adult (HCC) -     Amb Ref to Medical Weight Management  Need for diphtheria-tetanus-pertussis (Tdap) vaccine -     Tdap vaccine greater than or equal to 7yo IM  Flu vaccine need -     Flu Vaccine QUAD 6+ mos PF IM (Fluarix Quad PF)  Vertigo  Hyperglycemia -     POCT glycosylated hemoglobin (Hb A1C) -     Amb Ref to Medical Weight Management  Chemotherapy-induced peripheral neuropathy (HCC) -     gabapentin (NEURONTIN) 100 MG capsule; 1cap at hs x 1week, then 1cap BID continous  To help vertigo symptoms, increase oral hydration with water, avoid sodas, and minimize caffeine intake to 8oz per day. Schedule appt for colon cancer screen.  Problem List Items Addressed This Visit       Nervous and Auditory   Chemotherapy-induced peripheral neuropathy (HCC)   Relevant Medications   gabapentin (NEURONTIN) 100 MG capsule     Other   Class 2 severe obesity due to excess calories with serious comorbidity and body mass index (BMI) of 39.0 to 39.9 in adult [facility]) - Primary    Reports difficulty with weight loss despite exercise regimen: cardio exercise 1-2hrs 3x/week. She has not made any dietary changes. Admits to skipping meals. She drinks 4-16oz bottles of water, 1 8oz cup of coffee and 1can of soda (mountain dew or pepsi)per day.  Provided information on how to count daily calories, and add resistance training to exercise regimen. Advised about her risk of DM with hgbA1c at 6.0%. Also entered referral to weight management clinic.      Relevant Orders   Amb Ref to Medical Weight Management   Hyperglycemia   Relevant Orders   POCT glycosylated hemoglobin (Hb A1C) (Completed)   Amb Ref to  Medical Weight Management   Other Visit Diagnoses     Need for diphtheria-tetanus-pertussis (Tdap) vaccine       Relevant Orders   Tdap vaccine greater than or equal to 7yo IM (Completed)   Flu vaccine need       Relevant Orders   Flu Vaccine QUAD 6+ mos PF IM (Fluarix Quad PF) (Completed)   Vertigo           Follow-up: Return in about 3 months (around 02/07/2021) for CPE (fasting).  Wilfred Lacy, NP

## 2020-11-07 NOTE — Patient Instructions (Addendum)
You will be contacted to schedule appt with weight management clinic. Decrease daily caloric intake by 500 to promote weight loss of 1lb per week. To help vertigo symptoms, increase oral hydration with water, avoid sodas, and minimize caffeine intake to 8oz per day. Schedule appt for colon cancer screen. hgbA1c of 6.0% which indicates prediabetes. Make dietary changes: low carb, low fat, high fiber. Calorie Counting for Weight Loss Calories are units of energy. Your body needs a certain number of calories from food to keep going throughout the day. When you eat or drink more calories than your body needs, your body stores the extra calories mostly as fat. When you eat or drink fewer calories than your body needs, your body burns fat to get the energy it needs. Calorie counting means keeping track of how many calories you eat and drink each day. Calorie counting can be helpful if you need to lose weight. If you eat fewer calories than your body needs, you should lose weight. Ask your health care provider what a healthy weight is for you. For calorie counting to work, you will need to eat the right number of calories each day to lose a healthy amount of weight per week. A dietitian can help you figure out how many calories you need in a day and will suggest ways to reach your calorie goal. A healthy amount of weight to lose each week is usually 1-2 lb (0.5-0.9 kg). This usually means that your daily calorie intake should be reduced by 500-750 calories. Eating 1,200-1,500 calories a day can help most women lose weight. Eating 1,500-1,800 calories a day can help most men lose weight. What do I need to know about calorie counting? Work with your health care provider or dietitian to determine how many calories you should get each day. To meet your daily calorie goal, you will need to: Find out how many calories are in each food that you would like to eat. Try to do this before you eat. Decide how much of  the food you plan to eat. Keep a food log. Do this by writing down what you ate and how many calories it had. To successfully lose weight, it is important to balance calorie counting with a healthy lifestyle that includes regular activity. Where do I find calorie information? The number of calories in a food can be found on a Nutrition Facts label. If a food does not have a Nutrition Facts label, try to look up the calories online or ask your dietitian for help. Remember that calories are listed per serving. If you choose to have more than one serving of a food, you will have to multiply the calories per serving by the number of servings you plan to eat. For example, the label on a package of bread might say that a serving size is 1 slice and that there are 90 calories in a serving. If you eat 1 slice, you will have eaten 90 calories. If you eat 2 slices, you will have eaten 180 calories. How do I keep a food log? After each time that you eat, record the following in your food log as soon as possible: What you ate. Be sure to include toppings, sauces, and other extras on the food. How much you ate. This can be measured in cups, ounces, or number of items. How many calories were in each food and drink. The total number of calories in the food you ate. Keep your food log near you,  such as in a [employer] or on an app or website on your mobile phone. Some programs will calculate calories for you and show you how many calories you have left to meet your daily goal. What are some portion-control tips? Know how many calories are in a serving. This will help you know how many servings you can have of a certain food. Use a measuring cup to measure serving sizes. You could also try weighing out portions on a kitchen scale. With time, you will be able to estimate serving sizes for some foods. Take time to put servings of different foods on your favorite plates or in your favorite bowls and cups so  you know what a serving looks like. Try not to eat straight from a food's packaging, such as from a bag or box. Eating straight from the package makes it hard to see how much you are eating and can lead to overeating. Put the amount you would like to eat in a cup or on a plate to make sure you are eating the right portion. Use smaller plates, glasses, and bowls for smaller portions and to prevent overeating. Try not to multitask. For example, avoid watching TV or using your computer while eating. If it is time to eat, sit down at a table and enjoy your food. This will help you recognize when you are full. It will also help you be more mindful of what and how much you are eating. What are tips for following this plan? Reading food labels Check the calorie count compared with the serving size. The serving size may be smaller than what you are used to eating. Check the source of the calories. Try to choose foods that are high in protein, fiber, and vitamins, and low in saturated fat, trans fat, and sodium. Shopping Read nutrition labels while you shop. This will help you make healthy decisions about which foods to buy. Pay attention to nutrition labels for low-fat or fat-free foods. These foods sometimes have the same number of calories or more calories than the full-fat versions. They also often have added sugar, starch, or salt to make up for flavor that was removed with the fat. Make a grocery list of lower-calorie foods and stick to it. Cooking Try to cook your favorite foods in a healthier way. For example, try baking instead of frying. Use low-fat dairy products. Meal planning Use more fruits and vegetables. One-half of your plate should be fruits and vegetables. Include lean proteins, such as chicken, [place], and fish. Lifestyle Each week, aim to do one of the following: 150 minutes of moderate exercise, such as walking. 75 minutes of vigorous exercise, such as running. General  information Know how many calories are in the foods you eat most often. This will help you calculate calorie counts faster. Find a way of tracking calories that works for you. Get creative. Try different apps or programs if writing down calories does not work for you. What foods should I eat?  Eat nutritious foods. It is better to have a nutritious, high-calorie food, such as an avocado, than a food with few nutrients, such as a bag of potato chips. Use your calories on foods and drinks that will fill you up and will not leave you hungry soon after eating. Examples of foods that fill you up are nuts and nut butters, vegetables, lean proteins, and high-fiber foods such as whole grains. High-fiber foods are foods with more than 5 g of fiber per  serving. Pay attention to calories in drinks. Low-calorie drinks include water and unsweetened drinks. The items listed above may not be a complete list of foods and beverages you can eat. Contact a dietitian for more information. What foods should I limit? Limit foods or drinks that are not good sources of vitamins, minerals, or protein or that are high in unhealthy fats. These include: Candy. Other sweets. Sodas, specialty coffee drinks, alcohol, and juice. The items listed above may not be a complete list of foods and beverages you should avoid. Contact a dietitian for more information. How do I count calories when eating out? Pay attention to portions. Often, portions are much larger when eating out. Try these tips to keep portions smaller: Consider sharing a meal instead of getting your own. If you get your own meal, eat only half of it. Before you start eating, ask for a container and put half of your meal into it. When available, consider ordering smaller portions from the menu instead of full portions. Pay attention to your food and drink choices. Knowing the way food is cooked and what is included with the meal can help you eat fewer calories. If  calories are listed on the menu, choose the lower-calorie options. Choose dishes that include vegetables, fruits, whole grains, low-fat dairy products, and lean proteins. Choose items that are boiled, broiled, grilled, or steamed. Avoid items that are buttered, battered, fried, or served with cream sauce. Items labeled as crispy are usually fried, unless stated otherwise. Choose water, low-fat milk, unsweetened iced tea, or other drinks without added sugar. If you want an alcoholic beverage, choose a lower-calorie option, such as a glass of wine or light beer. Ask for dressings, sauces, and syrups on the side. These are usually high in calories, so you should limit the amount you eat. If you want a salad, choose a garden salad and ask for grilled meats. Avoid extra toppings such as bacon, cheese, or fried items. Ask for the dressing on the side, or ask for olive oil and vinegar or lemon to use as dressing. Estimate how many servings of a food you are given. Knowing serving sizes will help you be aware of how much food you are eating at restaurants. Where to find more information Centers for Disease Control and Prevention: http://www.wolf.info/ U.S. Department of Agriculture: http://www.wilson-mendoza.org/ Summary Calorie counting means keeping track of how many calories you eat and drink each day. If you eat fewer calories than your body needs, you should lose weight. A healthy amount of weight to lose per week is usually 1-2 lb (0.5-0.9 kg). This usually means reducing your daily calorie intake by 500-750 calories. The number of calories in a food can be found on a Nutrition Facts label. If a food does not have a Nutrition Facts label, try to look up the calories online or ask your dietitian for help. Use smaller plates, glasses, and bowls for smaller portions and to prevent overeating. Use your calories on foods and drinks that will fill you up and not leave you hungry shortly after a meal. This information is not intended to  replace advice given to you by your health care provider. Make sure you discuss any questions you have with your health care provider. Document Revised: 03/04/2019 Document Reviewed: 03/04/2019 How to Increase Your Level of Physical Activity Getting regular physical activity is important for your overall health and well-being. Most people do not get enough exercise. There are easy ways to increase your level of  physical activity, even if you have not been very active in the past or if you are just starting out. What are the benefits of physical activity? Physical activity has many short-term and long-term benefits. Being active on a regular basis can improve your physical and mental health as well as provide other benefits. Physical health benefits Helping you lose weight or maintain a healthy weight. Strengthening your muscles and bones. Reducing your risk of certain long-term (chronic) diseases, including heart disease, cancer, and diabetes. Being able to move around more easily and for longer periods of time without getting tired (increased endurance or stamina). Improving your ability to fight off illness (enhanced immunity). Being able to sleep better. Helping you stay healthy as you get older, including: Helping you stay mobile, or capable of walking and moving around. Preventing accidents, such as falls. Increasing life expectancy. Mental health benefits Boosting your mood and improving your self-esteem. Lowering your chance of having mental health problems, such as depression or anxiety. Helping you feel good about your body. Other benefits Finding new sources of fun and enjoyment. Meeting new people who share a common interest. Before you begin If you have a chronic illness or have not been active for a while, check with your health care provider about how to get started. Ask your health care provider what activities are safe for you. Start out slowly. Walking or doing some simple  chair exercises is a good place to start, especially if you have not been active before or for a long time. Set goals that you can work toward. Ask your health care provider how much exercise is best for you. In general, most adults should: Do moderate-intensity exercise for at least 150 minutes each week (30 minutes on most days of the week) or vigorous exercise for at least 75 minutes each week, or a combination of these. Moderate-intensity exercise can include walking at a quick pace, biking, yoga, water aerobics, or gardening. Vigorous exercise involves activities that take more effort, such as jogging or running, playing sports, swimming laps, or jumping rope. Do strength exercises on at least 2 days each week. This can include weight lifting, body weight exercises, and resistance-band exercises. How to be more physically active Make a plan  Try to find activities that you enjoy. You are more likely to commit to an exercise routine if it does not feel like a chore. If you have bone or joint problems, choose low-impact exercises, like walking or swimming. Use these tips for being successful with an exercise plan: Find a workout partner for accountability. Join a group or class, such as an aerobics class, cycling class, or sports team. Make family time active. Go for a walk, bike, or swim. Include a variety of exercises each week. Consider using a fitness tracker, such as a mobile phone app or a device worn like a watch, that will count the number of steps you take each day. Many people strive to reach 10,000 steps a day. Find ways to be active in your daily routines Besides your formal exercise plans, you can find ways to do physical activity during your daily routines, such as: Walking or biking to work or to the store. Taking the stairs instead of the elevator. Parking farther away from the door at work or at the store. Planning walking meetings. Walking around while you are on the  phone. Where to find more information Centers for Disease Control and Prevention: WorkDashboard.es President's Council on Fitness, Sports & Nutrition: www.fitness.gov  ChooseMyPlate: MassVoice.es Contact a health care provider if: You have headaches, muscle aches, or joint pain that is concerning. You feel dizzy or light-headed while exercising. You faint. You feel your heart skipping, racing, or fluttering. You have chest pain while exercising. Summary Exercise benefits your mind and body at any age, even if you are just starting out. If you have a chronic illness or have not been active for a while, check with your health care provider before increasing your physical activity. Choose activities that are safe and enjoyable for you. Ask your health care provider what activities are safe for you. Start slowly. Tell your health care provider if you have problems as you start to increase your activity level. This information is not intended to replace advice given to you by your health care provider. Make sure you discuss any questions you have with your health care provider. Document Revised: 05/19/2020 Document Reviewed: 05/19/2020 Elsevier Patient Education  [employer] Patient Education  2022 [employer].

## 2020-12-12 ENCOUNTER — Other Ambulatory Visit: Payer: Self-pay | Admitting: Hematology and Oncology

## 2020-12-12 DIAGNOSIS — Z1231 Encounter for screening mammogram for malignant neoplasm of breast: Secondary | ICD-10-CM

## 2020-12-21 ENCOUNTER — Encounter: Payer: 59 | Admitting: Gastroenterology

## 2021-01-08 ENCOUNTER — Encounter: Payer: 59 | Admitting: Gastroenterology

## 2021-01-16 ENCOUNTER — Ambulatory Visit
Admission: RE | Admit: 2021-01-16 | Discharge: 2021-01-16 | Disposition: A | Discharge status: Home / Self Care | Payer: 59 | Source: Ambulatory Visit | Attending: Hematology and Oncology | Admitting: Hematology and Oncology

## 2021-01-16 DIAGNOSIS — Z1231 Encounter for screening mammogram for malignant neoplasm of breast: Secondary | ICD-10-CM

## 2021-01-22 ENCOUNTER — Other Ambulatory Visit: Payer: Self-pay

## 2021-01-22 ENCOUNTER — Ambulatory Visit (AMBULATORY_SURGERY_CENTER): Payer: 59

## 2021-01-22 VITALS — Ht 66.5 in | Wt 243.0 lb

## 2021-01-22 DIAGNOSIS — Z1211 Encounter for screening for malignant neoplasm of colon: Secondary | ICD-10-CM

## 2021-01-22 MED ORDER — NA SULFATE-K SULFATE-MG SULF 17.5-3.13-1.6 GM/177ML PO SOLN: 1.0000 | Freq: Once | ORAL | 0 refills | Status: AC

## 2021-01-22 NOTE — Progress Notes (Signed)

## 2021-02-07 ENCOUNTER — Encounter: Payer: Self-pay | Admitting: Gastroenterology

## 2021-02-07 ENCOUNTER — Encounter: Payer: Self-pay | Admitting: Hematology and Oncology

## 2021-02-08 ENCOUNTER — Ambulatory Visit (INDEPENDENT_AMBULATORY_CARE_PROVIDER_SITE_OTHER): Payer: 59 | Admitting: Nurse Practitioner

## 2021-02-08 ENCOUNTER — Other Ambulatory Visit: Payer: Self-pay

## 2021-02-08 ENCOUNTER — Encounter: Payer: Self-pay | Admitting: Nurse Practitioner

## 2021-02-08 VITALS — BP 120/82 | HR 62 | Temp 97.0°F | Ht 66.5 in | Wt 240.2 lb

## 2021-02-08 DIAGNOSIS — Z13220 Encounter for screening for lipoid disorders: Secondary | ICD-10-CM

## 2021-02-08 DIAGNOSIS — E559 Vitamin D deficiency, unspecified: Secondary | ICD-10-CM

## 2021-02-08 DIAGNOSIS — Z23 Encounter for immunization: Secondary | ICD-10-CM | POA: Diagnosis not present

## 2021-02-08 DIAGNOSIS — D500 Iron deficiency anemia secondary to blood loss (chronic): Secondary | ICD-10-CM

## 2021-02-08 DIAGNOSIS — Z136 Encounter for screening for cardiovascular disorders: Secondary | ICD-10-CM | POA: Diagnosis not present

## 2021-02-08 DIAGNOSIS — Z6839 Body mass index (BMI) 39.0-39.9, adult: Secondary | ICD-10-CM | POA: Diagnosis not present

## 2021-02-08 LAB — CBC WITH DIFFERENTIAL/PLATELET
Basophils Absolute: 0.1 10*3/uL (ref 0.0–0.1)
Basophils Relative: 1.2 % (ref 0.0–3.0)
Eosinophils Absolute: 0.2 10*3/uL (ref 0.0–0.7)
Eosinophils Relative: 4.3 % (ref 0.0–5.0)
HCT: 41.8 % (ref 36.0–46.0)
Hemoglobin: 13.6 g/dL (ref 12.0–15.0)
Lymphocytes Relative: 37.7 % (ref 12.0–46.0)
Lymphs Abs: 1.9 10*3/uL (ref 0.7–4.0)
MCHC: 32.4 g/dL (ref 30.0–36.0)
MCV: 72.1 fl — ABNORMAL LOW (ref 78.0–100.0)
Monocytes Absolute: 0.4 10*3/uL (ref 0.1–1.0)
Monocytes Relative: 8.7 % (ref 3.0–12.0)
Neutro Abs: 2.4 10*3/uL (ref 1.4–7.7)
Neutrophils Relative %: 48.1 % (ref 43.0–77.0)
Platelets: 181 10*3/uL (ref 150.0–400.0)
RBC: 5.8 Mil/uL — ABNORMAL HIGH (ref 3.87–5.11)
RDW: 16.9 % — ABNORMAL HIGH (ref 11.5–15.5)
WBC: 5.1 10*3/uL (ref 4.0–10.5)

## 2021-02-08 LAB — COMPREHENSIVE METABOLIC PANEL
ALT: 59 U/L — ABNORMAL HIGH (ref 0–35)
AST: 45 U/L — ABNORMAL HIGH (ref 0–37)
Albumin: 3.8 g/dL (ref 3.5–5.2)
Alkaline Phosphatase: 53 U/L (ref 39–117)
BUN: 16 mg/dL (ref 6–23)
CO2: 27 mEq/L (ref 19–32)
Calcium: 9 mg/dL (ref 8.4–10.5)
Chloride: 104 mEq/L (ref 96–112)
Creatinine, Ser: 0.68 mg/dL (ref 0.40–1.20)
GFR: 102.31 mL/min (ref >60.00)
Glucose, Bld: 93 mg/dL (ref 70–99)
Potassium: 4 mEq/L (ref 3.5–5.1)
Sodium: 138 mEq/L (ref 135–145)
Total Bilirubin: 1 mg/dL (ref 0.2–1.2)
Total Protein: 7.1 g/dL (ref 6.0–8.3)

## 2021-02-08 LAB — LIPID PANEL
Cholesterol: 176 mg/dL (ref 0–200)
HDL: 40.2 mg/dL (ref >39.00)
LDL Cholesterol: 112 mg/dL — ABNORMAL HIGH (ref 0–99)
NonHDL: 135.62
Total CHOL/HDL Ratio: 4
Triglycerides: 120 mg/dL (ref 0.0–149.0)
VLDL: 24 mg/dL (ref 0.0–40.0)

## 2021-02-08 LAB — VITAMIN D 25 HYDROXY (VIT D DEFICIENCY, FRACTURES): VITD: 18.35 ng/mL — ABNORMAL LOW (ref 30.00–100.00)

## 2021-02-08 NOTE — Progress Notes (Signed)
Subjective:  Patient ID: Susan Morrison, female    DOB: Jan 08, 1971  Age: 51 y.o. MRN: [id number]  CC: Follow-up (Weight management Pt is fasting. )  HPI  Class 2 severe obesity due to excess calories with serious comorbidity and body mass index (BMI) of 39.0 to 39.9 in adult [facility]) Lost 6lbs in last 42months. Has made dietary changes: low carb diet, avoids sodas and juices, no fried foods, cooks in [place] mostly. Has not incorporated exercise due to work schedule during the holiday period, but plans to start this month. Wt Readings from Last 3 Encounters:  02/08/21 240 lb 3.2 oz (109 kg)  01/22/21 243 lb (110.2 kg)  11/07/20 246 lb 6.4 oz (111.8 kg)   BP Readings from Last 3 Encounters:  02/08/21 120/82  11/07/20 126/78  05/29/20 138/82  previous hgbA1c of 6.0 normall TSH LDL 103, TC/HDL ratio 4 (05/2020)  Repeat lipid panel and vit. D today Maintain haert healthy diet Start regular exercise with goal of 168mins per week. F/up in 27months   Iron deficiency anemia due to chronic blood loss No obvious GI or vaginal bleed Scheduled for colonoscopy on 02/12/21 Repeat cbc today Depression screen [facility] 2/9 02/08/2021 05/08/2020 03/26/2018  Decreased Interest 0 2 0  Down, Depressed, Hopeless 0 0 0  PHQ - 2 Score 0 2 0  Altered sleeping 0 0 -  Tired, decreased energy 1 3 -  Change in appetite 0 3 -  Feeling bad or failure about yourself  0 0 -  Trouble concentrating 0 2 -  Moving slowly or fidgety/restless 0 0 -  Suicidal thoughts 0 0 -  PHQ-9 Score 1 10 -  Difficult doing work/chores Not difficult at all Very difficult -  Some recent data might be hidden    GAD 7 : Generalized Anxiety Score 02/08/2021 05/08/2020  Nervous, Anxious, on Edge 0 0  Control/stop worrying 0 2  Worry too much - different things 0 2  Trouble relaxing 0 0  Restless 0 0  Easily annoyed or irritable 0 2  Afraid - awful might happen 0 0  Total GAD 7 Score 0 6  Anxiety Difficulty - Very difficult    Reviewed past Medical, Social and Family history today.  Outpatient Medications Prior to Visit  Medication Sig Dispense Refill   acetaminophen (TYLENOL) 500 MG tablet Take 1,000 mg by mouth every 6 (six) hours as needed (for pain.).      albuterol (VENTOLIN HFA) 108 (90 Base) MCG/ACT inhaler Inhale 1-2 puffs into the lungs every 6 (six) hours as needed for wheezing or shortness of breath. 8 g 0   Multiple Vitamins-Minerals (MULTIVITAMIN WITH MINERALS) tablet Take 1 tablet by mouth daily.     No facility-administered medications prior to visit.   ROS See HPI  Objective:  BP 120/82 (BP Location: Right Arm, Patient Position: Sitting, Cuff Size: Large)    Pulse 62    Temp (!) 97 F (36.1 C) (Temporal)    Ht 5' 6.5" (1.689 m)    Wt 240 lb 3.2 oz (109 kg)    LMP 11/11/2017 (Approximate)    SpO2 96%    BMI 38.19 kg/m   Physical Exam Constitutional:      Appearance: She is obese.  Cardiovascular:     Rate and Rhythm: Normal rate and regular rhythm.     Pulses: Normal pulses.     Heart sounds: Normal heart sounds.  Pulmonary:     Effort: Pulmonary effort is normal.  Breath sounds: Normal breath sounds.  Musculoskeletal:     Right lower leg: No edema.     Left lower leg: No edema.  Neurological:     Mental Status: She is alert and oriented to person, place, and time.  Psychiatric:        Mood and Affect: Mood normal.        Behavior: Behavior normal.        Thought Content: Thought content normal.   Assessment & Plan:  This visit occurred during the SARS-CoV-2 public health emergency.  Safety protocols were in place, including screening questions prior to the visit, additional usage of staff PPE, and extensive cleaning of exam room while observing appropriate contact time as indicated for disinfecting solutions.   Susan Morrison was seen today for follow-up.  Diagnoses and all orders for this visit:  Iron deficiency anemia due to chronic blood loss -     CBC with  Differential/Platelet  Class 2 severe obesity due to excess calories with serious comorbidity and body mass index (BMI) of 39.0 to 39.9 in adult [facility]) -     Comprehensive metabolic panel -     Lipid panel  Encounter for lipid screening for cardiovascular disease -     Lipid panel  Vitamin D insufficiency -     Vitamin D (25 hydroxy)    Problem List Items Addressed This Visit       Other   Class 2 severe obesity due to excess calories with serious comorbidity and body mass index (BMI) of 39.0 to 39.9 in adult [facility])    Lost 6lbs in last 67months. Has made dietary changes: low carb diet, avoids sodas and juices, no fried foods, cooks in [place] mostly. Has not incorporated exercise due to work schedule during the holiday period, but plans to start this month. Wt Readings from Last 3 Encounters:  02/08/21 240 lb 3.2 oz (109 kg)  01/22/21 243 lb (110.2 kg)  11/07/20 246 lb 6.4 oz (111.8 kg)   BP Readings from Last 3 Encounters:  02/08/21 120/82  11/07/20 126/78  05/29/20 138/82  previous hgbA1c of 6.0 normall TSH LDL 103, TC/HDL ratio 4 (05/2020)  Repeat lipid panel and vit. D today Maintain haert healthy diet Start regular exercise with goal of 155mins per week. F/up in 18months       Relevant Orders   Comprehensive metabolic panel   Lipid panel   Iron deficiency anemia due to chronic blood loss - Primary    No obvious GI or vaginal bleed Scheduled for colonoscopy on 02/12/21 Repeat cbc today      Relevant Orders   CBC with Differential/Platelet   Other Visit Diagnoses     Encounter for lipid screening for cardiovascular disease       Relevant Orders   Lipid panel   Vitamin D insufficiency       Relevant Orders   Vitamin D (25 hydroxy)       Follow-up: Return in about 3 months (around 05/09/2021) for CPE (fasting) and weight management.  Wilfred Lacy, NP

## 2021-02-08 NOTE — Patient Instructions (Addendum)
Go to lab for blood draw  Schedule appt with GYN for repeat PAP Maintain upcoming appt with oncology and for colonoscopy.  Maintain heart healthy diet Start regular exercise (173mins per week)  How to Increase Your Level of Physical Activity Getting regular physical activity is important for your overall health and well-being. Most people do not get enough exercise. There are easy ways to increase your level of physical activity, even if you have not been very active in the past or if you are just starting out. What are the benefits of physical activity? Physical activity has many short-term and long-term benefits. Being active on a regular basis can improve your physical and mental health as well as provide other benefits. Physical health benefits Helping you lose weight or maintain a healthy weight. Strengthening your muscles and bones. Reducing your risk of certain long-term (chronic) diseases, including heart disease, cancer, and diabetes. Being able to move around more easily and for longer periods of time without getting tired (increased endurance or stamina). Improving your ability to fight off illness (enhanced immunity). Being able to sleep better. Helping you stay healthy as you get older, including: Helping you stay mobile, or capable of walking and moving around. Preventing accidents, such as falls. Increasing life expectancy. Mental health benefits Boosting your mood and improving your self-esteem. Lowering your chance of having mental health problems, such as depression or anxiety. Helping you feel good about your body. Other benefits Finding new sources of fun and enjoyment. Meeting new people who share a common interest. Before you begin If you have a chronic illness or have not been active for a while, check with your health care provider about how to get started. Ask your health care provider what activities are safe for you. Start out slowly. Walking or doing some  simple chair exercises is a good place to start, especially if you have not been active before or for a long time. Set goals that you can work toward. Ask your health care provider how much exercise is best for you. In general, most adults should: Do moderate-intensity exercise for at least 150 minutes each week (30 minutes on most days of the week) or vigorous exercise for at least 75 minutes each week, or a combination of these. Moderate-intensity exercise can include walking at a quick pace, biking, yoga, water aerobics, or gardening. Vigorous exercise involves activities that take more effort, such as jogging or running, playing sports, swimming laps, or jumping rope. Do strength exercises on at least 2 days each week. This can include weight lifting, body weight exercises, and resistance-band exercises. How to be more physically active Make a plan  Try to find activities that you enjoy. You are more likely to commit to an exercise routine if it does not feel like a chore. If you have bone or joint problems, choose low-impact exercises, like walking or swimming. Use these tips for being successful with an exercise plan: Find a workout partner for accountability. Join a group or class, such as an aerobics class, cycling class, or sports team. Make family time active. Go for a walk, bike, or swim. Include a variety of exercises each week. Consider using a fitness tracker, such as a mobile phone app or a device worn like a watch, that will count the number of steps you take each day. Many people strive to reach 10,000 steps a day. Find ways to be active in your daily routines Besides your formal exercise plans, you can find ways  to do physical activity during your daily routines, such as: Walking or biking to work or to the store. Taking the stairs instead of the elevator. Parking farther away from the door at work or at the store. Planning walking meetings. Walking around while you are on  the phone. Where to find more information Centers for Disease Control and Prevention: WorkDashboard.es President's Council on Fitness, Sports & Nutrition: www.fitness.gov ChooseMyPlate: MassVoice.es Contact a health care provider if: You have headaches, muscle aches, or joint pain that is concerning. You feel dizzy or light-headed while exercising. You faint. You feel your heart skipping, racing, or fluttering. You have chest pain while exercising. Summary Exercise benefits your mind and body at any age, even if you are just starting out. If you have a chronic illness or have not been active for a while, check with your health care provider before increasing your physical activity. Choose activities that are safe and enjoyable for you. Ask your health care provider what activities are safe for you. Start slowly. Tell your health care provider if you have problems as you start to increase your activity level. This information is not intended to replace advice given to you by your health care provider. Make sure you discuss any questions you have with your health care provider. Document Revised: 05/19/2020 Document Reviewed: 05/19/2020 Elsevier Patient Education  [place].

## 2021-02-08 NOTE — Assessment & Plan Note (Signed)
Lost 6lbs in last 43months. Has made dietary changes: low carb diet, avoids sodas and juices, no fried foods, cooks in [place] mostly. Has not incorporated exercise due to work schedule during the holiday period, but plans to start this month. Wt Readings from Last 3 Encounters:  02/08/21 240 lb 3.2 oz (109 kg)  01/22/21 243 lb (110.2 kg)  11/07/20 246 lb 6.4 oz (111.8 kg)   BP Readings from Last 3 Encounters:  02/08/21 120/82  11/07/20 126/78  05/29/20 138/82  previous hgbA1c of 6.0 normall TSH LDL 103, TC/HDL ratio 4 (05/2020)  Repeat lipid panel and vit. D today Maintain haert healthy diet Start regular exercise with goal of 164mins per week. F/up in 61months

## 2021-02-08 NOTE — Assessment & Plan Note (Signed)
No obvious GI or vaginal bleed Scheduled for colonoscopy on 02/12/21 Repeat cbc today

## 2021-02-08 NOTE — Addendum Note (Signed)
Addended by: Renette Butters on: 02/08/2021 09:22 AM   Modules accepted: Orders

## 2021-02-11 MED ORDER — VITAMIN D (ERGOCALCIFEROL) 1.25 MG (50000 UNIT) PO CAPS: 50000.0000 [IU] | ORAL_CAPSULE | ORAL | 0 refills | Status: DC

## 2021-02-11 NOTE — Addendum Note (Signed)
Addended by: Wilfred Lacy L on: 02/11/2021 02:10 PM   Modules accepted: Orders

## 2021-02-12 ENCOUNTER — Ambulatory Visit (AMBULATORY_SURGERY_CENTER): Payer: 59 | Admitting: Gastroenterology

## 2021-02-12 ENCOUNTER — Encounter: Payer: Self-pay | Admitting: Gastroenterology

## 2021-02-12 ENCOUNTER — Other Ambulatory Visit: Payer: Self-pay

## 2021-02-12 VITALS — BP 139/55 | HR 58 | Temp 98.6°F | Resp 21 | Ht 66.5 in | Wt 243.0 lb

## 2021-02-12 DIAGNOSIS — Z1211 Encounter for screening for malignant neoplasm of colon: Secondary | ICD-10-CM

## 2021-02-12 MED ORDER — SODIUM CHLORIDE 0.9 % IV SOLN: 500.0000 mL | Freq: Once | INTRAVENOUS | Status: DC

## 2021-02-12 NOTE — Progress Notes (Signed)
Pt's states no medical or surgical changes since previsit or office visit. Pt does denie and colon or small bowel surg as indicated in history

## 2021-02-12 NOTE — Progress Notes (Signed)
[facility] History and Physical   Primary Care Physician:  Nche, Charlene Brooke, NP   Reason for Procedure:   Colon cancer screening  Plan:    Screening colonoscopy     HPI: Susan Morrison is a 51 y.o. female undergoing initial average risk screening colonoscopy.  She has no family history of colon cancer and no chronic GI symptoms.    Past Medical History:  Diagnosis Date   Anemia    Arthritis    Breast cancer ([place]) 2018   LEFT   Breast mass 03/21/2016   left 7:00   Genetic testing 06/03/2016   [name] underwent genetic counseling and testing for hereditary cancer syndromes on 05/15/2016. Her results were negative for mutations in all 46 genes analyzed by [facility]'s 46-gene Common Hereditary Cancers Panel. Genes analyzed include: APC, ATM, AXIN2, BARD1, BMPR1A, BRCA1, BRCA2, BRIP1, CDH1, CDKN2A, CHEK2, CTNNA1, DICER1, EPCAM, GREM1, HOXB13, KIT, MEN1, MLH1, MSH2, MSH3, MSH6, MUTYH, NBN,   History of blood transfusion 2019   due to breast cancer and needing transfusion   History of kidney stones    History of radiation therapy 12/04/16-01/14/17   left breast 50.4 Gy in 28 fractions   Personal history of chemotherapy    March-June 2018   Personal history of radiation therapy    Oct-Dec 2018   PONV (postoperative nausea and vomiting)     Past Surgical History:  Procedure Laterality Date   ABDOMINAL HYSTERECTOMY  03/05/2018   BREAST LUMPECTOMY Left 2018   CYSTOSCOPY N/A 03/05/2018   Procedure: CYSTOSCOPY;  Surgeon: Sherlyn Hay, DO;  Location: [place];  Service: Gynecology;  Laterality: N/A;   DILATION AND CURETTAGE OF UTERUS     last done 2years ago   PORT-A-CATH REMOVAL  12/2016   PORTACATH PLACEMENT N/A 04/15/2016   Procedure: INSERTION PORT-A-CATH WITH [place];  Surgeon: Alphonsa Overall, MD;  Location: [facility];  Service: General;  Laterality: N/A;   RADIOACTIVE SEED GUIDED AXILLARY SENTINEL LYMPH NODE Left 10/24/2016   Procedure:  RADIOACTIVE SEED GUIDED AXILLARY TARGETED SENTINEL LYMPH NODE DISSECTION;  Surgeon: Alphonsa Overall, MD;  Location: [place];  Service: General;  Laterality: Left;   RADIOACTIVE SEED GUIDED EXCISIONAL BREAST BIOPSY Bilateral 10/24/2016   Procedure: RIGHT BREAST RADIOACTIVE SEED GUIDED EXCISIONAL BREAST BIOPSIES X'S 2, LEFT BREAST RADIOACTIVE SEED GUIDED EXCISIONAL BIOPSY X2;  Surgeon: Alphonsa Overall, MD;  Location: [place];  Service: General;  Laterality: Bilateral;   ROBOT ASSISTED PYELOPLASTY Left 03/05/2017   Procedure: XI ROBOTIC ASSISTED PYELOPLASTY;  Surgeon: Ardis Hughs, MD;  Location: [facility];  Service: Urology;  Laterality: Left;   SMALL INTESTINE SURGERY     pt denies having had small intestine surg   TOTAL LAPAROSCOPIC HYSTERECTOMY WITH SALPINGECTOMY Bilateral 03/05/2018   Procedure: TOTAL LAPAROSCOPIC HYSTERECTOMY WITH SALPINGECTOMY, poss open;  Surgeon: Sherlyn Hay, DO;  Location: [place];  Service: Gynecology;  Laterality: Bilateral;  Poss Open Hysterectomy   TUBAL LIGATION  1996    Prior to Admission medications   Medication Sig Start Date End Date Taking? Authorizing Provider  acetaminophen (TYLENOL) 500 MG tablet Take 1,000 mg by mouth every 6 (six) hours as needed (for pain.).    Yes [provider]  Multiple Vitamins-Minerals (MULTIVITAMIN WITH MINERALS) tablet Take 1 tablet by mouth daily.   Yes [provider]  Vitamin D, Ergocalciferol, (DRISDOL) 1.25 MG (50000 UNIT) CAPS capsule Take 1 capsule (50,000 Units total) by mouth every 7 (seven)  days. 02/11/21  Yes Nche, Charlene Brooke, NP  albuterol (VENTOLIN HFA) 108 (90 Base) MCG/ACT inhaler Inhale 1-2 puffs into the lungs every 6 (six) hours as needed for wheezing or shortness of breath. 02/14/20   Nche, Charlene Brooke, NP    Current Outpatient Medications  Medication Sig Dispense Refill   acetaminophen (TYLENOL) 500 MG tablet Take 1,000 mg by  mouth every 6 (six) hours as needed (for pain.).      Multiple Vitamins-Minerals (MULTIVITAMIN WITH MINERALS) tablet Take 1 tablet by mouth daily.     Vitamin D, Ergocalciferol, (DRISDOL) 1.25 MG (50000 UNIT) CAPS capsule Take 1 capsule (50,000 Units total) by mouth every 7 (seven) days. 12 capsule 0   albuterol (VENTOLIN HFA) 108 (90 Base) MCG/ACT inhaler Inhale 1-2 puffs into the lungs every 6 (six) hours as needed for wheezing or shortness of breath. 8 g 0   Current Facility-Administered Medications  Medication Dose Route Frequency Provider Last Rate Last Admin   0.9 %  sodium chloride infusion  500 mL Intravenous Once Daryel November, MD        Allergies as of 02/12/2021 - Review Complete 02/12/2021  Allergen Reaction Noted   Ibuprofen Other (See Comments) 02/24/2018    Family History  Problem Relation Age of Onset   Hypertension Mother    Diabetes Mother    Breast cancer Paternal Grandmother 83       d.62 bilateral breast cancer   Other Father        Homicide    Social History   Socioeconomic History   Marital status: Married    Spouse name: Not on file   Number of children: 3   Years of education: 14   Highest education level: Not on file  Occupational History   Occupation: Best boy: [employer]  Tobacco Use   Smoking status: Never   Smokeless tobacco: Never  Vaping Use   Vaping Use: Never used  Substance and Sexual Activity   Alcohol use: Not Currently    Comment: wine/social   Drug use: No   Sexual activity: Yes    Birth control/protection: Surgical    Comment: tubal ligation and hysterectomy  Other Topics Concern   Not on file  Social History Narrative   Lives with fiancee in a one story home.     Works as a Freight forwarder for [employer].     Education: 2 years of college.    Social Determinants of Health   Financial Resource Strain: Not on file  Food Insecurity: Not on file  Transportation Needs: Not on file  Physical Activity: Not on  file  Stress: Not on file  Social Connections: Not on file  Intimate Partner Violence: Not on file    Review of Systems:  All other review of systems negative except as mentioned in the HPI.  Physical Exam: Vital signs BP (!) 146/94    Pulse 65    Temp 98.6 F (37 C)    Ht 5' 6.5" (1.689 m)    Wt 243 lb (110.2 kg)    LMP 11/11/2017 (Approximate)    SpO2 98%    BMI 38.63 kg/m   General:   Alert,  Well-developed, well-nourished, pleasant and cooperative in NAD Airway:  Mallampati 2 Lungs:  Clear throughout to auscultation.   Heart:  Regular rate and rhythm; no murmurs, clicks, rubs,  or gallops. Abdomen:  Soft, nontender and nondistended. Normal bowel sounds.   Neuro/Psych:  Normal mood and affect.  A and O x 3   Demetri Goshert E. Candis Schatz, MD [facility]

## 2021-02-12 NOTE — Patient Instructions (Signed)
Resume previous diet and medications. Repeat Colonoscopy in 10 years for surveillance.  YOU HAD AN ENDOSCOPIC PROCEDURE TODAY AT [facility]:   Refer to the procedure report that was given to you for any specific questions about what was found during the examination.  If the procedure report does not answer your questions, please call your gastroenterologist to clarify.  If you requested that your care partner not be given the details of your procedure findings, then the procedure report has been included in a sealed envelope for you to review at your convenience later.  YOU SHOULD EXPECT: Some feelings of bloating in the abdomen. Passage of more gas than usual.  Walking can help get rid of the air that was put into your GI tract during the procedure and reduce the bloating. If you had a lower endoscopy (such as a colonoscopy or flexible sigmoidoscopy) you may notice spotting of blood in your stool or on the toilet paper. If you underwent a bowel prep for your procedure, you may not have a normal bowel movement for a few days.  Please Note:  You might notice some irritation and congestion in your nose or some drainage.  This is from the oxygen used during your procedure.  There is no need for concern and it should clear up in a day or so.  SYMPTOMS TO REPORT IMMEDIATELY:  Following lower endoscopy (colonoscopy or flexible sigmoidoscopy):  Excessive amounts of blood in the stool  Significant tenderness or worsening of abdominal pains  Swelling of the abdomen that is new, acute  Fever of 100F or higher  For urgent or emergent issues, a gastroenterologist can be reached at any hour by calling [phone]. Do not use MyChart messaging for urgent concerns.    DIET:  We do recommend a small meal at first, but then you may proceed to your regular diet.  Drink plenty of fluids but you should avoid alcoholic beverages for 24 hours.  ACTIVITY:  You should plan to take it easy for the  rest of today and you should NOT DRIVE or use heavy machinery until tomorrow (because of the sedation medicines used during the test).    FOLLOW UP: Our staff will call the number listed on your records 48-72 hours following your procedure to check on you and address any questions or concerns that you may have regarding the information given to you following your procedure. If we do not reach you, we will leave a message.  We will attempt to reach you two times.  During this call, we will ask if you have developed any symptoms of COVID 19. If you develop any symptoms (ie: fever, flu-like symptoms, shortness of breath, cough etc.) before then, please call [phone].  If you test positive for Covid 19 in the 2 weeks post procedure, please call and report this information to [place].    If any biopsies were taken you will be contacted by phone or by letter within the next 1-3 weeks.  Please call us at [phone] if you have not heard about the biopsies in 3 weeks.    SIGNATURES/CONFIDENTIALITY: You and/or your care partner have signed paperwork which will be entered into your electronic medical record.  These signatures attest to the fact that that the information above on your After Visit Summary has been reviewed and is understood.  Full responsibility of the confidentiality of this discharge information lies with you and/or your care-partner.

## 2021-02-12 NOTE — Progress Notes (Signed)
Sedate, gd SR, tolerated procedure well, VSS, report to RN 

## 2021-02-12 NOTE — Op Note (Signed)
[place] Patient Name: Susan Morrison Procedure Date: 02/12/2021 7:59 AM MRN: [id number] Endoscopist: Nicki Reaper E. Candis Schatz , MD Age: 51 Referring MD:  Date of Birth: [birth year] Gender: Female Account #: [id number] Procedure:                Colonoscopy Indications:              Screening for colorectal malignant neoplasm, This                            is the patient's first colonoscopy Medicines:                Monitored Anesthesia Care Procedure:                Pre-Anesthesia Assessment:                           - Prior to the procedure, a History and Physical                            was performed, and patient medications and                            allergies were reviewed. The patient's tolerance of                            previous anesthesia was also reviewed. The risks                            and benefits of the procedure and the sedation                            options and risks were discussed with the patient.                            All questions were answered, and informed consent                            was obtained. Prior Anticoagulants: The patient has                            taken no previous anticoagulant or antiplatelet                            agents. ASA Grade Assessment: II - A patient with                            mild systemic disease. After reviewing the risks                            and benefits, the patient was deemed in                            satisfactory condition to undergo the procedure.  After obtaining informed consent, the colonoscope                            was passed under direct vision. Throughout the                            procedure, the patient's blood pressure, pulse, and                            oxygen saturations were monitored continuously. The                            Colonoscope was introduced through the anus and                            advanced to the the  cecum, identified by                            appendiceal orifice and ileocecal valve. The                            colonoscopy was performed without difficulty. The                            patient tolerated the procedure well. The quality                            of the bowel preparation was good. The ileocecal                            valve, appendiceal orifice, and rectum were                            photographed. The bowel preparation used was SUPREP                            via split dose instruction. Scope In: 8:06:40 AM Scope Out: 8:18:15 AM Scope Withdrawal Time: 0 hours 7 minutes 34 seconds  Total Procedure Duration: 0 hours 11 minutes 35 seconds  Findings:                 The perianal and digital rectal examinations were                            normal. Pertinent negatives include normal                            sphincter tone and no palpable rectal lesions.                           A few small-mouthed diverticula were found in the                            sigmoid colon and descending colon.  The exam was otherwise normal throughout the                            examined colon.                           The retroflexed view of the distal rectum and anal                            verge was normal and showed no anal or rectal                            abnormalities. Complications:            No immediate complications. Estimated Blood Loss:     Estimated blood loss: none. Impression:               - Diverticulosis in the sigmoid colon and in the                            descending colon.                           - The distal rectum and anal verge are normal on                            retroflexion view.                           - No specimens collected. Recommendation:           - Patient has a contact number available for                            emergencies. The signs and symptoms of potential                             delayed complications were discussed with the                            patient. Return to normal activities tomorrow.                            Written discharge instructions were provided to the                            patient.                           - Resume previous diet.                           - Continue present medications.                           - Repeat colonoscopy in 10 years for screening  purposes. Susan Morrison E. Candis Schatz, MD 02/12/2021 8:23:40 AM This report has been signed electronically.

## 2021-02-14 ENCOUNTER — Telehealth: Payer: Self-pay | Admitting: *Deleted

## 2021-02-14 NOTE — Telephone Encounter (Signed)
°  Follow up Call-  Call back number 02/12/2021  Post procedure Call Back phone  # [phone]  Permission to leave phone message Yes  Some recent data might be hidden     Patient questions:  Do you have a fever, pain , or abdominal swelling? No. Pain Score  0 *  Have you tolerated food without any problems? Yes.    Have you been able to return to your normal activities? Yes.    Do you have any questions about your discharge instructions: Diet   No. Medications  No. Follow up visit  No.  Do you have questions or concerns about your Care? No.  Actions: * If pain score is 4 or above: No action needed, pain <4.

## 2021-03-16 ENCOUNTER — Telehealth: Payer: Self-pay | Admitting: Adult Health

## 2021-03-16 NOTE — Telephone Encounter (Signed)
case #[id number].  I called to complete peer to peer for patient undergoing MRI of the breast.  I called [phone].  I selected option #3 as instructed in the peer to peer request I received.   I attempted to call this number multiple times however the call continued to get disconnected as I waited on hold for physician to answer. Unable to get anyone to answer the phone.  Wilber Bihari, NP 03/16/21 4:54 PM Medical Oncology and Hematology [facility] [place], [place] [address] Tel. [phone]    Fax. [phone]

## 2021-03-22 ENCOUNTER — Other Ambulatory Visit: Payer: 59

## 2021-03-25 LAB — HM MAMMOGRAPHY: HM Mammogram: NORMAL (ref 0–4)

## 2021-04-02 ENCOUNTER — Other Ambulatory Visit: Payer: 59

## 2021-04-17 ENCOUNTER — Other Ambulatory Visit: Payer: Self-pay

## 2021-04-17 ENCOUNTER — Encounter: Payer: Self-pay | Admitting: Nurse Practitioner

## 2021-04-17 ENCOUNTER — Ambulatory Visit (INDEPENDENT_AMBULATORY_CARE_PROVIDER_SITE_OTHER): Payer: 59 | Admitting: Nurse Practitioner

## 2021-04-17 ENCOUNTER — Other Ambulatory Visit: Payer: 59

## 2021-04-17 VITALS — BP 130/82 | HR 74 | Temp 97.0°F | Ht 66.5 in | Wt 243.0 lb

## 2021-04-17 DIAGNOSIS — G4481 Hypnic headache: Secondary | ICD-10-CM

## 2021-04-17 DIAGNOSIS — R0683 Snoring: Secondary | ICD-10-CM

## 2021-04-17 DIAGNOSIS — R030 Elevated blood-pressure reading, without diagnosis of hypertension: Secondary | ICD-10-CM | POA: Diagnosis not present

## 2021-04-17 NOTE — Progress Notes (Signed)
? ?Subjective:  ?Patient ID: Susan Morrison, female    DOB: 1970-03-16  Age: 51 y.o. MRN: [id number] ? ?CC: Acute Visit (Pt c/o HAs and dizziness x 1 week. Pt states during her last OV you all discussed it likely being vertigo but states it feels worse this time than last time. ) ? ?Headache  ?This is a new problem. The current episode started 1 to 4 weeks ago. The problem occurs constantly. The problem has been unchanged. The pain is located in the Frontal region. The pain does not radiate. The pain quality is similar to prior headaches. The quality of the pain is described as aching and dull. Associated symptoms include dizziness. Pertinent negatives include no abdominal pain, abnormal behavior, anorexia, back pain, blurred vision, coughing, drainage, ear pain, eye pain, eye redness, eye watering, facial sweating, fever, hearing loss, insomnia, loss of balance, muscle aches, nausea, neck pain, numbness, phonophobia, photophobia, rhinorrhea, scalp tenderness, seizures, sinus pressure, sore throat, swollen glands, tingling, tinnitus, visual change, vomiting, weakness or weight loss. The symptoms are aggravated by caffeine withdrawal. She has tried acetaminophen for the symptoms. The treatment provided significant relief. Her past medical history is significant for obesity. There is no history of cancer, cluster headaches, immunosuppression, migraine headaches, migraines in the family, pseudotumor cerebri, recent head traumas, sinus disease or TMJ.  ?She also admits to loud snoring  at bedtime. No daytime somnolence, no LE edema. ?Headache is worse in Am, associated with dizziness. Constant in last 1week. onset x 81month. Use of Tylenol '500mg'$  with significant improvement. ?Previous caffeine consumption: 2-3cans of mountain dew or Dr. PMalachi Morrison Stopped all caffeine consumption 230monthago. ?  ?BP Readings from Last 3 Encounters:  ?04/17/21 130/82  ?02/12/21 (!) 139/55  ?02/08/21 120/82  ?  ?Reviewed past Medical,  Social and Family history today. ? ?Outpatient Medications Prior to Visit  ?Medication Sig Dispense Refill  ? acetaminophen (TYLENOL) 500 MG tablet Take 1,000 mg by mouth every 6 (six) hours as needed (for pain.).     ? albuterol (VENTOLIN HFA) 108 (90 Base) MCG/ACT inhaler Inhale 1-2 puffs into the lungs every 6 (six) hours as needed for wheezing or shortness of breath. 8 g 0  ? Multiple Vitamins-Minerals (MULTIVITAMIN WITH MINERALS) tablet Take 1 tablet by mouth daily.    ? Vitamin D, Ergocalciferol, (DRISDOL) 1.25 MG (50000 UNIT) CAPS capsule Take 1 capsule (50,000 Units total) by mouth every 7 (seven) days. 12 capsule 0  ? ?No facility-administered medications prior to visit.  ? ? ?ROS ?See HPI ? ?Objective:  ?BP 130/82 (BP Location: Right Arm, Patient Position: Sitting)   Pulse 74   Temp (!) 97 ?F (36.1 ?C) (Temporal)   Ht 5' 6.5" (1.689 m)   Wt 243 lb (110.2 kg)   LMP 11/11/2017 (Approximate)   SpO2 98%   BMI 38.63 kg/m?  ? ?Physical Exam ?Cardiovascular:  ?   Rate and Rhythm: Normal rate and regular rhythm.  ?   Pulses: Normal pulses.  ?   Heart sounds: Normal heart sounds.  ?Pulmonary:  ?   Effort: Pulmonary effort is normal.  ?   Breath sounds: Normal breath sounds.  ?Musculoskeletal:  ?   Right lower leg: No edema.  ?   Left lower leg: No edema.  ?Neurological:  ?   Mental Status: She is alert and oriented to person, place, and time.  ?   Cranial Nerves: Cranial nerves 2-12 are intact.  ?   Coordination: Coordination is intact.  ?  Gait: Gait is intact.  ?Psychiatric:     ?   Mood and Affect: Mood normal.     ?   Behavior: Behavior normal.     ?   Thought Content: Thought content normal.  ? ? ?Assessment & Plan:  ?This visit occurred during the SARS-CoV-2 public health emergency.  Safety protocols were in place, including screening questions prior to the visit, additional usage of staff PPE, and extensive cleaning of exam room while observing appropriate contact time as indicated for disinfecting  solutions.  ? ?Susan Morrison was seen today for acute visit. ? ?Diagnoses and all orders for this visit: ? ?Elevated BP without diagnosis of hypertension ?-     Ambulatory referral to Sleep Studies ? ?Hypnic headache ?-     Ambulatory referral to Sleep Studies ? ?Loud snoring ?-     Ambulatory referral to Sleep Studies ? ?Headache may be due to sleep apnea vs caffeine withdrawal ?I entered referral to sleep clinic. You will be contacted to schedule appt. ?Continue heart healthy diet, small meal portions and daily exercise. maintain adequate oral hydration. ?Continue use of tylenol '500mg'$  for headache. ? ?Problem List Items Addressed This Visit   ?None ?Visit Diagnoses   ? ? Elevated BP without diagnosis of hypertension    -  Primary  ? Relevant Orders  ? Ambulatory referral to Sleep Studies  ? Hypnic headache      ? Relevant Orders  ? Ambulatory referral to Sleep Studies  ? Loud snoring      ? Relevant Orders  ? Ambulatory referral to Sleep Studies  ? ?  ?  ?Follow-up: Return if symptoms worsen or fail to improve. ? ?Wilfred Lacy, NP ?

## 2021-04-17 NOTE — Patient Instructions (Signed)
Headache may be due to sleep apnea vs caffeine withdrawal ?I entered referral to sleep clinic. You will be contacted to schedule appt. ?Continue heart healthy diet, small meal portions and daily exercise. ?Continue use of tylenol '500mg'$  for headache. ? ?

## 2021-04-19 ENCOUNTER — Other Ambulatory Visit: Payer: 59

## 2021-05-09 ENCOUNTER — Ambulatory Visit: Payer: 59 | Admitting: Nurse Practitioner

## 2021-05-15 NOTE — Progress Notes (Incomplete)
? ?Patient Care Team: ?Nche, Charlene Brooke, NP as PCP - General (Internal Medicine) ?Alphonsa Overall, MD as Consulting Physician (General Surgery) ?Nicholas Lose, MD as Consulting Physician (Hematology and Oncology) ?Eppie Gibson, MD as Attending Physician (Radiation Oncology) ?Gardenia Phlegm, NP as Nurse Practitioner (Hematology and Oncology) ? ?DIAGNOSIS: No diagnosis found. ? ?SUMMARY OF ONCOLOGIC HISTORY: ?Oncology History  ?Malignant neoplasm of lower-inner quadrant of left breast in female, estrogen receptor negative ([place])  ?03/22/2016 Initial Diagnosis  ? Left breast biopsy 8:00: IDC high-grade with necrosis, left axillary lymph node biopsy IDC, ER 0%, PR 0%, HER-2 negative ratio 1.42, Ki-67 90%; palpable lump 3.5 x 2.6 x 2.2 cm and 2.8 Center left axillary lymph node, T2 N1 stage IIB ?  ?04/10/2016 Initial Biopsy  ? Additional biopsies left breast 9:30: Sclerosed intraductal papilloma with every 3 hours, right breast 1:00: Complex sclerosing lesion with UDH, right breast 8:00: Complex sclerosing lesion with UDH ?  ?04/17/2016 - 08/28/2016 Neo-Adjuvant Chemotherapy  ? Dose dense Adriamycin and Cytoxan ?4 followed by Taxol weekly ?12 ?  ?05/15/2016 Genetic Testing  ? Genetic counseling and testing for hereditary cancer syndromes performed on 05/15/2016. Results are negative for pathogenic mutations in 46 genes analyzed by [facility]'s Common Hereditary Cancers Panel. Results are dated 05/24/2016. Genes tested: APC, ATM, AXIN2, BARD1, BMPR1A, BRCA1, BRCA2, BRIP1, CDH1, CDKN2A, CHEK2, CTNNA1, DICER1, EPCAM, GREM1, HOXB13, KIT, MEN1, MLH1, MSH2, MSH3, MSH6, MUTYH, NBN, NF1, NTHL1, PALB2, PDGFRA, PMS2, POLD1, POLE, PTEN, RAD50, RAD51C, RAD51D, SDHA, SDHB, SDHC, SDHD, SMAD4, SMARCA4, STK11, TP53, TSC1, TSC2, and VHL. ? ?A variant of uncertain significance (not clinically actionable) was noted in MUTYH.  ? ? ?  ?10/24/2016 Surgery  ? Right lumpectomy: No malignancy identified. Complete pathologic response. 0/4 lymph  nodes negative, previously triple negative disease with a Ki-67 of 90% ?  ?12/04/2016 - 01/14/2017 Radiation Therapy  ? Adjuvant radiation therapy ?  ? ? ?CHIEF COMPLIANT: Follow-up of right breast cancer ?  ? ?INTERVAL HISTORY: Susan Morrison is a  51 y.o. 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 She presents to the clinic today for a follow-up ? ? ?ALLERGIES:  is allergic to ibuprofen. ? ?MEDICATIONS:  ?Current Outpatient Medications  ?Medication Sig Dispense Refill  ? acetaminophen (TYLENOL) 500 MG tablet Take 1,000 mg by mouth every 6 (six) hours as needed (for pain.).     ? albuterol (VENTOLIN HFA) 108 (90 Base) MCG/ACT inhaler Inhale 1-2 puffs into the lungs every 6 (six) hours as needed for wheezing or shortness of breath. 8 g 0  ? Multiple Vitamins-Minerals (MULTIVITAMIN WITH MINERALS) tablet Take 1 tablet by mouth daily.    ? Vitamin D, Ergocalciferol, (DRISDOL) 1.25 MG (50000 UNIT) CAPS capsule Take 1 capsule (50,000 Units total) by mouth every 7 (seven) days. 12 capsule 0  ? ?No current facility-administered medications for this visit.  ? ? ?PHYSICAL EXAMINATION: ?ECOG PERFORMANCE STATUS: {CHL ONC ECOG BT:[id number]} ? ?There were no vitals filed for this visit. ?There were no vitals filed for this visit. ? ?BREAST:*** No palpable masses or nodules in either right or left breasts. No palpable axillary supraclavicular or infraclavicular adenopathy no breast tenderness or nipple discharge. (exam performed in the presence of a chaperone) ? ?LABORATORY DATA:  ?I have reviewed the data as listed ? ?  Latest Ref Rng & Units 02/08/2021  ?  9:09 AM 05/08/2020  ? 11:53 AM 03/26/2018  ?  9:59  AM  ?CMP  ?Glucose 70 - 99 mg/dL 93   89   89    ?BUN 6 - 23 mg/dL 16   11   14     ?Creatinine 0.40 - 1.20 mg/dL 0.68   0.62   0.71    ?Sodium 135 - 145 mEq/L 138   139   138    ?Potassium 3.5 - 5.1 mEq/L  4.0   4.3   4.3    ?Chloride 96 - 112 mEq/L 104   104   104    ?CO2 19 - 32 mEq/L 27   28   27     ?Calcium 8.4 - 10.5 mg/dL 9.0   9.4   9.3    ?Total Protein 6.0 - 8.3 g/dL 7.1   7.2   7.1    ?Total Bilirubin 0.2 - 1.2 mg/dL 1.0   0.9   0.6    ?Alkaline Phos 39 - 117 U/L 53   53   71    ?AST 0 - 37 U/L 45   53   31    ?ALT 0 - 35 U/L 59   79   41    ? ? ?Lab Results  ?Component Value Date  ? WBC 5.1 02/08/2021  ? HGB 13.6 02/08/2021  ? HCT 41.8 02/08/2021  ? MCV 72.1 (L) 02/08/2021  ? PLT 181.0 02/08/2021  ? NEUTROABS 2.4 02/08/2021  ? ? ?ASSESSMENT & PLAN:  ?No problem-specific Assessment & Plan notes found for this encounter. ? ? ? ?No orders of the defined types were placed in this encounter. ? ?The patient has a good understanding of the overall plan. she agrees with it. she will call with any problems that may develop before the next visit here. ?Total time spent: 30 mins including face to face time and time spent for planning, charting and co-ordination of care ? ? Suzzette Righter, CMA ?05/15/21 ? ? ? I Gardiner Coins am scribing for Dr. Lindi Adie ? ?***  ?

## 2021-05-22 ENCOUNTER — Encounter: Payer: Self-pay | Admitting: Hematology and Oncology

## 2021-05-29 ENCOUNTER — Inpatient Hospital Stay: Payer: 59 | Admitting: Hematology and Oncology

## 2021-05-29 ENCOUNTER — Telehealth: Payer: Self-pay | Admitting: Hematology and Oncology

## 2021-05-29 NOTE — Telephone Encounter (Signed)
.  Called pt per 4/25 inbasket,req to r/s , Patient was unavailable, a message with appt time and date was left with number on file.   ?

## 2021-05-29 NOTE — Assessment & Plan Note (Deleted)
1.03/22/2016: Left breast biopsy 8:00: IDC high-grade with necrosis, left axillary lymph node biopsy IDC, ER 0%, PR 0%, HER-2 negative ratio 1.42, Ki-67 90%; palpable lump 3.5 x 2.6 x 2.2 cm and 2.8 Center left axillary lymph node, T2 N1 stage IIB 2. Neoadjuvant chemotherapy with Adriamycin and Cytoxan dose dense 4 followed by Taxolweekly 12completed 08/30/2016 2.09/20/2018Right lumpectomy: No malignancy identified. Complete pathologic response. 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 Adjuvant radiation therapy 12/04/2016-01/14/2017 ------------------------------------------------------------------------------------------------------------------------------------------------------- Chemotherapy-induced neuropathy:much improved, no longer taking gabapentin or Cymbalta. Ankle pain:I discussed with her the importance of losing weight and exercising.  Breast cancer surveillance: 1.Breast exam 05/29/21: Benign 2.Breast MRI 01/11/2020: Benign, annual breast MRIs are recommended due to greater than 20% lifetime risk, her insurance denied her MRI 3.Mammogram  01/17/2021: Benign breast density category C  Return to clinic in 1 year for follow-up  

## 2021-06-04 ENCOUNTER — Encounter: Payer: Self-pay | Admitting: Hematology and Oncology

## 2021-06-11 ENCOUNTER — Encounter: Payer: Self-pay | Admitting: Neurology

## 2021-06-11 ENCOUNTER — Encounter: Payer: Self-pay | Admitting: Hematology and Oncology

## 2021-06-11 ENCOUNTER — Ambulatory Visit (INDEPENDENT_AMBULATORY_CARE_PROVIDER_SITE_OTHER): Payer: 59 | Admitting: Neurology

## 2021-06-11 VITALS — BP 134/82 | HR 62 | Ht 66.0 in | Wt 249.0 lb

## 2021-06-11 DIAGNOSIS — R0683 Snoring: Secondary | ICD-10-CM | POA: Diagnosis not present

## 2021-06-11 DIAGNOSIS — R0681 Apnea, not elsewhere classified: Secondary | ICD-10-CM

## 2021-06-11 DIAGNOSIS — Z820 Family history of epilepsy and other diseases of the nervous system: Secondary | ICD-10-CM

## 2021-06-11 DIAGNOSIS — R519 Headache, unspecified: Secondary | ICD-10-CM | POA: Diagnosis not present

## 2021-06-11 NOTE — Patient Instructions (Signed)

## 2021-06-11 NOTE — Progress Notes (Signed)
Subjective:  ?  ?Patient ID: Susan Morrison is a 51 y.o. female. ? ?HPI ? ? ? ?Susan Athar, MD, PhD ?[facility] ?[address], [address] ?[address] [address] ?[place], [place] [address] ? ?Dear Susan Morrison, ? ?I saw your patient, Susan Morrison, upon your kind request in my sleep clinic today for initial consultation of her sleep disorder, in particular, concern for underlying obstructive sleep apnea.  The patient is unaccompanied today.  As you know, Ms. Burpee is a 51-year-old right-handed woman with an underlying medical history of breast cancer, status postlumpectomy, status post chemotherapy and radiation therapy, history of kidney stones, anemia, arthritis, and severe obesity with a BMI of over 40, who reports snoring and excessive daytime somnolence as well as recurrent headaches including morning headaches.  I reviewed your office note from 04/17/2021.  She is felt to have headaches from possible underlying sleep apnea but also recent caffeine cessation from caffeine withdrawal.  She reports that she also had work-related stress which contributed to her headaches, her headaches are overall a little better, she has a morning headache about once or twice per week, does not typically take any medication for this.  Her Epworth sleepiness score is 6 out of 24, fatigue severity score is 15 out of 63.  Her mom has sleep apnea but patient is not sure if she uses a machine currently.  The patient has had witnessed apneas per husband's report and snoring is disturbing to him at times.  She goes to bed around 9 or 10 PM and rise time is around 5 AM.  She works as a binder operator.  She currently does not drink caffeine daily, maybe coffee on the weekends.  She drinks alcohol in the form of wine, up to twice a week, typically 2 glasses.  She is a non-smoker and lives with her husband.  She has 3 grown children, no pets in the household, mother-in-law lives with them as well. ? ?Her Past Medical History Is  Significant For: ?Past Medical History:  ?Diagnosis Date  ? Anemia   ? Arthritis   ? Breast cancer (HCC) 2018  ? LEFT  ? Breast mass 03/21/2016  ? left 7:00  ? Genetic testing 06/03/2016  ? Ms. Taves underwent genetic counseling and testing for hereditary cancer syndromes on 05/15/2016. Her results were negative for mutations in all 46 genes analyzed by [facility]'s 46-gene Common Hereditary Cancers Panel. Genes analyzed include: APC, ATM, AXIN2, BARD1, BMPR1A, BRCA1, BRCA2, BRIP1, CDH1, CDKN2A, CHEK2, CTNNA1, DICER1, EPCAM, GREM1, HOXB13, KIT, MEN1, MLH1, MSH2, MSH3, MSH6, MUTYH, NBN,  ? History of blood transfusion 2019  ? due to breast cancer and needing transfusion  ? History of kidney stones   ? History of radiation therapy 12/04/16-01/14/17  ? left breast 50.4 Gy in 28 fractions  ? Personal history of chemotherapy   ? March-June 2018  ? Personal history of radiation therapy   ? Oct-Dec 2018  ? PONV (postoperative nausea and vomiting)   ? ? ?Her Past Surgical History Is Significant For: ?Past Surgical History:  ?Procedure Laterality Date  ? ABDOMINAL HYSTERECTOMY  03/05/2018  ? BREAST LUMPECTOMY Left 2018  ? CYSTOSCOPY N/A 03/05/2018  ? Procedure: CYSTOSCOPY;  Surgeon: Banga, Cecilia Worema, DO;  Location: [facility];  Service: Gynecology;  Laterality: N/A;  ? DILATION AND CURETTAGE OF UTERUS    ? last done 2years ago  ? PORT-A-CATH REMOVAL  12/2016  ? PORTACATH PLACEMENT N/A 04/15/2016  ? Procedure: INSERTION PORT-A-CATH   WITH US;  Surgeon: Newman, David, MD;  Location: [facility];  Service: General;  Laterality: N/A;  ? RADIOACTIVE SEED GUIDED AXILLARY SENTINEL LYMPH NODE Left 10/24/2016  ? Procedure: RADIOACTIVE SEED GUIDED AXILLARY TARGETED SENTINEL LYMPH NODE DISSECTION;  Surgeon: Newman, David, MD;  Location: [facility];  Service: General;  Laterality: Left;  ? RADIOACTIVE SEED GUIDED EXCISIONAL BREAST BIOPSY Bilateral 10/24/2016  ? Procedure: RIGHT BREAST RADIOACTIVE SEED GUIDED  EXCISIONAL BREAST BIOPSIES X'S 2, LEFT BREAST RADIOACTIVE SEED GUIDED EXCISIONAL BIOPSY X2;  Surgeon: Newman, David, MD;  Location: [facility];  Service: General;  Laterality: Bilateral;  ? ROBOT ASSISTED PYELOPLASTY Left 03/05/2017  ? Procedure: XI ROBOTIC ASSISTED PYELOPLASTY;  Surgeon: Herrick, Benjamin W, MD;  Location: [facility];  Service: Urology;  Laterality: Left;  ? SMALL INTESTINE SURGERY    ? pt denies having had small intestine surg  ? TOTAL LAPAROSCOPIC HYSTERECTOMY WITH SALPINGECTOMY Bilateral 03/05/2018  ? Procedure: TOTAL LAPAROSCOPIC HYSTERECTOMY WITH SALPINGECTOMY, poss open;  Surgeon: Banga, Cecilia Worema, DO;  Location: [facility];  Service: Gynecology;  Laterality: Bilateral;  Poss Open Hysterectomy  ? TUBAL LIGATION  1996  ? ? ?Her Family History Is Significant For: ?Family History  ?Problem Relation Age of Onset  ? Hypertension Mother   ? Diabetes Mother   ? Sleep apnea Mother   ? Other Father   ?     Homicide  ? Breast cancer Paternal Grandmother 45  ?     d.62 bilateral breast cancer  ? ? ?Her Social History Is Significant For: ?Social History  ? ?Socioeconomic History  ? Marital status: Married  ?  Spouse name: Not on file  ? Number of children: 3  ? Years of education: 14  ? Highest education level: Not on file  ?Occupational History  ? Occupation: manager  ?  Employer: [employer]  ?Tobacco Use  ? Smoking status: Never  ? Smokeless tobacco: Never  ?Vaping Use  ? Vaping Use: Never used  ?Substance and Sexual Activity  ? Alcohol use: Not Currently  ?  Comment: wine/social  ? Drug use: No  ? Sexual activity: Yes  ?  Birth control/protection: Surgical  ?  Comment: tubal ligation and hysterectomy  ?Other Topics Concern  ? Not on file  ?Social History Narrative  ? Lives with fiancee in a one story home.    ? 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    ? Education: 2 years of college.   ? ?Social Determinants of Health  ? ?Financial Resource Strain: Not on file  ?Food  Insecurity: Not on file  ?Transportation Needs: Not on file  ?Physical Activity: Not on file  ?Stress: Not on file  ?Social Connections: Not on file  ? ? ?Her Allergies Are:  ?Allergies  ?Allergen Reactions  ? Ibuprofen Other (See Comments)  ?  Kidney issues   ?:  ? ?Her Current Medications Are:  ?Outpatient Encounter Medications as of 06/11/2021  ?Medication Sig  ? acetaminophen (TYLENOL) 500 MG tablet Take 1,000 mg by mouth every 6 (six) hours as needed (for pain.).   ? albuterol (VENTOLIN HFA) 108 (90 Base) MCG/ACT inhaler Inhale 1-2 puffs into the lungs every 6 (six) hours as needed for wheezing or shortness of breath.  ? Multiple Vitamins-Minerals (MULTIVITAMIN WITH MINERALS) tablet Take 1 tablet by mouth daily.  ? Vitamin D, Ergocalciferol, (DRISDOL) 1.25 MG (50000 UNIT) CAPS capsule Take 1 capsule (50,000 Units total) by mouth every 7 (seven) days.  ? ?  No facility-administered encounter medications on file as of 06/11/2021.  ?: ? ? ?Review of Systems:  ?Out of a complete 14 point review of systems, all are reviewed and negative with the exception of these symptoms as listed below: ? ?Review of Systems  ?Neurological:   ?     Pt is here for sleep consult Pt states she snores, and headaches. Pt denies fatigue ,hypertension,sleep study ,and CPAP machine  ? ?ESS ?FSS   ? ?Objective:  ?Neurological Exam ? ?Physical Exam ?Physical Examination:  ? ?Vitals:  ? 06/11/21 0916  ?BP: 134/82  ?Pulse: 62  ? ? ?General Examination: The patient is a very pleasant 51 y.o. female in no acute distress. She appears well-developed and well-nourished and well groomed.  ? ?HEENT: Normocephalic, atraumatic, pupils are equal, round and reactive to light, extraocular tracking is good without limitation to gaze excursion or nystagmus noted. Hearing is grossly intact. Face is symmetric with normal facial animation. Speech is clear with no dysarthria noted. There is no hypophonia. There is no lip, neck/head, jaw or voice tremor. Neck is  supple with full range of passive and active motion. There are no carotid bruits on auscultation. Oropharynx exam reveals: mild mouth dryness, good dental hygiene and moderate airway crowding, due to small air

## 2021-06-21 ENCOUNTER — Encounter: Payer: Self-pay | Admitting: Hematology and Oncology

## 2021-06-26 ENCOUNTER — Ambulatory Visit: Payer: 59 | Admitting: Hematology and Oncology

## 2021-06-28 ENCOUNTER — Inpatient Hospital Stay: Payer: 59 | Attending: Hematology and Oncology | Admitting: Hematology and Oncology

## 2021-06-28 ENCOUNTER — Other Ambulatory Visit: Payer: Self-pay

## 2021-06-28 DIAGNOSIS — Z9221 Personal history of antineoplastic chemotherapy: Secondary | ICD-10-CM | POA: Diagnosis not present

## 2021-06-28 DIAGNOSIS — C50312 Malignant neoplasm of lower-inner quadrant of left female breast: Secondary | ICD-10-CM | POA: Diagnosis present

## 2021-06-28 DIAGNOSIS — G629 Polyneuropathy, unspecified: Secondary | ICD-10-CM | POA: Insufficient documentation

## 2021-06-28 DIAGNOSIS — Z171 Estrogen receptor negative status [ER-]: Secondary | ICD-10-CM | POA: Insufficient documentation

## 2021-06-28 DIAGNOSIS — Z923 Personal history of irradiation: Secondary | ICD-10-CM | POA: Diagnosis not present

## 2021-06-28 NOTE — Assessment & Plan Note (Addendum)
1.03/22/2016: Left breast biopsy 8:00: IDC high-grade with necrosis, left axillary lymph node biopsy IDC, ER 0%, PR 0%, HER-2 negative ratio 1.42, Ki-67 90%; palpable lump 3.5 x 2.6 x 2.2 cm and 2.8 Center left axillary lymph node, T2 N1 stage IIB 2. Neoadjuvant chemotherapy with Adriamycin and Cytoxan dose dense 4 followed by Taxolweekly 12completed 08/30/2016 2.09/20/2018Right lumpectomy: No malignancy identified. Complete pathologic response. 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 Adjuvant radiation therapy 12/04/2016-01/14/2017 ------------------------------------------------------------------------------------------------------------------------------------------------------- Chemotherapy-induced neuropathy:much improved, no longer taking gabapentin or Cymbalta. Ankle pain:I discussed with her the importance of losing weight and exercising.  Breast cancer surveillance: 1.Breast exam  06/28/2021: Benign 2.Breast MRI 01/11/2020: Benign, annual breast MRIs are recommended due to greater than 20% lifetime risk 3.Mammogram  01/17/2021: Benign, breast density category C  Return to clinic in 1 year for follow-up She wishes to follow-up with us once a year.  We will set up an appointment for her to come back in a year 

## 2021-06-28 NOTE — Progress Notes (Signed)
Patient Care Team: Nche, Charlene Brooke, NP as PCP - General (Internal Medicine) Alphonsa Overall, MD as Consulting Physician (General Surgery) Nicholas Lose, MD as Consulting Physician (Hematology and Oncology) Eppie Gibson, MD as Attending Physician (Radiation Oncology) Gardenia Phlegm, NP as Nurse Practitioner (Hematology and Oncology)  DIAGNOSIS:  Encounter Diagnosis  Name Primary?   Malignant neoplasm of lower-inner quadrant of left breast in female, estrogen receptor negative ([place])     SUMMARY OF ONCOLOGIC HISTORY: Oncology History  Malignant neoplasm of lower-inner quadrant of left breast in female, estrogen receptor negative ([place])  03/22/2016 Initial Diagnosis   Left breast biopsy 8:00: IDC high-grade with necrosis, left axillary lymph node biopsy IDC, ER 0%, PR 0%, HER-2 negative ratio 1.42, Ki-67 90%; palpable lump 3.5 x 2.6 x 2.2 cm and 2.8 Center left axillary lymph node, T2 N1 stage IIB    04/10/2016 Initial Biopsy   Additional biopsies left breast 9:30: Sclerosed intraductal papilloma with every 3 hours, right breast 1:00: Complex sclerosing lesion with UDH, right breast 8:00: Complex sclerosing lesion with UDH    04/17/2016 - 08/28/2016 Neo-Adjuvant Chemotherapy   Dose dense Adriamycin and Cytoxan 4 followed by Taxol weekly 12    05/15/2016 Genetic Testing   Genetic counseling and testing for hereditary cancer syndromes performed on 05/15/2016. Results are negative for pathogenic mutations in 46 genes analyzed by [facility]'s Common Hereditary Cancers Panel. Results are dated 05/24/2016. Genes tested: APC, ATM, AXIN2, BARD1, BMPR1A, BRCA1, BRCA2, BRIP1, CDH1, CDKN2A, CHEK2, CTNNA1, DICER1, EPCAM, GREM1, HOXB13, KIT, MEN1, MLH1, MSH2, MSH3, MSH6, MUTYH, NBN, NF1, NTHL1, PALB2, PDGFRA, PMS2, POLD1, POLE, PTEN, RAD50, RAD51C, RAD51D, SDHA, SDHB, SDHC, SDHD, SMAD4, SMARCA4, STK11, TP53, TSC1, TSC2, and VHL.  A variant of uncertain significance (not clinically actionable)  was noted in MUTYH.      10/24/2016 Surgery   Right lumpectomy: No malignancy identified. Complete pathologic response. 0/4 lymph nodes negative, previously triple negative disease with a Ki-67 of 90%    12/04/2016 - 01/14/2017 Radiation Therapy   Adjuvant radiation therapy      CHIEF COMPLIANT: Follow-up on neuropathy  INTERVAL HISTORY: Susan Morrison is a 51 y.o. 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 She presents to the clinic today for annual follow-up. She states that she is dealing with the neuropathy without taking anything. States that it has gotten better. Denies any lumps or nodules in the breast or chest wall or axilla   ALLERGIES:  is allergic to ibuprofen.  MEDICATIONS:  Current Outpatient Medications  Medication Sig Dispense Refill   Multiple Vitamins-Minerals (MULTIVITAMIN WITH MINERALS) tablet Take 1 tablet by mouth daily.     Vitamin D, Ergocalciferol, (DRISDOL) 1.25 MG (50000 UNIT) CAPS capsule Take 1 capsule (50,000 Units total) by mouth every 7 (seven) days. 12 capsule 0   No current facility-administered medications for this visit.    PHYSICAL EXAMINATION: ECOG PERFORMANCE STATUS: 1 - Symptomatic but completely ambulatory  Vitals:   06/28/21 1438  BP: 123/82  Pulse: 69  Resp: 19  Temp: 97.7 F (36.5 C)  SpO2: 98%   Filed Weights   06/28/21 1438  Weight: 255 lb 3.2 oz (115.8 kg)    BREAST: No palpable masses or nodules in either right or left breasts. No palpable axillary supraclavicular or infraclavicular adenopathy no breast tenderness or nipple discharge. (exam performed in the presence of a chaperone)  LABORATORY DATA:  I have reviewed the data as listed    Latest Ref Rng & Units 02/08/2021  9:09 AM 05/08/2020   11:53 AM 03/26/2018    9:59 AM  CMP  Glucose 70 - 99 mg/dL 93   89   89    BUN 6 - 23 mg/dL _0 Creatinine 0.40 - 1.20 mg/dL 0.68   0.62   0.71    Sodium 135 - 145 mEq/L 138   139    138    Potassium 3.5 - 5.1 mEq/L 4.0   4.3   4.3    Chloride 96 - 112 mEq/L 104   104   104    CO2 19 - 32 mEq/L _1 Calcium 8.4 - 10.5 mg/dL 9.0   9.4   9.3    Total Protein 6.0 - 8.3 g/dL 7.1   7.2   7.1    Total Bilirubin 0.2 - 1.2 mg/dL 1.0   0.9   0.6    Alkaline Phos 39 - 117 U/L 53   53   71    AST 0 - 37 U/L 45   53   31    ALT 0 - 35 U/L 59   79   41      Lab Results  Component Value Date   WBC 5.1 02/08/2021   HGB 13.6 02/08/2021   HCT 41.8 02/08/2021   MCV 72.1 (L) 02/08/2021   PLT 181.0 02/08/2021   NEUTROABS 2.4 02/08/2021    ASSESSMENT & PLAN:  Malignant neoplasm of lower-inner quadrant of left breast in female, estrogen receptor negative ([place]) 1. 03/22/2016: Left breast biopsy 8:00: IDC high-grade with necrosis, left axillary lymph node biopsy IDC, ER 0%, PR 0%, HER-2 negative ratio 1.42, Ki-67 90%; palpable lump 3.5 x 2.6 x 2.2 cm and 2.8 Center left axillary lymph node, T2 N1 stage IIB 2. Neoadjuvant chemotherapy with Adriamycin and Cytoxan dose dense 4 followed by Taxol weekly 12  completed 08/30/2016 2. 10/24/2016 Right lumpectomy: No malignancy identified. Complete pathologic response. 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 Adjuvant radiation therapy 12/04/2016-01/14/2017 -------------------------------------------------------------------------------------------------------------------------------------------------------  Chemotherapy-induced neuropathy: much improved, no longer taking gabapentin or Cymbalta. Ankle pain: I discussed with her the importance of losing weight and exercising.   Breast cancer surveillance: 1.  Breast exam  06/28/2021: Benign 2.  Breast MRI 01/11/2020: Benign, annual breast MRIs are recommended due to greater than 20% lifetime risk 3.  Mammogram  01/17/2021: Benign, breast density category C   Return to clinic in 1 year for follow-up She wishes to follow-up with [place] once a  year.  We will set up an appointment for her to come back in a year She has changed her job and is now in the last stressful environment.  No orders of the defined types were placed in this encounter.  The patient has a good understanding of the overall plan. she agrees with it. she will call with any problems that may develop before the next visit here. Total time spent: 30 mins including face to face time and time spent for planning, charting and co-ordination of care   Harriette Ohara, MD 06/28/21    I Gardiner Coins am scribing for Dr. Lindi Adie  I have reviewed the above documentation for accuracy and completeness, and I agree with the above.

## 2021-06-29 ENCOUNTER — Telehealth: Payer: Self-pay | Admitting: Hematology and Oncology

## 2021-06-29 NOTE — Telephone Encounter (Signed)
Scheduled appointment per 5/25 los. Left message.

## 2021-07-11 ENCOUNTER — Telehealth: Payer: Self-pay | Admitting: Neurology

## 2021-07-11 NOTE — Telephone Encounter (Signed)
[employer] no Josem Kaufmann req spoke to [place] ref # [id number]. Patient is scheduled at [facility] for 07/24/21 at 3:30 pm.

## 2021-07-24 ENCOUNTER — Ambulatory Visit: Payer: 59 | Admitting: Neurology

## 2021-07-24 DIAGNOSIS — Z820 Family history of epilepsy and other diseases of the nervous system: Secondary | ICD-10-CM

## 2021-07-24 DIAGNOSIS — G4733 Obstructive sleep apnea (adult) (pediatric): Secondary | ICD-10-CM

## 2021-07-24 DIAGNOSIS — R0681 Apnea, not elsewhere classified: Secondary | ICD-10-CM

## 2021-07-24 DIAGNOSIS — R519 Headache, unspecified: Secondary | ICD-10-CM

## 2021-07-24 DIAGNOSIS — R0683 Snoring: Secondary | ICD-10-CM

## 2021-07-26 NOTE — Procedures (Signed)
[facility]  HOME SLEEP TEST (Watch PAT) REPORT  STUDY DATE: 07/24/21  DOB: 02/20/1970  MRN: [id number]  ORDERING CLINICIAN: Star Age, MD, PhD   REFERRING CLINICIAN: Nche, Charlene Brooke, NP   CLINICAL INFORMATION/HISTORY: 51 year old right-handed woman with an underlying medical history of breast cancer, status postlumpectomy, status post chemotherapy and radiation therapy, history of kidney stones, anemia, arthritis, and severe obesity with a BMI of over 40, who reports snoring and excessive daytime somnolence as well as recurrent headaches including morning headaches.    Epworth sleepiness score: 6/24.  BMI: 40 kg/m  FINDINGS:   Sleep Summary:   Total Recording Time (hours, min): 8 hours, 34 minutes  Total Sleep Time (hours, min):  7 hours, 40 minutes   Percent REM (%):    29.1%   Respiratory Indices:   Calculated pAHI (per hour):  48.5/hour         REM pAHI:    71.3/hour       NREM pAHI: 39.4/hour  Oxygen Saturation Statistics:    Oxygen Saturation (%) Mean: 94%   Minimum oxygen saturation (%):                 75%   O2 Saturation Range (%): 75-100%    O2 Saturation (minutes) <=88%: 8.4 min  Pulse Rate Statistics:   Pulse Mean (bpm):    60/min    Pulse Range (49-98/min)   IMPRESSION: OSA (obstructive sleep apnea), severe  RECOMMENDATION:  This home sleep test demonstrates severe obstructive sleep apnea with a total AHI of 48.5/hour and O2 nadir of 75%.  Mild to moderate snoring was detected, at times in the louder range.  Treatment with positive airway pressure is highly recommended. This will require - ideally - a full night CPAP titration study for proper treatment settings, O2 monitoring and mask fitting. For now, the patient will be advised to proceed with an autoPAP titration/trial at home.  A laboratory attended titration study can be considered in the future for optimization of his treatment and better tolerance of therapy.   Alternative treatment options are limited secondary to the severity of the patient's sleep disordered breathing, but may include inspire, hypoglossal nerve stimulator and carefully selected patients.  Concomitant weight loss is highly recommended.  Please note, that untreated obstructive sleep apnea may carry additional perioperative morbidity. Patients with significant obstructive sleep apnea should receive perioperative PAP therapy and the surgeons and particularly the anesthesiologist should be informed of the diagnosis and the severity of the sleep disordered breathing. The patient should be cautioned not to drive, work at heights, or operate dangerous or heavy equipment when tired or sleepy. Review and reiteration of good sleep hygiene measures should be pursued with any patient. Other causes of the patient's symptoms, including circadian rhythm disturbances, an underlying mood disorder, medication effect and/or an underlying medical problem cannot be ruled out based on this test. Clinical correlation is recommended. The patient and her referring provider will be notified of the test results. The patient will be seen in follow up in sleep clinic at [facility].  I certify that I have reviewed the raw data recording prior to the issuance of this report in accordance with the standards of the American Academy of Sleep Medicine (AASM).   INTERPRETING PHYSICIAN:   Star Age, MD, PhD  Board Certified in Neurology and Sleep Medicine  [facility] [name] [place], [place] [address] [phone]

## 2021-08-02 ENCOUNTER — Telehealth: Payer: Self-pay | Admitting: *Deleted

## 2021-08-02 NOTE — Telephone Encounter (Signed)
-----   Message from Star Age, MD sent at 07/26/2021  5:11 PM EDT ----- Patient referred by primary care nurse practitioner, seen by me on 06/11/2021, patient had a HST on 07/24/2021.   Please expedite referral to DME company for AutoPap set up.    Please call and notify the patient that the recent home sleep test showed obstructive sleep apnea in the severe range. I recommend treatment for this in the form of autoPAP, which means, that we don't have to bring her in for a sleep study with CPAP, but will let her start using a so called autoPAP machine at home, through a DME company (of her choice, or as per insurance requirement). The DME representative will fit the patient with a mask of choice, educate her on how to use the machine, how to put the mask on, etc. I have placed an order in the chart. Please send the order to a local DME, talk to patient, send report to referring MD. Please also reinforce the need for compliance with treatment. We will need a FU in sleep clinic for 10 weeks post-PAP set up, please arrange that with me or one of our NPs. Thanks,   Star Age, MD, PhD [facility])

## 2021-08-02 NOTE — Telephone Encounter (Signed)
Called pt to discuss sleep study results. She was unable to discuss but asked for a call back another day. Will call patient next Monday morning when office open.

## 2021-08-09 NOTE — Telephone Encounter (Signed)
Spoke with patient, discussed sleep study results, and proposed plan of care. Pt verbalized understanding that she has severe OSA. She agrees to plan to start autoPAP. Pt aware of insurance compliance requirements which includes using the machine at least 4 hours at night and also being seen in the office between 30 and 90 days after setup. Pt does not have preference of DME. [facility] is close to her and takes her insurance, [employer]. She understands [facility] will call her to schedule a setup appt. Pt scheduled an initial f/u for 10/18/21 at 2:15 PM arrival 15-30 minutes early with machine and power cord. Pt verbalized appreciation for the call and her questions were answered.   Order, office note, sleep study, insurance info faxed to [place]. Received a receipt of confirmation. F/u letter sent to pt via mychart per her preference.

## 2021-08-12 ENCOUNTER — Encounter: Payer: Self-pay | Admitting: Nurse Practitioner

## 2021-08-12 DIAGNOSIS — G4733 Obstructive sleep apnea (adult) (pediatric): Secondary | ICD-10-CM | POA: Insufficient documentation

## 2021-08-12 HISTORY — DX: Obstructive sleep apnea (adult) (pediatric): G47.33

## 2021-10-17 NOTE — Telephone Encounter (Signed)
In preparation for initial f/u tomorrow, checked [employer]. No usage noted. I called [facility] and spoke with Seth Bake. She stated patient had not moved forward with CPAP due to being unable to afford therapy w/ payment plan offered. I spoke with the patient. She stated she could not afford the $600 deductible plus the $35/month rental fee. She is starting a new job, etc and cannot afford it right now. She said if there's something else cheaper she can do she would be willing. She is aware CPAP is preferred treatment of sleep apnea. She is not opposed to an oral appliance although she knows its not likely to be covered by insurance. She stated to cancel the appt for tomorrow. Will call call her back with Dr Guadelupe Sabin recommendation.   9/14 appt canceled.

## 2021-10-17 NOTE — Telephone Encounter (Signed)
I called pt and relayed that she may check with her dentist to see if they do oral appliance for sleep apnea or if not then we can refer to another in [place], Dr. Ron Mcisaac, or Dr. Toy Cookey and would place referral.   She will check and then let us know.

## 2021-10-17 NOTE — Telephone Encounter (Signed)
We can make a referral to dentistry if she would like to see if she is a candidate for an oral appliance. We refer to different practices, if she would prefer, she can check with her own dentist first. Please let me know.

## 2021-10-18 ENCOUNTER — Ambulatory Visit: Payer: 59 | Admitting: Neurology

## 2022-02-19 ENCOUNTER — Encounter: Payer: Self-pay | Admitting: Hematology and Oncology

## 2022-03-04 ENCOUNTER — Ambulatory Visit: Payer: 59 | Admitting: Nurse Practitioner

## 2022-03-05 ENCOUNTER — Other Ambulatory Visit: Payer: Self-pay | Admitting: Nurse Practitioner

## 2022-03-05 ENCOUNTER — Other Ambulatory Visit: Payer: Self-pay | Admitting: Hematology and Oncology

## 2022-03-05 DIAGNOSIS — Z1231 Encounter for screening mammogram for malignant neoplasm of breast: Secondary | ICD-10-CM

## 2022-03-05 DIAGNOSIS — E559 Vitamin D deficiency, unspecified: Secondary | ICD-10-CM

## 2022-04-10 ENCOUNTER — Ambulatory Visit (INDEPENDENT_AMBULATORY_CARE_PROVIDER_SITE_OTHER): Payer: 59 | Admitting: Nurse Practitioner

## 2022-04-10 ENCOUNTER — Encounter: Payer: Self-pay | Admitting: Nurse Practitioner

## 2022-04-10 VITALS — BP 110/72 | HR 64 | Temp 98.2°F | Resp 16 | Ht 66.4 in | Wt 253.4 lb

## 2022-04-10 DIAGNOSIS — D500 Iron deficiency anemia secondary to blood loss (chronic): Secondary | ICD-10-CM

## 2022-04-10 DIAGNOSIS — Z0001 Encounter for general adult medical examination with abnormal findings: Secondary | ICD-10-CM

## 2022-04-10 DIAGNOSIS — Z0000 Encounter for general adult medical examination without abnormal findings: Secondary | ICD-10-CM | POA: Diagnosis not present

## 2022-04-10 DIAGNOSIS — E559 Vitamin D deficiency, unspecified: Secondary | ICD-10-CM

## 2022-04-10 DIAGNOSIS — C773 Secondary and unspecified malignant neoplasm of axilla and upper limb lymph nodes: Secondary | ICD-10-CM | POA: Insufficient documentation

## 2022-04-10 DIAGNOSIS — R739 Hyperglycemia, unspecified: Secondary | ICD-10-CM

## 2022-04-10 DIAGNOSIS — G620 Drug-induced polyneuropathy: Secondary | ICD-10-CM

## 2022-04-10 DIAGNOSIS — E7800 Pure hypercholesterolemia, unspecified: Secondary | ICD-10-CM | POA: Diagnosis not present

## 2022-04-10 DIAGNOSIS — Z23 Encounter for immunization: Secondary | ICD-10-CM | POA: Diagnosis not present

## 2022-04-10 DIAGNOSIS — T451X5A Adverse effect of antineoplastic and immunosuppressive drugs, initial encounter: Secondary | ICD-10-CM

## 2022-04-10 DIAGNOSIS — G4733 Obstructive sleep apnea (adult) (pediatric): Secondary | ICD-10-CM

## 2022-04-10 NOTE — Progress Notes (Signed)
Complete physical exam  Patient: Susan Morrison   DOB: 04-20-1970   52 y.o. Female  MRN: [id number] Visit Date: 04/10/2022  Subjective:    Chief Complaint  Patient presents with   Annual Exam    Fasting- Yes    Susan Morrison is a 52 y.o. female who presents today for a complete physical exam. She reports consuming a general diet.  Exercise 3x/week cardio exercise.  She generally feels well. She reports sleeping fairly well. She does have additional problems to discuss today.  Vision:Yes Dental:No STD Screen:No  BP Readings from Last 3 Encounters:  04/10/22 110/72  06/28/21 123/82  06/11/21 134/82   Most recent fall risk assessment:    04/10/2022    1:01 PM  [place] in the past year? 0  Number falls in past yr: 0  Injury with Fall? 0  Risk for fall due to : No Fall Risks  Follow up Falls evaluation completed   Depression screen:Yes - Depression  Most recent depression screenings:    04/10/2022    1:01 PM 02/08/2021    8:57 AM  PHQ 2/9 Scores  PHQ - 2 Score 2 0  PHQ- 9 Score  1   HPI  Severe obesity (BMI >= 40) (HCC) She has made lifestyle modifications in last 94month: eliminated sugary drinks and low fat diet, cardio exercise 3x/week-380ms each. She also has upcoming appt this weel with healthy weight management clinic Wt Readings from Last 3 Encounters:  04/10/22 253 lb 6.4 oz (114.9 kg)  06/28/21 255 lb 3.2 oz (115.8 kg)  06/11/21 249 lb (112.9 kg)     OSA (obstructive sleep apnea) Unable to afford CPAP machine  Iron deficiency anemia due to chronic blood loss Repeat cbc and iron panel  Hyperglycemia Repeat hgbA1c  Elevated LDL cholesterol level Repeat lipid panel  Vitamin D deficiency Completed high dose vit. D Repeat Vit D  Past Medical History:  Diagnosis Date   Anemia    Arthritis    [facility]ast cancer ([facility]   LEFT   Breast mass 03/21/2016   left 7:00   Genetic testing 06/03/2016   Ms. Valek underwent genetic counseling  and testing for hereditary cancer syndromes on 05/15/2016. Her results were negative for mutations in all 46 genes analyzed by [facility]'s 46-gene Common Hereditary Cancers Panel. Genes analyzed include: APC, ATM, AXIN2, BARD1, BMPR1A, BRCA1, BRCA2, BRIP1, CDH1, CDKN2A, CHEK2, CTNNA1, DICER1, EPCAM, GREM1, HOXB13, KIT, MEN1, MLH1, MSH2, MSH3, MSH6, MUTYH, NBN,   History of blood transfusion 2019   due to breast cancer and needing transfusion   History of kidney stones    History of radiation therapy 12/04/16-01/14/17   left breast 50.4 Gy in 28 fractions   OSA (obstructive sleep apnea) 08/12/2021   Personal history of chemotherapy    March-June 2018   Personal history of radiation therapy    Oct-Dec 2018   PONV (postoperative nausea and vomiting)    Past Surgical History:  Procedure Laterality Date   ABDOMINAL HYSTERECTOMY  03/05/2018   BREAST LUMPECTOMY Left 2018   CYSTOSCOPY N/A 03/05/2018   Procedure: CYSTOSCOPY;  Surgeon: BaSherlyn HayDO;  Location: [facility] Service: Gynecology;  Laterality: N/A;   DILATION AND CURETTAGE OF UTERUS     last done 2years ago   PORT-A-CATH REMOVAL  12/2016   PORTACATH PLACEMENT N/A 04/15/2016   Procedure: INSERTION PORT-A-CATH WITH [place] Surgeon: NeAlphonsa OverallMD;  Location: [facility];  Service:  General;  Laterality: N/A;   RADIOACTIVE SEED GUIDED AXILLARY SENTINEL LYMPH NODE Left 10/24/2016   Procedure: RADIOACTIVE SEED GUIDED AXILLARY TARGETED SENTINEL LYMPH NODE DISSECTION;  Surgeon: Alphonsa Overall, MD;  Location: [facility];  Service: General;  Laterality: Left;   RADIOACTIVE SEED GUIDED EXCISIONAL BREAST BIOPSY Bilateral 10/24/2016   Procedure: RIGHT BREAST RADIOACTIVE SEED GUIDED EXCISIONAL BREAST BIOPSIES X'S 2, LEFT BREAST RADIOACTIVE SEED GUIDED EXCISIONAL BIOPSY X2;  Surgeon: Alphonsa Overall, MD;  Location: [facility];  Service: General;  Laterality: Bilateral;   ROBOT ASSISTED PYELOPLASTY Left  03/05/2017   Procedure: XI ROBOTIC ASSISTED PYELOPLASTY;  Surgeon: Ardis Hughs, MD;  Location: [facility];  Service: Urology;  Laterality: Left;   SMALL INTESTINE SURGERY     pt denies having had small intestine surg   TOTAL LAPAROSCOPIC HYSTERECTOMY WITH SALPINGECTOMY Bilateral 03/05/2018   Procedure: TOTAL LAPAROSCOPIC HYSTERECTOMY WITH SALPINGECTOMY, poss open;  Surgeon: Sherlyn Hay, DO;  Location: [facility];  Service: Gynecology;  Laterality: Bilateral;  Poss Open Hysterectomy   TUBAL LIGATION  1996   Social History   Socioeconomic History   Marital status: Married    Spouse name: Not on file   Number of children: 3   Years of education: 14   Highest education level: Not on file  Occupational History   Occupation: Best boy: [employer]  Tobacco Use   Smoking status: Never   Smokeless tobacco: Never  Vaping Use   Vaping Use: Never used  Substance and Sexual Activity   Alcohol use: Not Currently    Comment: wine/social   Drug use: No   Sexual activity: Yes    Birth control/protection: Surgical    Comment: tubal ligation and hysterectomy  Other Topics Concern   Not on file  Social History Narrative   Lives with fiancee in a one story home.     Works as a Freight forwarder for [employer].     Education: 2 years of college.    Social Determinants of Health   Financial Resource Strain: Not on file  Food Insecurity: Not on file  Transportation Needs: Not on file  Physical Activity: Not on file  Stress: Not on file  Social Connections: Not on file  Intimate Partner Violence: Not on file   Family Status  Relation Name Status   Mother  Alive   Father  Deceased   MGM  Deceased   MGF  Deceased   PGM  Deceased   PGF  Deceased   Family History  Problem Relation Age of Onset   Hypertension Mother    Diabetes Mother    Sleep apnea Mother    Other Father        Homicide   Breast cancer Paternal Grandmother 60       d.62 bilateral  breast cancer   Allergies  Allergen Reactions   Ibuprofen Other (See Comments)    Kidney issues     Patient Care Team: Lorella Gomez, Charlene Brooke, NP as PCP - General (Internal Medicine) Alphonsa Overall, MD as Consulting Physician (General Surgery) Nicholas Lose, MD as Consulting Physician (Hematology and Oncology) Eppie Gibson, MD as Attending Physician (Radiation Oncology) Delice Bison Charlestine Massed, NP as Nurse Practitioner (Hematology and Oncology)   Medications: Outpatient Medications Prior to Visit  Medication Sig   Multiple Vitamins-Minerals (MULTIVITAMIN WITH MINERALS) tablet Take 1 tablet by mouth daily.   [DISCONTINUED] Vitamin D, Ergocalciferol, (DRISDOL) 1.25 MG (50000 UNIT) CAPS capsule Take 1 capsule (  50,000 Units total) by mouth every 7 (seven) days.   No facility-administered medications prior to visit.   Review of Systems  Constitutional:  Negative for activity change, appetite change and unexpected weight change.  Respiratory: Negative.    Cardiovascular: Negative.   Gastrointestinal: Negative.   Endocrine: Negative for cold intolerance and heat intolerance.  Genitourinary: Negative.   Musculoskeletal: Negative.   Skin: Negative.   Neurological: Negative.   Hematological: Negative.   Psychiatric/Behavioral:  Positive for dysphoric mood and sleep disturbance. Negative for behavioral problems, decreased concentration, hallucinations, self-injury and suicidal ideas. The patient is not nervous/anxious.        Objective:  BP 110/72 (BP Location: Left Arm, Patient Position: Sitting, Cuff Size: Large)   Pulse 64   Temp 98.2 F (36.8 C) (Temporal)   Resp 16   Ht 5' 6.4" (1.687 m)   Wt 253 lb 6.4 oz (114.9 kg)   LMP 11/11/2017 (Approximate)   SpO2 95%   BMI 40.41 kg/m     Physical Exam Vitals and nursing note reviewed.  Constitutional:      General: She is not in acute distress. HENT:     Right Ear: Tympanic membrane, ear canal and external ear normal.     Left  Ear: Tympanic membrane, ear canal and external ear normal.     Nose: Nose normal.  Eyes:     Extraocular Movements: Extraocular movements intact.     Conjunctiva/sclera: Conjunctivae normal.     Pupils: Pupils are equal, round, and reactive to light.  Neck:     Thyroid: No thyroid mass, thyromegaly or thyroid tenderness.  Cardiovascular:     Rate and Rhythm: Normal rate and regular rhythm.     Pulses: Normal pulses.     Heart sounds: Normal heart sounds.  Pulmonary:     Effort: Pulmonary effort is normal.     Breath sounds: Normal breath sounds.  Abdominal:     General: Bowel sounds are normal.     Palpations: Abdomen is soft.  Musculoskeletal:        General: Normal range of motion.     Cervical back: Normal range of motion and neck supple.     Right lower leg: No edema.     Left lower leg: No edema.  Lymphadenopathy:     Cervical: No cervical adenopathy.  Skin:    General: Skin is warm and dry.  Neurological:     Mental Status: She is alert and oriented to person, place, and time.     Cranial Nerves: No cranial nerve deficit.  Psychiatric:        Mood and Affect: Mood normal.        Behavior: Behavior normal.        Thought Content: Thought content normal.     No results found for any visits on 04/10/22.    Assessment & Plan:    Routine Health Maintenance and Physical Exam  Immunization History  Administered Date(s) Administered   Influenza,inj,Quad PF,6+ Mos 03/05/2016, 11/07/2020, 11/06/2021   Influenza-Unspecified 10/01/2016, 09/14/2017, 10/01/2018   PFIZER(Purple Top)SARS-COV-2 Vaccination 05/13/2019, 06/07/2019   PNEUMOCOCCAL CONJUGATE-20 02/08/2021   Tdap 11/07/2020   Zoster Recombinat (Shingrix) 04/10/2022    Health Maintenance  Topic Date Due   COVID-19 Vaccine (3 - Pfizer risk series) 04/26/2022 (Originally 07/05/2019)   Hepatitis C Screening  04/10/2023 (Originally [birth year]1989)   Zoster Vaccines- Shingrix (2 of 2) 06/05/2022   MAMMOGRAM  03/26/2023    DTaP/Tdap/Td (2 - Td or Tdap) 11/08/2030  COLONOSCOPY (Pts 45-37yr Insurance coverage will need to be confirmed)  02/13/2031   INFLUENZA VACCINE  Completed   HIV Screening  Completed   HPV VACCINES  Aged Out   PAP SMEAR-Modifier  Discontinued    Discussed health benefits of physical activity, and encouraged her to engage in regular exercise appropriate for her age and condition.  Problem List Items Addressed This Visit       Respiratory   OSA (obstructive sleep apnea)    Unable to afford CPAP machine        Nervous and Auditory   Chemotherapy-induced peripheral neuropathy (HCC)     Other   Elevated LDL cholesterol level    Repeat lipid panel      Relevant Orders   Lipid panel   Hyperglycemia    Repeat hgbA1c      Relevant Orders   Hemoglobin A1c   Iron deficiency anemia due to chronic blood loss    Repeat cbc and iron panel      Relevant Orders   CBC   IBC + Ferritin   Severe obesity (BMI >= 40) (HCC)    She has made lifestyle modifications in last 345month eliminated sugary drinks and low fat diet, cardio exercise 3x/week-3083m each. She also has upcoming appt this weel with healthy weight management clinic Wt Readings from Last 3 Encounters:  04/10/22 253 lb 6.4 oz (114.9 kg)  06/28/21 255 lb 3.2 oz (115.8 kg)  06/11/21 249 lb (112.9 kg)         Relevant Orders   Hemoglobin A1c   Vitamin D deficiency    Completed high dose vit. D Repeat Vit D      Relevant Orders   VITAMIN D 25 Hydroxy (Vit-D Deficiency, Fractures)   Other Visit Diagnoses     Encounter for preventative adult health care exam with abnormal findings    -  Primary   Relevant Orders   Comprehensive metabolic panel   Immunization due       Relevant Orders   Zoster Recombinant (Shingrix ) (Completed)      Return in about 1 year (around 04/10/2023) for CPE (fasting).     ChaWilfred LacyP

## 2022-04-10 NOTE — Patient Instructions (Addendum)
Go to lab  Preventive Care 40-51 Years Old, Female Preventive care refers to lifestyle choices and visits with your health care provider that can promote health and wellness. Preventive care visits are also called wellness exams. What can I expect for my preventive care visit? Counseling Your health care provider may ask you questions about your: Medical history, including: Past medical problems. Family medical history. Pregnancy history. Current health, including: Menstrual cycle. Method of birth control. Emotional well-being. Home life and relationship well-being. Sexual activity and sexual health. Lifestyle, including: Alcohol, nicotine or tobacco, and drug use. Access to firearms. Diet, exercise, and sleep habits. Work and work environment. Sunscreen use. Safety issues such as seatbelt and bike helmet use. Physical exam Your health care provider will check your: Height and weight. These may be used to calculate your BMI (body mass index). BMI is a measurement that tells if you are at a healthy weight. Waist circumference. This measures the distance around your waistline. This measurement also tells if you are at a healthy weight and may help predict your risk of certain diseases, such as type 2 diabetes and high blood pressure. Heart rate and blood pressure. Body temperature. Skin for abnormal spots. What immunizations do I need?  Vaccines are usually given at various ages, according to a schedule. Your health care provider will recommend vaccines for you based on your age, medical history, and lifestyle or other factors, such as travel or where you work. What tests do I need? Screening Your health care provider may recommend screening tests for certain conditions. This may include: Lipid and cholesterol levels. Diabetes screening. This is done by checking your blood sugar (glucose) after you have not eaten for a while (fasting). Pelvic exam and Pap test. Hepatitis B  test. Hepatitis C test. HIV (human immunodeficiency virus) test. STI (sexually transmitted infection) testing, if you are at risk. Lung cancer screening. Colorectal cancer screening. Mammogram. Talk with your health care provider about when you should start having regular mammograms. This may depend on whether you have a family history of breast cancer. BRCA-related cancer screening. This may be done if you have a family history of breast, ovarian, tubal, or peritoneal cancers. Bone density scan. This is done to screen for osteoporosis. Talk with your health care provider about your test results, treatment options, and if necessary, the need for more tests. Follow these instructions at home: Eating and drinking  Eat a diet that includes fresh fruits and vegetables, whole grains, lean protein, and low-fat dairy products. Take vitamin and mineral supplements as recommended by your health care provider. Do not drink alcohol if: Your health care provider tells you not to drink. You are pregnant, may be pregnant, or are planning to become pregnant. If you drink alcohol: Limit how much you have to 0-1 drink a day. Know how much alcohol is in your drink. In the [place] one drink equals one 12 oz bottle of beer (355 mL), one 5 oz glass of wine (148 mL), or one 1 oz glass of hard liquor (44 mL). Lifestyle Brush your teeth every morning and night with fluoride toothpaste. Floss one time each day. Exercise for at least 30 minutes 5 or more days each week. Do not use any products that contain nicotine or tobacco. These products include cigarettes, chewing tobacco, and vaping devices, such as e-cigarettes. If you need help quitting, ask your health care provider. Do not use drugs. If you are sexually active, practice safe sex. Use a condom or other   form of protection to prevent STIs. If you do not wish to become pregnant, use a form of birth control. If you plan to become pregnant, see your health care  provider for a prepregnancy visit. Take aspirin only as told by your health care provider. Make sure that you understand how much to take and what form to take. Work with your health care provider to find out whether it is safe and beneficial for you to take aspirin daily. Find healthy ways to manage stress, such as: Meditation, yoga, or listening to music. Journaling. Talking to a trusted person. Spending time with friends and family. Minimize exposure to UV radiation to reduce your risk of skin cancer. Safety Always wear your seat belt while driving or riding in a vehicle. Do not drive: If you have been drinking alcohol. Do not ride with someone who has been drinking. When you are tired or distracted. While texting. If you have been using any mind-altering substances or drugs. Wear a helmet and other protective equipment during sports activities. If you have firearms in your house, make sure you follow all gun safety procedures. Seek help if you have been physically or sexually abused. What's next? Visit your health care provider once a year for an annual wellness visit. Ask your health care provider how often you should have your eyes and teeth checked. Stay up to date on all vaccines. This information is not intended to replace advice given to you by your health care provider. Make sure you discuss any questions you have with your health care provider. Document Revised: 07/19/2020 Document Reviewed: 07/19/2020 Elsevier Patient Education  2023 Elsevier Inc.  

## 2022-04-10 NOTE — Assessment & Plan Note (Signed)
Repeat lipid panel ?

## 2022-04-10 NOTE — Assessment & Plan Note (Signed)
Repeat cbc and iron panel

## 2022-04-10 NOTE — Assessment & Plan Note (Signed)
She has made lifestyle modifications in last 61month: eliminated sugary drinks and low fat diet, cardio exercise 3x/week-378ms each. She also has upcoming appt this weel with healthy weight management clinic Wt Readings from Last 3 Encounters:  04/10/22 253 lb 6.4 oz (114.9 kg)  06/28/21 255 lb 3.2 oz (115.8 kg)  06/11/21 249 lb (112.9 kg)

## 2022-04-10 NOTE — Assessment & Plan Note (Signed)
Repeat hgbA1c 

## 2022-04-10 NOTE — Assessment & Plan Note (Signed)
Completed high dose vit. D Repeat Vit D

## 2022-04-10 NOTE — Assessment & Plan Note (Signed)
Unable to afford CPAP machine

## 2022-04-10 NOTE — Assessment & Plan Note (Signed)
>>  ASSESSMENT AND PLAN FOR HYPERGLYCEMIA WRITTEN ON 04/10/2022  1:42 PM BY Perseus Westall LUM, NP  Repeat hgbA1c

## 2022-04-11 LAB — IBC + FERRITIN
Ferritin: 234.4 ng/mL (ref 10.0–291.0)
Iron: 72 ug/dL (ref 42–145)
Saturation Ratios: 25.2 % (ref 20.0–50.0)
TIBC: 285.6 ug/dL (ref 250.0–450.0)
Transferrin: 204 mg/dL — ABNORMAL LOW (ref 212.0–360.0)

## 2022-04-11 LAB — LIPID PANEL
Cholesterol: 171 mg/dL (ref 0–200)
HDL: 41.9 mg/dL (ref >39.00)
LDL Cholesterol: 98 mg/dL (ref 0–99)
NonHDL: 128.77
Total CHOL/HDL Ratio: 4
Triglycerides: 153 mg/dL — ABNORMAL HIGH (ref 0.0–149.0)
VLDL: 30.6 mg/dL (ref 0.0–40.0)

## 2022-04-11 LAB — COMPREHENSIVE METABOLIC PANEL
ALT: 60 U/L — ABNORMAL HIGH (ref 0–35)
AST: 44 U/L — ABNORMAL HIGH (ref 0–37)
Albumin: 3.8 g/dL (ref 3.5–5.2)
Alkaline Phosphatase: 57 U/L (ref 39–117)
BUN: 14 mg/dL (ref 6–23)
CO2: 28 mEq/L (ref 19–32)
Calcium: 9.5 mg/dL (ref 8.4–10.5)
Chloride: 103 mEq/L (ref 96–112)
Creatinine, Ser: 0.71 mg/dL (ref 0.40–1.20)
GFR: 99.07 mL/min (ref >60.00)
Glucose, Bld: 78 mg/dL (ref 70–99)
Potassium: 3.8 mEq/L (ref 3.5–5.1)
Sodium: 138 mEq/L (ref 135–145)
Total Bilirubin: 1 mg/dL (ref 0.2–1.2)
Total Protein: 7.2 g/dL (ref 6.0–8.3)

## 2022-04-11 LAB — CBC
HCT: 41.6 % (ref 36.0–46.0)
Hemoglobin: 13.9 g/dL (ref 12.0–15.0)
MCHC: 33.5 g/dL (ref 30.0–36.0)
MCV: 71.5 fl — ABNORMAL LOW (ref 78.0–100.0)
Platelets: 187 10*3/uL (ref 150.0–400.0)
RBC: 5.82 Mil/uL — ABNORMAL HIGH (ref 3.87–5.11)
RDW: 17.3 % — ABNORMAL HIGH (ref 11.5–15.5)
WBC: 4.9 10*3/uL (ref 4.0–10.5)

## 2022-04-11 LAB — VITAMIN D 25 HYDROXY (VIT D DEFICIENCY, FRACTURES): VITD: 20.06 ng/mL — ABNORMAL LOW (ref 30.00–100.00)

## 2022-04-11 LAB — HEMOGLOBIN A1C: Hgb A1c MFr Bld: 6.2 % (ref 4.6–6.5)

## 2022-04-11 NOTE — Progress Notes (Signed)
Abnormal: Stable cbc, CMP, and iron panel Low vit. D: start OTC dose 2000IU daily Increase in hgbA1c : 6.0 to 6.2% prediabetes Elevated triglyceride Try mediterranean diet to help improve glucose control and triglyceride

## 2022-05-03 ENCOUNTER — Ambulatory Visit
Admission: RE | Admit: 2022-05-03 | Discharge: 2022-05-03 | Disposition: A | Discharge status: Home / Self Care | Payer: 59 | Source: Ambulatory Visit | Attending: Hematology and Oncology | Admitting: Hematology and Oncology

## 2022-05-03 DIAGNOSIS — Z1231 Encounter for screening mammogram for malignant neoplasm of breast: Secondary | ICD-10-CM

## 2022-05-29 ENCOUNTER — Telehealth: Payer: Self-pay | Admitting: Hematology and Oncology

## 2022-05-29 NOTE — Telephone Encounter (Signed)
Rescheduled appointment per room resource. Left voicemail.  

## 2022-06-30 NOTE — Progress Notes (Signed)
Patient Care Team: Nche, Bonna Gains, NP as PCP - General (Internal Medicine) Ovidio Kin, MD as Consulting Physician (General Surgery) Serena Croissant, MD as Consulting Physician (Hematology and Oncology) Lonie Peak, MD as Attending Physician (Radiation Oncology) Loa Socks, NP as Nurse Practitioner (Hematology and Oncology)  DIAGNOSIS:  Encounter Diagnosis  Name Primary?   Malignant neoplasm of lower-inner quadrant of left breast in female, estrogen receptor negative (HCC) Yes    SUMMARY OF ONCOLOGIC HISTORY: Oncology History  Malignant neoplasm of lower-inner quadrant of left breast in female, estrogen receptor negative (HCC)  03/22/2016 Initial Diagnosis   Left breast biopsy 8:00: IDC high-grade with necrosis, left axillary lymph node biopsy IDC, ER 0%, PR 0%, HER-2 negative ratio 1.42, Ki-67 90%; palpable lump 3.5 x 2.6 x 2.2 cm and 2.8 Center left axillary lymph node, T2 N1 stage IIB   04/10/2016 Initial Biopsy   Additional biopsies left breast 9:30: Sclerosed intraductal papilloma with every 3 hours, right breast 1:00: Complex sclerosing lesion with UDH, right breast 8:00: Complex sclerosing lesion with UDH   04/17/2016 - 08/28/2016 Neo-Adjuvant Chemotherapy   Dose dense Adriamycin and Cytoxan 4 followed by Taxol weekly 12   05/15/2016 Genetic Testing   Genetic counseling and testing for hereditary cancer syndromes performed on 05/15/2016. Results are negative for pathogenic mutations in 46 genes analyzed by [facility]'s Common Hereditary Cancers Panel. Results are dated 05/24/2016. Genes tested: APC, ATM, AXIN2, BARD1, BMPR1A, BRCA1, BRCA2, BRIP1, CDH1, CDKN2A, CHEK2, CTNNA1, DICER1, EPCAM, GREM1, HOXB13, KIT, MEN1, MLH1, MSH2, MSH3, MSH6, MUTYH, NBN, NF1, NTHL1, PALB2, PDGFRA, PMS2, POLD1, POLE, PTEN, RAD50, RAD51C, RAD51D, SDHA, SDHB, SDHC, SDHD, SMAD4, SMARCA4, STK11, TP53, TSC1, TSC2, and VHL.  A variant of uncertain significance (not clinically actionable) was  noted in MUTYH.      10/24/2016 Surgery   Right lumpectomy: No malignancy identified. Complete pathologic response. 0/4 lymph nodes negative, previously triple negative disease with a Ki-67 of 90%   12/04/2016 - 01/14/2017 Radiation Therapy   Adjuvant radiation therapy     CHIEF COMPLIANT: Surveillance  INTERVAL HISTORY: Susan Morrison is a  52 y.o. 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 She presents to the clinic today for annual follow-up. She reports the neuropathy comes and goes. She says it is more a sharp pain. She states that she does a lot of lifting with the left arm.   ALLERGIES:  is allergic to ibuprofen.  MEDICATIONS:  Current Outpatient Medications  Medication Sig Dispense Refill   Multiple Vitamins-Minerals (MULTIVITAMIN WITH MINERALS) tablet Take 1 tablet by mouth daily.     No current facility-administered medications for this visit.    PHYSICAL EXAMINATION: ECOG PERFORMANCE STATUS: 1 - Symptomatic but completely ambulatory  Vitals:   07/08/22 1427  BP: 134/71  Pulse: 68  Resp: 18  Temp: (!) 97.5 F (36.4 C)  SpO2: 98%   Filed Weights   07/08/22 1427  Weight: 259 lb (117.5 kg)    BREAST: No palpable masses or nodules in either right or left breasts. No palpable axillary supraclavicular or infraclavicular adenopathy no breast tenderness or nipple discharge. (exam performed in the presence of a chaperone)  LABORATORY DATA:  I have reviewed the data as listed    Latest Ref Rng & Units 04/10/2022    1:59 PM 02/08/2021    9:09 AM 05/08/2020   11:53 AM  CMP  Glucose 70 - 99 mg/dL 78  93  89   BUN 6 - 23 mg/dL 14  16  11   Creatinine 0.40 - 1.20 mg/dL 0.96  0.45  4.09   Sodium 135 - 145 mEq/L 138  138  139   Potassium 3.5 - 5.1 mEq/L 3.8  4.0  4.3   Chloride 96 - 112 mEq/L 103  104  104   CO2 19 - 32 mEq/L 28  27  28    Calcium 8.4 - 10.5 mg/dL 9.5  9.0  9.4   Total Protein 6.0 - 8.3 g/dL 7.2  7.1  7.2   Total Bilirubin  0.2 - 1.2 mg/dL 1.0  1.0  0.9   Alkaline Phos 39 - 117 U/L 57  53  53   AST 0 - 37 U/L 44  45  53   ALT 0 - 35 U/L 60  59  79     Lab Results  Component Value Date   WBC 4.9 04/10/2022   HGB 13.9 04/10/2022   HCT 41.6 04/10/2022   MCV 71.5 (L) 04/10/2022   PLT 187.0 04/10/2022   NEUTROABS 2.4 02/08/2021    ASSESSMENT & PLAN:  Malignant neoplasm of lower-inner quadrant of left breast in female, estrogen receptor negative (HCC) 1. 03/22/2016: Left breast biopsy 8:00: IDC high-grade with necrosis, left axillary lymph node biopsy IDC, ER 0%, PR 0%, HER-2 negative ratio 1.42, Ki-67 90%; palpable lump 3.5 x 2.6 x 2.2 cm and 2.8 Center left axillary lymph node, T2 N1 stage IIB 2. Neoadjuvant chemotherapy with Adriamycin and Cytoxan dose dense 4 followed by Taxol weekly 12  completed 08/30/2016 2. 10/24/2016 Right lumpectomy: No malignancy identified. Complete pathologic response. 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 Adjuvant radiation therapy 12/04/2016-01/14/2017 -------------------------------------------------------------------------------------------------------------------------------------------------------  Chemotherapy-induced neuropathy: much improved, no longer taking gabapentin or Cymbalta. Ankle pain: I discussed with her the importance of losing weight and exercising.   Breast cancer surveillance: 1.  Breast exam 07/08/2022: Benign 2.  Mammogram 05/06/2022: Benign, breast density category C   Return to clinic in 1 year for follow-up   No orders of the defined types were placed in this encounter.  The patient has a good understanding of the overall plan. she agrees with it. she will call with any problems that may develop before the next visit here. Total time spent: 30 mins including face to face time and time spent for planning, charting and co-ordination of care   Tamsen Meek, MD 07/08/22    I Janan Ridge am  acting as a Neurosurgeon for [employer]  I have reviewed the above documentation for accuracy and completeness, and I agree with the above.

## 2022-07-02 ENCOUNTER — Ambulatory Visit: Payer: 59 | Admitting: Hematology and Oncology

## 2022-07-08 ENCOUNTER — Inpatient Hospital Stay: Payer: BLUE CROSS/BLUE SHIELD | Attending: Hematology and Oncology | Admitting: Hematology and Oncology

## 2022-07-08 VITALS — BP 134/71 | HR 68 | Temp 97.5°F | Resp 18 | Ht 66.4 in | Wt 259.0 lb

## 2022-07-08 DIAGNOSIS — C50312 Malignant neoplasm of lower-inner quadrant of left female breast: Secondary | ICD-10-CM | POA: Diagnosis present

## 2022-07-08 DIAGNOSIS — Z923 Personal history of irradiation: Secondary | ICD-10-CM | POA: Insufficient documentation

## 2022-07-08 DIAGNOSIS — Z171 Estrogen receptor negative status [ER-]: Secondary | ICD-10-CM | POA: Insufficient documentation

## 2022-07-08 DIAGNOSIS — Z9221 Personal history of antineoplastic chemotherapy: Secondary | ICD-10-CM | POA: Diagnosis not present

## 2022-07-08 DIAGNOSIS — G629 Polyneuropathy, unspecified: Secondary | ICD-10-CM | POA: Insufficient documentation

## 2022-07-08 NOTE — Assessment & Plan Note (Signed)
1. 03/22/2016: Left breast biopsy 8:00: IDC high-grade with necrosis, left axillary lymph node biopsy IDC, ER 0%, PR 0%, HER-2 negative ratio 1.42, Ki-67 90%; palpable lump 3.5 x 2.6 x 2.2 cm and 2.8 Center left axillary lymph node, T2 N1 stage IIB 2. Neoadjuvant chemotherapy with Adriamycin and Cytoxan dose dense 4 followed by Taxol weekly 12  completed 08/30/2016 2. 10/24/2016 Right lumpectomy: No malignancy identified. Complete pathologic response. 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 Adjuvant radiation therapy 12/04/2016-01/14/2017 -------------------------------------------------------------------------------------------------------------------------------------------------------  Chemotherapy-induced neuropathy: much improved, no longer taking gabapentin or Cymbalta. Ankle pain: I discussed with her the importance of losing weight and exercising.   Breast cancer surveillance: 1.  Breast exam 07/08/2022: Benign 2.  Breast MRI 01/11/2020: Benign, annual breast MRIs are recommended due to greater than 20% lifetime risk.  But she has not done them. 3.  Mammogram 05/06/2022: Benign, breast density category C   Return to clinic in 1 year for follow-up

## 2022-09-16 ENCOUNTER — Encounter: Payer: Self-pay | Admitting: Hematology and Oncology

## 2022-09-16 ENCOUNTER — Encounter: Payer: Self-pay | Admitting: Nurse Practitioner

## 2022-09-16 ENCOUNTER — Ambulatory Visit (INDEPENDENT_AMBULATORY_CARE_PROVIDER_SITE_OTHER): Payer: 59 | Admitting: Nurse Practitioner

## 2022-09-16 VITALS — BP 110/80 | HR 56 | Temp 98.0°F | Resp 16 | Ht 66.4 in | Wt 251.6 lb

## 2022-09-16 DIAGNOSIS — M545 Low back pain, unspecified: Secondary | ICD-10-CM | POA: Diagnosis not present

## 2022-09-16 MED ORDER — ACETAMINOPHEN ER 650 MG PO TBCR: 650.0000 mg | EXTENDED_RELEASE_TABLET | Freq: Three times a day (TID) | ORAL | Status: AC | PRN

## 2022-09-16 MED ORDER — CYCLOBENZAPRINE HCL 5 MG PO TABS: 5.0000 mg | ORAL_TABLET | Freq: Every day | ORAL | 0 refills | Status: DC

## 2022-09-16 MED ORDER — PREDNISONE 20 MG PO TABS: 40.0000 mg | ORAL_TABLET | Freq: Every day | ORAL | 0 refills | Status: DC

## 2022-09-16 NOTE — Progress Notes (Signed)
                Established Patient Visit  Patient: Susan Morrison   DOB: 1970/10/13   52 y.o. Female  MRN: [id number] Visit Date: 09/16/2022  Subjective:    Chief Complaint  Patient presents with   Back Pain    Center lower back pain. Started Saturday evening and has gotten worse yesterday    Back Pain This is a recurrent problem. The current episode started in the past 7 days. The problem occurs constantly. The problem is unchanged. The quality of the pain is described as aching, cramping and shooting. The pain radiates to the left thigh and right thigh. The pain is moderate. The pain is The same all the time. The symptoms are aggravated by standing. Stiffness is present All day. Pertinent negatives include no abdominal pain, bladder incontinence, bowel incontinence, chest pain, dysuria, fever, headaches, leg pain, numbness, paresis, paresthesias, pelvic pain, perianal numbness, tingling, weakness or weight loss. Risk factors include poor posture, obesity and sedentary lifestyle. She has tried analgesics for the symptoms. The treatment provided mild relief.  Hx of Lumbar spine DDD per 2011 x-ray  Reviewed medical, surgical, and social history today  Medications: Outpatient Medications Prior to Visit  Medication Sig   Multiple Vitamins-Minerals (MULTIVITAMIN WITH MINERALS) tablet Take 1 tablet by mouth daily.   No facility-administered medications prior to visit.   Reviewed past medical and social history.   ROS per HPI above      Objective:  BP 110/80 (BP Location: Right Arm, Patient Position: Sitting, Cuff Size: Large)   Pulse (!) 56   Temp 98 F (36.7 C) (Temporal)   Resp 16   Ht 5' 6.4" (1.687 m)   Wt 251 lb 9.6 oz (114.1 kg)   LMP 11/11/2017 (Approximate)   SpO2 98%   BMI 40.12 kg/m      Physical Exam Vitals and nursing note reviewed.  Cardiovascular:     Rate and Rhythm: Normal rate.     Pulses: Normal pulses.  Pulmonary:     Effort: Pulmonary effort is  normal.  Abdominal:     General: There is no distension.     Palpations: Abdomen is soft.     Tenderness: There is no abdominal tenderness. There is no right CVA tenderness, left CVA tenderness or guarding.  Musculoskeletal:     Thoracic back: Normal.     Lumbar back: Spasms and tenderness present. No bony tenderness. Normal range of motion. Negative right straight leg raise test and negative left straight leg raise test.     Right lower leg: No edema.     Left lower leg: No edema.  Neurological:     Mental Status: She is alert.     No results found for any visits on 09/16/22.    Assessment & Plan:    Problem List Items Addressed This Visit   None Visit Diagnoses     Acute bilateral low back pain without sciatica    -  Primary   Relevant Medications   cyclobenzaprine (FLEXERIL) 5 MG tablet   predniSONE (DELTASONE) 20 MG tablet   acetaminophen (TYLENOL 8 HOUR) 650 MG CR tablet      Return if symptoms worsen or fail to improve.     Alysia Penna, NP

## 2022-09-16 NOTE — Patient Instructions (Signed)
Chronic Back Pain Chronic back pain is back pain that lasts longer than 3 months. The pain may get worse at certain times (flare-ups). There are things you can do at home to manage your pain. Follow these instructions at home: Watch for any changes in your symptoms. Take these actions to help with your pain: Managing pain and stiffness     If told, put ice on the painful area. You may be told to use ice for 24-48 hours after a flare-up starts. Put ice in a plastic bag. Place a towel between your skin and the bag. Leave the ice on for 20 minutes, 2-3 times a day. If told, put heat on the painful area. Do this as often as told by your doctor. Use the heat source that your doctor recommends, such as a moist heat pack or a heating pad. Place a towel between your skin and the heat source. Leave the heat on for 20-30 minutes. If your skin turns bright red, take off the ice or heat right away to prevent skin damage. The risk of damage is higher if you cannot feel pain, heat, or cold. Soak in a warm bath. This can help with pain. Activity        Avoid bending and other activities that make the pain worse. When you stand: Keep your upper back and neck straight. Keep your shoulders pulled back. Avoid slouching. When you sit: Keep your back straight. Relax your shoulders. Do not round your shoulders or pull them backward. Do not sit or stand in one place for too long. Take short rest breaks during the day. Lying down or standing is often better than sitting. Resting can help relieve pain. When sitting or lying down for a long time, do some mild activity or stretching. This will help to prevent stiffness and pain. Get regular exercise. Ask your doctor what activities are safe for you. You may have to avoid lifting. Ask your provider how much you can safely lift. If you lift things: Bend your knees. Keep the weight close to your body. Avoid twisting. Medicines Take over-the-counter and  prescription medicines only as told by your doctor. You may need to take medicines for pain and swelling. These may be taken by mouth or put on the skin. You may also be given muscle relaxants. Ask your doctor if the medicine prescribed to you: Requires you to avoid driving or using machinery. Can cause trouble pooping (constipation). You may need to take these actions to prevent or treat trouble pooping: Drink enough fluid to keep your pee (urine) pale yellow. Take over-the-counter or prescription medicines. Eat foods that are high in fiber. These include beans, whole grains, and fresh fruits and vegetables. Limit foods that are high in fat and sugars. These include fried or sweet foods. General instructions  Sleep on a firm mattress. Try lying on your side with your knees slightly bent. If you lie on your back, put a pillow under your knees. Do not smoke or use any products that contain nicotine or tobacco. If you need help quitting, ask your doctor. Contact a doctor if: Your pain does not get better with rest or medicine. You have new pain. You have a fever. You lose weight quickly. You have trouble doing your normal activities. One or both of your legs or feet feel weak. One or both of your legs or feet lose feeling (have numbness). Get help right away if: You are not able to control when you pee   or poop. You have bad back pain and: You feel like you may vomit (nauseous). You vomit. You have pain in your chest or your belly (abdomen). You have shortness of breath. You faint. These symptoms may be an emergency. Get help right away. Call 911. Do not wait to see if the symptoms will go away. Do not drive yourself to the hospital. This information is not intended to replace advice given to you by your health care provider. Make sure you discuss any questions you have with your health care provider. Document Revised: 09/10/2021 Document Reviewed: 09/10/2021 Elsevier Patient Education   2024 [employer]  

## 2023-04-14 ENCOUNTER — Ambulatory Visit (INDEPENDENT_AMBULATORY_CARE_PROVIDER_SITE_OTHER): Payer: 59 | Admitting: Nurse Practitioner

## 2023-04-14 ENCOUNTER — Other Ambulatory Visit: Payer: Self-pay | Admitting: Hematology and Oncology

## 2023-04-14 ENCOUNTER — Encounter: Payer: Self-pay | Admitting: Nurse Practitioner

## 2023-04-14 VITALS — BP 112/80 | HR 60 | Temp 98.0°F | Ht 66.4 in | Wt 255.4 lb

## 2023-04-14 DIAGNOSIS — Z23 Encounter for immunization: Secondary | ICD-10-CM

## 2023-04-14 DIAGNOSIS — E559 Vitamin D deficiency, unspecified: Secondary | ICD-10-CM

## 2023-04-14 DIAGNOSIS — Z0000 Encounter for general adult medical examination without abnormal findings: Secondary | ICD-10-CM

## 2023-04-14 DIAGNOSIS — R739 Hyperglycemia, unspecified: Secondary | ICD-10-CM | POA: Diagnosis not present

## 2023-04-14 DIAGNOSIS — Z1159 Encounter for screening for other viral diseases: Secondary | ICD-10-CM | POA: Diagnosis not present

## 2023-04-14 DIAGNOSIS — D500 Iron deficiency anemia secondary to blood loss (chronic): Secondary | ICD-10-CM | POA: Diagnosis not present

## 2023-04-14 DIAGNOSIS — R7303 Prediabetes: Secondary | ICD-10-CM

## 2023-04-14 DIAGNOSIS — E7800 Pure hypercholesterolemia, unspecified: Secondary | ICD-10-CM | POA: Diagnosis not present

## 2023-04-14 DIAGNOSIS — Z1231 Encounter for screening mammogram for malignant neoplasm of breast: Secondary | ICD-10-CM

## 2023-04-14 LAB — COMPREHENSIVE METABOLIC PANEL
ALT: 62 U/L — ABNORMAL HIGH (ref 0–35)
AST: 43 U/L — ABNORMAL HIGH (ref 0–37)
Albumin: 4 g/dL (ref 3.5–5.2)
Alkaline Phosphatase: 61 U/L (ref 39–117)
BUN: 13 mg/dL (ref 6–23)
CO2: 27 meq/L (ref 19–32)
Calcium: 9 mg/dL (ref 8.4–10.5)
Chloride: 105 meq/L (ref 96–112)
Creatinine, Ser: 0.65 mg/dL (ref 0.40–1.20)
GFR: 101.86 mL/min (ref >60.00)
Glucose, Bld: 94 mg/dL (ref 70–99)
Potassium: 3.8 meq/L (ref 3.5–5.1)
Sodium: 140 meq/L (ref 135–145)
Total Bilirubin: 0.9 mg/dL (ref 0.2–1.2)
Total Protein: 6.9 g/dL (ref 6.0–8.3)

## 2023-04-14 LAB — IBC + FERRITIN
Ferritin: 287.4 ng/mL (ref 10.0–291.0)
Iron: 62 ug/dL (ref 42–145)
Saturation Ratios: 22.5 % (ref 20.0–50.0)
TIBC: 275.8 ug/dL (ref 250.0–450.0)
Transferrin: 197 mg/dL — ABNORMAL LOW (ref 212.0–360.0)

## 2023-04-14 LAB — CBC WITH DIFFERENTIAL/PLATELET
Basophils Absolute: 0 10*3/uL (ref 0.0–0.1)
Basophils Relative: 0.5 % (ref 0.0–3.0)
Eosinophils Absolute: 0.2 10*3/uL (ref 0.0–0.7)
Eosinophils Relative: 4 % (ref 0.0–5.0)
HCT: 43 % (ref 36.0–46.0)
Hemoglobin: 14.2 g/dL (ref 12.0–15.0)
Lymphocytes Relative: 34.6 % (ref 12.0–46.0)
Lymphs Abs: 1.6 10*3/uL (ref 0.7–4.0)
MCHC: 33.1 g/dL (ref 30.0–36.0)
MCV: 73.3 fl — ABNORMAL LOW (ref 78.0–100.0)
Monocytes Absolute: 0.4 10*3/uL (ref 0.1–1.0)
Monocytes Relative: 8.5 % (ref 3.0–12.0)
Neutro Abs: 2.4 10*3/uL (ref 1.4–7.7)
Neutrophils Relative %: 52.4 % (ref 43.0–77.0)
Platelets: 181 10*3/uL (ref 150.0–400.0)
RBC: 5.86 Mil/uL — ABNORMAL HIGH (ref 3.87–5.11)
RDW: 17.4 % — ABNORMAL HIGH (ref 11.5–15.5)
WBC: 4.5 10*3/uL (ref 4.0–10.5)

## 2023-04-14 LAB — LIPID PANEL
Cholesterol: 165 mg/dL (ref 0–200)
HDL: 41.9 mg/dL (ref >39.00)
LDL Cholesterol: 99 mg/dL (ref 0–99)
NonHDL: 122.6
Total CHOL/HDL Ratio: 4
Triglycerides: 116 mg/dL (ref 0.0–149.0)
VLDL: 23.2 mg/dL (ref 0.0–40.0)

## 2023-04-14 LAB — HEMOGLOBIN A1C: Hgb A1c MFr Bld: 6.3 % (ref 4.6–6.5)

## 2023-04-14 LAB — VITAMIN D 25 HYDROXY (VIT D DEFICIENCY, FRACTURES): VITD: 22.52 ng/mL — ABNORMAL LOW (ref 30.00–100.00)

## 2023-04-14 NOTE — Patient Instructions (Addendum)
 Go to lab Maintain Heart healthy diet and daily exercise. Maintain current medications. Schedule appointment for annual mammogram.  Mediterranean Diet A Mediterranean diet is based on the traditions of countries on the [employer]. It focuses on eating more: Fruits and vegetables. Whole grains, beans, nuts, and seeds. Heart-healthy fats. These are fats that are good for your heart. It involves eating less: Dairy. Meat and eggs. Processed foods with added sugar, salt, and fat. This type of diet can help prevent certain conditions. It can also improve outcomes if you have a long-term (chronic) disease, such as kidney or heart disease. What are tips for following this plan? Reading food labels Check packaged foods for: The serving size. For foods such as rice and pasta, the serving size is the amount of cooked product, not dry. The total fat. Avoid foods with saturated fat or trans fat. Added sugars, such as corn syrup. Shopping  Try to have a balanced diet. Buy a variety of foods, such as: Fresh fruits and vegetables. You may be able to get these from local farmers markets. You can also buy them frozen. Grains, beans, nuts, and seeds. Some of these can be bought in bulk. Fresh seafood. Poultry and eggs. Low-fat dairy products. Buy whole ingredients instead of foods that have already been packaged. If you can't get fresh seafood, buy precooked frozen shrimp or canned fish, such as tuna, salmon, or sardines. Stock your pantry so you always have certain foods on hand, such as olive oil, canned tuna, canned tomatoes, rice, pasta, and beans. Cooking Cook foods with extra-virgin olive oil instead of using butter or other vegetable oils. Have meat as a side dish. Have vegetables or grains as your main dish. This means having meat in small portions or adding small amounts of meat to foods like pasta or stew. Use beans or vegetables instead of meat in common dishes like chili or  lasagna. Try out different cooking methods. Try roasting, broiling, steaming, and sauting vegetables. Add frozen vegetables to soups, stews, pasta, or rice. Add nuts or seeds for added healthy fats and plant protein at each meal. You can add these to yogurt, salads, or vegetable dishes. Marinate fish or vegetables using olive oil, lemon juice, garlic, and fresh herbs. Meal planning Plan to eat a vegetarian meal one day each week. Try to work up to two vegetarian meals, if possible. Eat seafood two or more times a week. Have healthy snacks on hand. These may include: Vegetable sticks with hummus. Greek yogurt. Fruit and nut trail mix. Eat balanced meals. These should include: Fruit: 2-3 servings a day. Vegetables: 4-5 servings a day. Low-fat dairy: 2 servings a day. Fish, poultry, or lean meat: 1 serving a day. Beans and legumes: 2 or more servings a week. Nuts and seeds: 1-2 servings a day. Whole grains: 6-8 servings a day. Extra-virgin olive oil: 3-4 servings a day. Limit red meat and sweets to just a few servings a month. Lifestyle  Try to cook and eat meals with your family. Drink enough fluid to keep your pee (urine) pale yellow. Be active every day. This includes: Aerobic exercise, which is exercise that causes your heart to beat faster. Examples include running and swimming. Leisure activities like gardening, walking, or housework. Get 7-8 hours of sleep each night. Drink red wine if your provider says you can. A glass of wine is 5 oz (150 mL). You may be allowed to have: Up to 1 glass a day if you're female and not  pregnant. Up to 2 glasses a day if you're female. What foods should I eat? Fruits Apples. Apricots. Avocado. Berries. Bananas. Cherries. Dates. Figs. Grapes. Lemons. Melon. Oranges. Peaches. Plums. Pomegranate. Vegetables Artichokes. Beets. Broccoli. Cabbage. Carrots. Eggplant. Green beans. Chard. Kale. Spinach. Onions. Leeks. Peas. Squash. Tomatoes. Peppers.  Radishes. Grains Whole-grain pasta. Brown rice. Bulgur wheat. Polenta. Couscous. Whole-wheat bread. Orpah Cobb. Meats and other proteins Beans. Almonds. Sunflower seeds. Pine nuts. Peanuts. Cod. Salmon. Scallops. Shrimp. Tuna. Tilapia. Clams. Oysters. Eggs. Chicken or [place] without skin. Dairy Low-fat milk. Cheese. Greek yogurt. Fats and oils Extra-virgin olive oil. Avocado oil. Grapeseed oil. Beverages Water. Red wine. Herbal tea. Sweets and desserts Greek yogurt with honey. Baked apples. Poached pears. Trail mix. Seasonings and condiments Basil. Cilantro. Coriander. Cumin. Mint. Parsley. Sage. Rosemary. Tarragon. Garlic. Oregano. Thyme. Pepper. Balsamic vinegar. Tahini. Hummus. Tomato sauce. Olives. Mushrooms. The items listed above may not be all the foods and drinks you can have. Talk to a dietitian to learn more. What foods should I limit? This is a list of foods that should be eaten rarely. Fruits Fruit canned in syrup. Vegetables Deep-fried potatoes, like [place] fries. Grains Packaged pasta or rice dishes. Cereal with added sugar. Snacks with added sugar. Meats and other proteins Beef. Pork. Lamb. Chicken or [place] with skin. Hot dogs. Tomasa Blase. Dairy Ice cream. Sour cream. Whole milk. Fats and oils Butter. Canola oil. Vegetable oil. Beef fat (tallow). Lard. Beverages Juice. Sugar-sweetened soft drinks. Beer. Liquor and spirits. Sweets and desserts Cookies. Cakes. Pies. Candy. Seasonings and condiments Mayonnaise. Pre-made sauces and marinades. The items listed above may not be all the foods and drinks you should limit. Talk to a dietitian to learn more. Where to find more information American Heart Association (AHA): heart.org This information is not intended to replace advice given to you by your health care provider. Make sure you discuss any questions you have with your health care provider. Document Revised: 05/05/2022 Document Reviewed: 05/05/2022 Elsevier  Patient Education  2024 [employer].

## 2023-04-14 NOTE — Progress Notes (Signed)
 Complete physical exam  Patient: Susan Morrison   DOB: May 21, 1970   53 y.o. Female  MRN: [id number] Visit Date: 04/14/2023  Subjective:    Chief Complaint  Patient presents with   Annual Exam    Would like flu vaccine.    Susan Morrison is a 53 y.o. female who presents today for a complete physical exam. She reports consuming a low fat diet.  Walking EOD  She generally feels well. She reports sleeping well. She does not have additional problems to discuss today.  Vision:Yes Dental:Within Last 6 months STD Screen:No  BP Readings from Last 3 Encounters:  04/14/23 112/80  09/16/22 110/80  07/08/22 134/71   Wt Readings from Last 3 Encounters:  04/14/23 255 lb 6.4 oz (115.8 kg)  09/16/22 251 lb 9.6 oz (114.1 kg)  07/08/22 259 lb (117.5 kg)   Most recent fall risk assessment:    04/14/2023    8:25 AM  Fall Risk   Falls in the past year? 0  Number falls in past yr: 0  Injury with Fall? 0  Risk for fall due to : No Fall Risks  Follow up Falls evaluation completed     Depression screen:Yes - No Depression Most recent depression screenings:    04/14/2023    8:25 AM 04/10/2022    1:48 PM  PHQ 2/9 Scores  PHQ - 2 Score 0 1  PHQ- 9 Score 1 12    HPI  No problem-specific Assessment & Plan notes found for this encounter.   Past Medical History:  Diagnosis Date   Anemia    Arthritis    Breast cancer (HCC) 2018   LEFT   Breast mass 03/21/2016   left 7:00   Depression 10/06/2021   Genetic testing 06/03/2016   Ms. Heidel underwent genetic counseling and testing for hereditary cancer syndromes on 05/15/2016. Her results were negative for mutations in all 46 genes analyzed by [facility]'s 46-gene Common Hereditary Cancers Panel. Genes analyzed include: APC, ATM, AXIN2, BARD1, BMPR1A, BRCA1, BRCA2, BRIP1, CDH1, CDKN2A, CHEK2, CTNNA1, DICER1, EPCAM, GREM1, HOXB13, KIT, MEN1, MLH1, MSH2, MSH3, MSH6, MUTYH, NBN,   History of blood transfusion 2019   due to breast cancer and  needing transfusion   History of kidney stones    History of radiation therapy 12/04/16-01/14/17   left breast 50.4 Gy in 28 fractions   OSA (obstructive sleep apnea) 08/12/2021   Personal history of chemotherapy    March-June 2018   Personal history of radiation therapy    Oct-Dec 2018   PONV (postoperative nausea and vomiting)    Past Surgical History:  Procedure Laterality Date   ABDOMINAL HYSTERECTOMY  03/05/2018   BREAST LUMPECTOMY Left 2018   CYSTOSCOPY N/A 03/05/2018   Procedure: CYSTOSCOPY;  Surgeon: Edwinna Areola, DO;  Location: [facility];  Service: Gynecology;  Laterality: N/A;   DILATION AND CURETTAGE OF UTERUS     last done 2years ago   PORT-A-CATH REMOVAL  12/2016   PORTACATH PLACEMENT N/A 04/15/2016   Procedure: INSERTION PORT-A-CATH WITH [place];  Surgeon: Ovidio Kin, MD;  Location: [facility];  Service: General;  Laterality: N/A;   RADIOACTIVE SEED GUIDED AXILLARY SENTINEL LYMPH NODE Left 10/24/2016   Procedure: RADIOACTIVE SEED GUIDED AXILLARY TARGETED SENTINEL LYMPH NODE DISSECTION;  Surgeon: Ovidio Kin, MD;  Location:  SURGERY CENTER;  Service: General;  Laterality: Left;   RADIOACTIVE SEED GUIDED EXCISIONAL BREAST BIOPSY Bilateral 10/24/2016   Procedure: RIGHT BREAST RADIOACTIVE SEED GUIDED EXCISIONAL BREAST  BIOPSIES X'S 2, LEFT BREAST RADIOACTIVE SEED GUIDED EXCISIONAL BIOPSY X2;  Surgeon: Ovidio Kin, MD;  Location: [facility];  Service: General;  Laterality: Bilateral;   ROBOT ASSISTED PYELOPLASTY Left 03/05/2017   Procedure: XI ROBOTIC ASSISTED PYELOPLASTY;  Surgeon: Crist Fat, MD;  Location: [facility];  Service: Urology;  Laterality: Left;   SMALL INTESTINE SURGERY     pt denies having had small intestine surg   TOTAL LAPAROSCOPIC HYSTERECTOMY WITH SALPINGECTOMY Bilateral 03/05/2018   Procedure: TOTAL LAPAROSCOPIC HYSTERECTOMY WITH SALPINGECTOMY, poss open;  Surgeon: Edwinna Areola, DO;  Location:  [facility];  Service: Gynecology;  Laterality: Bilateral;  Poss Open Hysterectomy   TUBAL LIGATION  1996   Social History   Socioeconomic History   Marital status: Married    Spouse name: Not on file   Number of children: 3   Years of education: 14   Highest education level: Not on file  Occupational History   Occupation: Event organiser: [employer]  Tobacco Use   Smoking status: Never   Smokeless tobacco: Never  Vaping Use   Vaping status: Never Used  Substance and Sexual Activity   Alcohol use: Yes    Comment: wine/social   Drug use: No   Sexual activity: Yes    Birth control/protection: Surgical    Comment: tubal ligation and hysterectomy  Other Topics Concern   Not on file  Social History Narrative   Lives with fiancee in a one story home.     Works as a Production designer, theatre/television/film for [employer].     Education: 2 years of college.    Social Drivers of Health   Financial Resource Strain: Patient Declined (09/15/2022)   Overall Financial Resource Strain (CARDIA)    Difficulty of Paying Living Expenses: Patient declined  Food Insecurity: Patient Declined (09/15/2022)   Hunger Vital Sign    Worried About Running Out of Food in the Last Year: Patient declined    Ran Out of Food in the Last Year: Patient declined  Transportation Needs: Patient Declined (09/15/2022)   PRAPARE - Administrator, Civil Service (Medical): Patient declined    Lack of Transportation (Non-Medical): Patient declined  Physical Activity: Unknown (09/15/2022)   Exercise Vital Sign    Days of Exercise per Week: 0 days    Minutes of Exercise per Session: Not on file  Stress: No Stress Concern Present (09/15/2022)   [employer] of Occupational Health - Occupational Stress Questionnaire    Feeling of Stress : Not at all  Social Connections: Unknown (09/15/2022)   Social Connection and Isolation Panel [NHANES]    Frequency of Communication with Friends and Family: Patient declined     Frequency of Social Gatherings with Friends and Family: Patient declined    Attends Religious Services: Patient declined    Database administrator or Organizations: Yes    Attends Banker Meetings: Patient declined    Marital Status: Married  Catering manager Violence: Not on file   Family Status  Relation Name Status   Mother Hoffman Alive   Father  Deceased   MGM Leona Deceased   MGF  Deceased   PGM  Deceased   PGF  Deceased  No partnership data on file   Family History  Problem Relation Age of Onset   Hypertension Mother    Diabetes Mother    Sleep apnea Mother    Other Father  Homicide   Arthritis Maternal Grandmother    Cancer Maternal Grandmother    Breast cancer Paternal Grandmother 42       d.62 bilateral breast cancer   Allergies  Allergen Reactions   Ibuprofen Other (See Comments)    Kidney issues     Patient Care Team: Jency Schnieders, Bonna Gains, NP as PCP - General (Internal Medicine) Ovidio Kin, MD as Consulting Physician (General Surgery) Serena Croissant, MD as Consulting Physician (Hematology and Oncology) Lonie Peak, MD as Attending Physician (Radiation Oncology) Loa Socks, NP as Nurse Practitioner (Hematology and Oncology)   Medications: Outpatient Medications Prior to Visit  Medication Sig   acetaminophen (TYLENOL 8 HOUR) 650 MG CR tablet Take 1 tablet (650 mg total) by mouth every 8 (eight) hours as needed for pain.   cyclobenzaprine (FLEXERIL) 5 MG tablet Take 1-2 tablets (5-10 mg total) by mouth at bedtime.   Multiple Vitamins-Minerals (MULTIVITAMIN WITH MINERALS) tablet Take 1 tablet by mouth daily. (Patient not taking: Reported on 04/14/2023)   [DISCONTINUED] predniSONE (DELTASONE) 20 MG tablet Take 2 tablets (40 mg total) by mouth daily with breakfast.   No facility-administered medications prior to visit.    Review of Systems  Constitutional:  Negative for activity change, appetite change and unexpected  weight change.  Respiratory: Negative.    Cardiovascular: Negative.   Gastrointestinal: Negative.   Endocrine: Negative for cold intolerance and heat intolerance.  Genitourinary: Negative.   Musculoskeletal: Negative.   Skin: Negative.   Neurological: Negative.   Hematological: Negative.   Psychiatric/Behavioral:  Negative for behavioral problems, decreased concentration, dysphoric mood, hallucinations, self-injury, sleep disturbance and suicidal ideas. The patient is not nervous/anxious.         Objective:  BP 112/80   Pulse 60   Temp 98 F (36.7 C) (Temporal)   Ht 5' 6.4" (1.687 m)   Wt 255 lb 6.4 oz (115.8 kg)   LMP 11/11/2017 (Approximate)   SpO2 96%   BMI 40.73 kg/m     Physical Exam Vitals and nursing note reviewed.  Constitutional:      General: She is not in acute distress. HENT:     Right Ear: Tympanic membrane, ear canal and external ear normal.     Left Ear: Tympanic membrane, ear canal and external ear normal.     Nose: Nose normal.  Eyes:     Extraocular Movements: Extraocular movements intact.     Conjunctiva/sclera: Conjunctivae normal.     Pupils: Pupils are equal, round, and reactive to light.  Neck:     Thyroid: No thyroid mass, thyromegaly or thyroid tenderness.  Cardiovascular:     Rate and Rhythm: Normal rate and regular rhythm.     Pulses: Normal pulses.     Heart sounds: Normal heart sounds.  Pulmonary:     Effort: Pulmonary effort is normal.     Breath sounds: Normal breath sounds.  Abdominal:     General: Bowel sounds are normal.     Palpations: Abdomen is soft.  Musculoskeletal:        General: Normal range of motion.     Cervical back: Normal range of motion and neck supple.     Right lower leg: No edema.     Left lower leg: No edema.  Lymphadenopathy:     Cervical: No cervical adenopathy.  Skin:    General: Skin is warm and dry.  Neurological:     Mental Status: She is alert and oriented to person, place, and time.  Cranial  Nerves: No cranial nerve deficit.  Psychiatric:        Mood and Affect: Mood normal.        Behavior: Behavior normal.        Thought Content: Thought content normal.      No results found for any visits on 04/14/23.    Assessment & Plan:    Routine Health Maintenance and Physical Exam  Immunization History  Administered Date(s) Administered   Influenza, Seasonal, Injecte, Preservative Fre 04/14/2023   Influenza,inj,Quad PF,6+ Mos 03/05/2016, 11/07/2020, 11/06/2021   Influenza-Unspecified 10/01/2016, 09/14/2017, 10/01/2018   PFIZER(Purple Top)SARS-COV-2 Vaccination 05/13/2019, 06/07/2019   PNEUMOCOCCAL CONJUGATE-20 02/08/2021   Tdap 11/07/2020   Zoster Recombinant(Shingrix) 04/10/2022, 04/14/2023    Health Maintenance  Topic Date Due   COVID-19 Vaccine (3 - Pfizer risk series) 04/30/2023 (Originally 07/05/2019)   Hepatitis C Screening  04/13/2024 (Originally 09[birth year]/1989)   MAMMOGRAM  05/02/2024   DTaP/Tdap/Td (2 - Td or Tdap) 11/08/2030   Colonoscopy  02/13/2031   INFLUENZA VACCINE  Completed   HIV Screening  Completed   Zoster Vaccines- Shingrix  Completed   Pneumococcal Vaccine 32-44 Years old  Aged Out   HPV VACCINES  Aged Out    Discussed health benefits of physical activity, and encouraged her to engage in regular exercise appropriate for her age and condition.  Problem List Items Addressed This Visit     Elevated LDL cholesterol level   Relevant Orders   Lipid panel   Hyperglycemia   Relevant Orders   Hemoglobin A1c   Iron deficiency anemia due to chronic blood loss   Relevant Orders   IBC + Ferritin   CBC with Differential/Platelet   Vitamin D deficiency   Relevant Orders   VITAMIN D 25 Hydroxy (Vit-D Deficiency, Fractures)   Other Visit Diagnoses       Preventative health care    -  Primary   Relevant Orders   Comprehensive metabolic panel     Encounter for hepatitis C screening test for low risk patient       Relevant Orders   Hepatitis C antibody      Immunization due       Relevant Orders   Flu vaccine trivalent PF, 6mos and older(Flulaval,Afluria,Fluarix,Fluzone) (Completed)   Zoster Recombinant (Shingrix ) (Completed)      Return in about 1 year (around 04/13/2024) for CPE (fasting).     Alysia Penna, NP

## 2023-04-15 LAB — HEPATITIS C ANTIBODY: Hepatitis C Ab: NONREACTIVE

## 2023-04-16 ENCOUNTER — Encounter: Payer: Self-pay | Admitting: Nurse Practitioner

## 2023-04-16 NOTE — Addendum Note (Signed)
 Addended by: Alysia Penna L on: 04/16/2023 04:09 PM   Modules accepted: Orders

## 2023-05-05 ENCOUNTER — Ambulatory Visit
Admission: RE | Admit: 2023-05-05 | Discharge: 2023-05-05 | Disposition: A | Discharge status: Home / Self Care | Source: Ambulatory Visit | Attending: Hematology and Oncology | Admitting: Hematology and Oncology

## 2023-05-05 ENCOUNTER — Encounter: Payer: Self-pay | Admitting: Hematology and Oncology

## 2023-05-05 DIAGNOSIS — Z1231 Encounter for screening mammogram for malignant neoplasm of breast: Secondary | ICD-10-CM

## 2023-07-08 ENCOUNTER — Inpatient Hospital Stay: Payer: 59 | Attending: Hematology and Oncology | Admitting: Hematology and Oncology

## 2023-07-08 VITALS — BP 144/72 | HR 68 | Temp 98.2°F | Resp 20 | Wt 258.6 lb

## 2023-07-08 DIAGNOSIS — R200 Anesthesia of skin: Secondary | ICD-10-CM | POA: Insufficient documentation

## 2023-07-08 DIAGNOSIS — G620 Drug-induced polyneuropathy: Secondary | ICD-10-CM | POA: Insufficient documentation

## 2023-07-08 DIAGNOSIS — Z853 Personal history of malignant neoplasm of breast: Secondary | ICD-10-CM | POA: Insufficient documentation

## 2023-07-08 DIAGNOSIS — Z171 Estrogen receptor negative status [ER-]: Secondary | ICD-10-CM

## 2023-07-08 DIAGNOSIS — T451X5A Adverse effect of antineoplastic and immunosuppressive drugs, initial encounter: Secondary | ICD-10-CM | POA: Insufficient documentation

## 2023-07-08 DIAGNOSIS — Z923 Personal history of irradiation: Secondary | ICD-10-CM | POA: Insufficient documentation

## 2023-07-08 DIAGNOSIS — M25579 Pain in unspecified ankle and joints of unspecified foot: Secondary | ICD-10-CM | POA: Insufficient documentation

## 2023-07-08 DIAGNOSIS — C50312 Malignant neoplasm of lower-inner quadrant of left female breast: Secondary | ICD-10-CM

## 2023-07-08 NOTE — Assessment & Plan Note (Signed)
 1. 03/22/2016: Left breast biopsy 8:00: IDC high-grade with necrosis, left axillary lymph node biopsy IDC, ER 0%, PR 0%, HER-2 negative ratio 1.42, Ki-67 90%; palpable lump 3.5 x 2.6 x 2.2 cm and 2.8 Center left axillary lymph node, T2 N1 stage IIB 2. Neoadjuvant chemotherapy with Adriamycin  and Cytoxan  dose dense 4 followed by Taxol  weekly 12  completed 08/30/2016 2. 10/24/2016 Right lumpectomy: No malignancy identified. Complete pathologic response. 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 Adjuvant radiation therapy 12/04/2016-01/14/2017 -------------------------------------------------------------------------------------------------------------------------------------------------------  Chemotherapy-induced neuropathy: much improved, no longer taking gabapentin  or Cymbalta . Ankle pain: I discussed with her the importance of losing weight and exercising.   Breast cancer surveillance: 1.  Breast exam 07/08/2023: Benign 2.  Mammogram 05/06/2023: Benign, breast density category C   Return to clinic in 1 year for follow-up

## 2023-07-08 NOTE — Progress Notes (Signed)
 Patient Care Team: Nche, Connye Delaine, NP as PCP - General (Internal Medicine) Juanita Norlander, MD as Consulting Physician (General Surgery) Cameron Cea, MD as Consulting Physician (Hematology and Oncology) Colie Dawes, MD as Attending Physician (Radiation Oncology) Percival Brace, NP as Nurse Practitioner (Hematology and Oncology)  DIAGNOSIS:  Encounter Diagnosis  Name Primary?   Malignant neoplasm of lower-inner quadrant of left breast in female, estrogen receptor negative (HCC) Yes    SUMMARY OF ONCOLOGIC HISTORY: Oncology History  Malignant neoplasm of lower-inner quadrant of left breast in female, estrogen receptor negative (HCC)  03/22/2016 Initial Diagnosis   Left breast biopsy 8:00: IDC high-grade with necrosis, left axillary lymph node biopsy IDC, ER 0%, PR 0%, HER-2 negative ratio 1.42, Ki-67 90%; palpable lump 3.5 x 2.6 x 2.2 cm and 2.8 Center left axillary lymph node, T2 N1 stage IIB   04/10/2016 Initial Biopsy   Additional biopsies left breast 9:30: Sclerosed intraductal papilloma with every 3 hours, right breast 1:00: Complex sclerosing lesion with UDH, right breast 8:00: Complex sclerosing lesion with UDH   04/17/2016 - 08/28/2016 Neo-Adjuvant Chemotherapy   Dose dense Adriamycin  and Cytoxan  4 followed by Taxol  weekly 12   05/15/2016 Genetic Testing   Genetic counseling and testing for hereditary cancer syndromes performed on 05/15/2016. Results are negative for pathogenic mutations in 46 genes analyzed by [facility]'s Common Hereditary Cancers Panel. Results are dated 05/24/2016. Genes tested: APC, ATM, AXIN2, BARD1, BMPR1A, BRCA1, BRCA2, BRIP1, CDH1, CDKN2A, CHEK2, CTNNA1, DICER1, EPCAM, GREM1, HOXB13, KIT, MEN1, MLH1, MSH2, MSH3, MSH6, MUTYH, NBN, NF1, NTHL1, PALB2, PDGFRA, PMS2, POLD1, POLE, PTEN, RAD50, RAD51C, RAD51D, SDHA, SDHB, SDHC, SDHD, SMAD4, SMARCA4, STK11, TP53, TSC1, TSC2, and VHL.  A variant of uncertain significance (not clinically actionable) was  noted in MUTYH.      10/24/2016 Surgery   Right lumpectomy: No malignancy identified. Complete pathologic response. 0/4 lymph nodes negative, previously triple negative disease with a Ki-67 of 90%   12/04/2016 - 01/14/2017 Radiation Therapy   Adjuvant radiation therapy     CHIEF COMPLIANT: Surveillance of breast cancer  HISTORY OF PRESENT ILLNESS:  History of Present Illness Susan Morrison is a 53 year old female with a history of breast cancer who presents for a follow-up visit. Her husband, who is noted to be a 'vitamin person', accompanied her to the visit.  She is seven years post-diagnosis of estrogen receptor-negative breast cancer in the lower-inner quadrant of the left breast. Her last mammogram in April was normal.  She experiences occasional numbness in her hand and arm, particularly after extensive use, suggesting mild residual chemotherapy-induced neuropathy.     ALLERGIES:  is allergic to ibuprofen.  MEDICATIONS:  Current Outpatient Medications  Medication Sig Dispense Refill   acetaminophen  (TYLENOL  8 HOUR) 650 MG CR tablet Take 1 tablet (650 mg total) by mouth every 8 (eight) hours as needed for pain.     No current facility-administered medications for this visit.    PHYSICAL EXAMINATION: ECOG PERFORMANCE STATUS: 1 - Symptomatic but completely ambulatory  Vitals:   07/08/23 1436  BP: (!) 144/72  Pulse: 68  Resp: 20  Temp: 98.2 F (36.8 C)  SpO2: 98%   Filed Weights   07/08/23 1436  Weight: 258 lb 9.6 oz (117.3 kg)    Physical Exam   (exam performed in the presence of a chaperone)  LABORATORY DATA:  I have reviewed the data as listed    Latest Ref Rng & Units 04/14/2023    8:50 AM 04/10/2022  1:59 PM 02/08/2021    9:09 AM  CMP  Glucose 70 - 99 mg/dL 94  78  93   BUN 6 - 23 mg/dL 13  14  16    Creatinine 0.40 - 1.20 mg/dL 4.25  9.56  3.87   Sodium 135 - 145 mEq/L 140  138  138   Potassium 3.5 - 5.1 mEq/L 3.8  3.8  4.0   Chloride 96 - 112  mEq/L 105  103  104   CO2 19 - 32 mEq/L 27  28  27    Calcium 8.4 - 10.5 mg/dL 9.0  9.5  9.0   Total Protein 6.0 - 8.3 g/dL 6.9  7.2  7.1   Total Bilirubin 0.2 - 1.2 mg/dL 0.9  1.0  1.0   Alkaline Phos 39 - 117 U/L 61  57  53   AST 0 - 37 U/L 43  44  45   ALT 0 - 35 U/L 62  60  59     Lab Results  Component Value Date   WBC 4.5 04/14/2023   HGB 14.2 04/14/2023   HCT 43.0 04/14/2023   MCV 73.3 (L) 04/14/2023   PLT 181.0 04/14/2023   NEUTROABS 2.4 04/14/2023    ASSESSMENT & PLAN:  Malignant neoplasm of lower-inner quadrant of left breast in female, estrogen receptor negative (HCC) 1. 03/22/2016: Left breast biopsy 8:00: IDC high-grade with necrosis, left axillary lymph node biopsy IDC, ER 0%, PR 0%, HER-2 negative ratio 1.42, Ki-67 90%; palpable lump 3.5 x 2.6 x 2.2 cm and 2.8 Center left axillary lymph node, T2 N1 stage IIB 2. Neoadjuvant chemotherapy with Adriamycin  and Cytoxan  dose dense 4 followed by Taxol  weekly 12  completed 08/30/2016 2. 10/24/2016 Right lumpectomy: No malignancy identified. Complete pathologic response. 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 Adjuvant radiation therapy 12/04/2016-01/14/2017 -------------------------------------------------------------------------------------------------------------------------------------------------------  Chemotherapy-induced neuropathy: much improved, no longer taking gabapentin  or Cymbalta . Ankle pain: I discussed with her the importance of losing weight and exercising.   Breast cancer surveillance: 1.  Breast exam 07/08/2023: Benign 2.  Mammogram 05/06/2023: Benign, breast density category C 3.  Guardant reveal for MRD testing   Return to clinic in 1 year for follow-up ------------------------------------- Assessment and Plan Assessment & Plan Malignant neoplasm of left breast Seven years post-diagnosis with no recurrence on recent mammogram.  Chemotherapy-induced  neuropathy Intermittent numbness in arm and hand, improved over time, no current medication. - Monitor symptoms. - Consider medication if symptoms worsen.      No orders of the defined types were placed in this encounter.  The patient has a good understanding of the overall plan. she agrees with it. she will call with any problems that may develop before the next visit here. Total time spent: 30 mins including face to face time and time spent for planning, charting and co-ordination of care   Viinay K Ingeborg Fite, MD 07/08/23

## 2023-07-11 ENCOUNTER — Telehealth: Payer: Self-pay

## 2023-07-11 NOTE — Telephone Encounter (Signed)
 Per md orders entered for Guardant Reveal and all supported documents faxed to [phone]. Faxed confirmation was received.

## 2023-07-22 ENCOUNTER — Encounter: Payer: Self-pay | Admitting: Hematology and Oncology

## 2023-08-06 ENCOUNTER — Encounter: Payer: Self-pay | Admitting: Hematology and Oncology

## 2023-10-22 ENCOUNTER — Ambulatory Visit: Admitting: Nurse Practitioner

## 2023-10-22 ENCOUNTER — Encounter: Payer: Self-pay | Admitting: Nurse Practitioner

## 2023-10-22 VITALS — BP 132/80 | HR 66 | Temp 97.8°F | Ht 66.0 in | Wt 259.8 lb

## 2023-10-22 DIAGNOSIS — R030 Elevated blood-pressure reading, without diagnosis of hypertension: Secondary | ICD-10-CM | POA: Insufficient documentation

## 2023-10-22 DIAGNOSIS — R7303 Prediabetes: Secondary | ICD-10-CM | POA: Diagnosis not present

## 2023-10-22 LAB — POCT GLYCOSYLATED HEMOGLOBIN (HGB A1C): Hemoglobin A1C: 5.9 % — AB (ref 4.0–5.6)

## 2023-10-22 NOTE — Progress Notes (Signed)
 Patient's vitals from 10/22/23 while at work BP-151/101, Wt-259.8 lbs, Ht-5'6,Temp.-97.3

## 2023-10-22 NOTE — Progress Notes (Signed)
                Established Patient Visit  Patient: Susan Morrison   DOB: January 01, 1971   53 y.o. Female  MRN: [id number] Visit Date: 10/22/2023  Subjective:    Chief Complaint  Patient presents with   Hypertension    Told at work BP was high to follow up with PCP   Hypertension   Prediabetes Repeat hgbA1c at 5.9% improved from 6.3%  Elevated BP without diagnosis of hypertension Elevated BP noted during oncology appointment and health screen at work (150/101 with machine). Asymptomatic. Repeat BP check in right arm in office today: 150/90 BP Readings from Last 3 Encounters:  10/22/23 132/80  07/08/23 (!) 144/72  04/14/23 112/80    Advised to maintain Maintain a low salt and high potassium diet. Start daily exercise-walking 20-30mins daily Monitor BP daily in Am bring BP machine and BP reading to next appointment. F/up in 2weeks  Reviewed medical, surgical, and social history today  Medications: Outpatient Medications Prior to Visit  Medication Sig   acetaminophen  (TYLENOL  8 HOUR) 650 MG CR tablet Take 1 tablet (650 mg total) by mouth every 8 (eight) hours as needed for pain.   No facility-administered medications prior to visit.   Reviewed past medical and social history.   ROS per HPI above      Objective:  BP 132/80   Pulse 66   Temp 97.8 F (36.6 C) (Temporal)   Ht 5' 6 (1.676 m)   Wt 259 lb 12.8 oz (117.8 kg)   LMP 11/11/2017 (Approximate)   SpO2 97%   BMI 41.93 kg/m      Physical Exam Vitals and nursing note reviewed.  Constitutional:      Appearance: She is obese.  Cardiovascular:     Rate and Rhythm: Normal rate and regular rhythm.     Pulses: Normal pulses.  Pulmonary:     Effort: Pulmonary effort is normal.  Musculoskeletal:     Right lower leg: No edema.     Left lower leg: No edema.  Neurological:     Mental Status: She is alert and oriented to person, place, and time.     Results for orders placed or performed in visit on  10/22/23  POCT glycosylated hemoglobin (Hb A1C)  Result Value Ref Range   Hemoglobin A1C 5.9 (A) 4.0 - 5.6 %   HbA1c POC (<> result, manual entry)     HbA1c, POC (prediabetic range)     HbA1c, POC (controlled diabetic range)        Assessment & Plan:    Problem List Items Addressed This Visit     Elevated BP without diagnosis of hypertension - Primary   Elevated BP noted during oncology appointment and health screen at work (150/101 with machine). Asymptomatic. Repeat BP check in right arm in office today: 150/90 BP Readings from Last 3 Encounters:  10/22/23 132/80  07/08/23 (!) 144/72  04/14/23 112/80    Advised to maintain Maintain a low salt and high potassium diet. Start daily exercise-walking 20-30mins daily Monitor BP daily in Am bring BP machine and BP reading to next appointment. F/up in 2weeks      Prediabetes   Repeat hgbA1c at 5.9% improved from 6.3%      Relevant Orders   POCT glycosylated hemoglobin (Hb A1C) (Completed)   Return in about 2 weeks (around 11/05/2023) for HTN.     Roselie Mood, NP

## 2023-10-22 NOTE — Assessment & Plan Note (Addendum)
 Elevated BP noted during oncology appointment and health screen at work (150/101 with machine). Asymptomatic. Repeat BP check in right arm in office today: 150/90 BP Readings from Last 3 Encounters:  10/22/23 132/80  07/08/23 (!) 144/72  04/14/23 112/80    Advised to maintain Maintain a low salt and high potassium diet. Start daily exercise-walking 20-30mins daily Monitor BP daily in Am bring BP machine and BP reading to next appointment. F/up in 2weeks

## 2023-10-22 NOTE — Assessment & Plan Note (Signed)
 Repeat hgbA1c at 5.9% improved from 6.3%

## 2023-10-22 NOTE — Patient Instructions (Addendum)
 hgbA1c at 5.9% improved from 6.3% Maintain a low salt and high potassium diet. Start daily exercise-walking 20-30mins daily Monitor BP daily in [facility] BP machine and BP reading to next appointment.  DASH Eating Plan DASH stands for Dietary Approaches to Stop Hypertension. The DASH eating plan is a healthy eating plan that has been shown to: Lower high blood pressure (hypertension). Reduce your risk for type 2 diabetes, heart disease, and stroke. Help with weight loss. What are tips for following this plan? Reading food labels Check food labels for the amount of salt (sodium) per serving. Choose foods with less than 5 percent of the Daily Value (DV) of sodium. In general, foods with less than 300 milligrams (mg) of sodium per serving fit into this eating plan. To find whole grains, look for the word whole as the first word in the ingredient list. Shopping Buy products labeled as low-sodium or no salt added. Buy fresh foods. Avoid canned foods and pre-made or frozen meals. Cooking Try not to add salt when you cook. Use salt-free seasonings or herbs instead of table salt or sea salt. Check with your health care provider or pharmacist before using salt substitutes. Do not fry foods. Cook foods in healthy ways, such as baking, boiling, grilling, roasting, or broiling. Cook using oils that are good for your heart. These include olive, canola, avocado, soybean, and sunflower oil. Meal planning  Eat a balanced diet. This should include: 4 or more servings of fruits and 4 or more servings of vegetables each day. Try to fill half of your plate with fruits and vegetables. 6-8 servings of whole grains each day. 6 or less servings of lean meat, poultry, or fish each day. 1 oz is 1 serving. A 3 oz (85 g) serving of meat is about the same size as the palm of your hand. One egg is 1 oz (28 g). 2-3 servings of low-fat dairy each day. One serving is 1 cup (237 mL). 1 serving of nuts, seeds, or  beans 5 times each week. 2-3 servings of heart-healthy fats. Healthy fats called omega-3 fatty acids are found in foods such as walnuts, flaxseeds, fortified milks, and eggs. These fats are also found in cold-water fish, such as sardines, salmon, and mackerel. Limit how much you eat of: Canned or prepackaged foods. Food that is high in trans fat, such as fried foods. Food that is high in saturated fat, such as fatty meat. Desserts and other sweets, sugary drinks, and other foods with added sugar. Full-fat dairy products. Do not salt foods before eating. Do not eat more than 4 egg yolks a week. Try to eat at least 2 vegetarian meals a week. Eat more home-cooked food and less restaurant, buffet, and fast food. Lifestyle When eating at a restaurant, ask if your food can be made with less salt or no salt. If you drink alcohol: Limit how much you have to: 0-1 drink a day if you are female. 0-2 drinks a day if you are female. Know how much alcohol is in your drink. In the [place] one drink is one 12 oz bottle of beer (355 mL), one 5 oz glass of wine (148 mL), or one 1 oz glass of hard liquor (44 mL). General information Avoid eating more than 2,300 mg of salt a day. If you have hypertension, you may need to reduce your sodium intake to 1,500 mg a day. Work with your provider to stay at a healthy body weight or lose weight. Ask what  the best weight range is for you. On most days of the week, get at least 30 minutes of exercise that causes your heart to beat faster. This may include walking, swimming, or biking. Work with your provider or dietitian to adjust your eating plan to meet your specific calorie needs. What foods should I eat? Fruits All fresh, dried, or frozen fruit. Canned fruits that are in their natural juice and do not have sugar added to them. Vegetables Fresh or frozen vegetables that are raw, steamed, roasted, or grilled. Low-sodium or reduced-sodium tomato and vegetable juice.  Low-sodium or reduced-sodium tomato sauce and tomato paste. Low-sodium or reduced-sodium canned vegetables. Grains Whole-grain or whole-wheat bread. Whole-grain or whole-wheat pasta. Brown rice. [place]. Bulgur. Whole-grain and low-sodium cereals. Pita bread. Low-fat, low-sodium crackers. Whole-wheat flour tortillas. Meats and other proteins Skinless chicken or [place]. Ground chicken or [place]. Pork with fat trimmed off. Fish and seafood. Egg whites. Dried beans, peas, or lentils. Unsalted nuts, nut butters, and seeds. Unsalted canned beans. Lean cuts of beef with fat trimmed off. Low-sodium, lean precooked or cured meat, such as sausages or meat loaves. Dairy Low-fat (1%) or fat-free (skim) milk. Reduced-fat, low-fat, or fat-free cheeses. Nonfat, low-sodium ricotta or cottage cheese. Low-fat or nonfat yogurt. Low-fat, low-sodium cheese. Fats and oils Soft margarine without trans fats. Vegetable oil. Reduced-fat, low-fat, or light mayonnaise and salad dressings (reduced-sodium). Canola, safflower, olive, avocado, soybean, and sunflower oils. Avocado. Seasonings and condiments Herbs. Spices. Seasoning mixes without salt. Other foods Unsalted popcorn and pretzels. Fat-free sweets. The items listed above may not be all the foods and drinks you can have. Talk to a dietitian to learn more. What foods should I avoid? Fruits Canned fruit in a light or heavy syrup. Fried fruit. Fruit in cream or butter sauce. Vegetables Creamed or fried vegetables. Vegetables in a cheese sauce. Regular canned vegetables that are not marked as low-sodium or reduced-sodium. Regular canned tomato sauce and paste that are not marked as low-sodium or reduced-sodium. Regular tomato and vegetable juices that are not marked as low-sodium or reduced-sodium. Dene. Olives. Grains Baked goods made with fat, such as croissants, muffins, or some breads. Dry pasta or rice meal packs. Meats and other proteins Fatty cuts of  meat. Ribs. Fried meat. [place]. Bologna, salami, and other precooked or cured meats, such as sausages or meat loaves, that are not lean and low in sodium. Fat from the back of a pig (fatback). Bratwurst. Salted nuts and seeds. Canned beans with added salt. Canned or smoked fish. Whole eggs or egg yolks. Chicken or [place] with skin. Dairy Whole or 2% milk, cream, and half-and-half. Whole or full-fat cream cheese. Whole-fat or sweetened yogurt. Full-fat cheese. Nondairy creamers. Whipped toppings. Processed cheese and cheese spreads. Fats and oils Butter. Stick margarine. Lard. Shortening. Ghee. Bacon fat. Tropical oils, such as coconut, palm kernel, or palm oil. Seasonings and condiments Onion salt, garlic salt, seasoned salt, table salt, and sea salt. Worcestershire sauce. Tartar sauce. Barbecue sauce. Teriyaki sauce. Soy sauce, including reduced-sodium soy sauce. Steak sauce. Canned and packaged gravies. Fish sauce. Oyster sauce. Cocktail sauce. Store-bought horseradish. Ketchup. Mustard. Meat flavorings and tenderizers. Bouillon cubes. Hot sauces. Pre-made or packaged marinades. Pre-made or packaged taco seasonings. Relishes. Regular salad dressings. Other foods Salted popcorn and pretzels. The items listed above may not be all the foods and drinks you should avoid. Talk to a dietitian to learn more. Where to find more information National Heart, Lung, and Blood Institute (NHLBI): [employer]DryCleaner.gl American  Heart Association (AHA): heart.org Academy of Nutrition and Dietetics: eatright.org National Kidney Foundation (NKF): kidney.org This information is not intended to replace advice given to you by your health care provider. Make sure you discuss any questions you have with your health care provider. Document Revised: 02/07/2022 Document Reviewed: 02/07/2022 Elsevier Patient Education  2024 [employer].

## 2023-11-03 ENCOUNTER — Ambulatory Visit: Admitting: Nurse Practitioner

## 2024-02-11 ENCOUNTER — Encounter: Payer: Self-pay | Admitting: Hematology and Oncology

## 2024-07-07 ENCOUNTER — Ambulatory Visit: Admitting: Hematology and Oncology
